# Patient Record
Sex: Female | Born: 1980 | Race: Black or African American | Hispanic: No | Marital: Married | Smoking: Never smoker
Health system: Southern US, Community
[De-identification: ages and names within clinical notes are randomized; demographics above are authoritative.]

## PROBLEM LIST (undated history)

## (undated) ENCOUNTER — Inpatient Hospital Stay (HOSPITAL_COMMUNITY): Payer: Self-pay

## (undated) DIAGNOSIS — Z21 Asymptomatic human immunodeficiency virus [HIV] infection status: Secondary | ICD-10-CM

## (undated) DIAGNOSIS — B20 Human immunodeficiency virus [HIV] disease: Secondary | ICD-10-CM

## (undated) HISTORY — PX: CIRCUMCISION: SUR203

---

## 2016-02-14 ENCOUNTER — Encounter: Payer: Self-pay | Admitting: *Deleted

## 2016-02-21 ENCOUNTER — Encounter: Payer: Self-pay | Admitting: Obstetrics & Gynecology

## 2016-02-21 ENCOUNTER — Other Ambulatory Visit: Payer: Self-pay | Admitting: Obstetrics & Gynecology

## 2016-02-21 ENCOUNTER — Ambulatory Visit (INDEPENDENT_AMBULATORY_CARE_PROVIDER_SITE_OTHER): Payer: Self-pay | Admitting: Obstetrics & Gynecology

## 2016-02-21 VITALS — BP 91/69 | HR 101 | Ht 66.0 in | Wt 196.2 lb

## 2016-02-21 DIAGNOSIS — O09299 Supervision of pregnancy with other poor reproductive or obstetric history, unspecified trimester: Secondary | ICD-10-CM | POA: Insufficient documentation

## 2016-02-21 DIAGNOSIS — Z124 Encounter for screening for malignant neoplasm of cervix: Secondary | ICD-10-CM

## 2016-02-21 DIAGNOSIS — O0992 Supervision of high risk pregnancy, unspecified, second trimester: Secondary | ICD-10-CM

## 2016-02-21 DIAGNOSIS — O34219 Maternal care for unspecified type scar from previous cesarean delivery: Secondary | ICD-10-CM

## 2016-02-21 DIAGNOSIS — O0990 Supervision of high risk pregnancy, unspecified, unspecified trimester: Secondary | ICD-10-CM | POA: Insufficient documentation

## 2016-02-21 DIAGNOSIS — Z1151 Encounter for screening for human papillomavirus (HPV): Secondary | ICD-10-CM

## 2016-02-21 DIAGNOSIS — Z113 Encounter for screening for infections with a predominantly sexual mode of transmission: Secondary | ICD-10-CM

## 2016-02-21 DIAGNOSIS — O09529 Supervision of elderly multigravida, unspecified trimester: Secondary | ICD-10-CM | POA: Insufficient documentation

## 2016-02-21 DIAGNOSIS — Z3492 Encounter for supervision of normal pregnancy, unspecified, second trimester: Secondary | ICD-10-CM

## 2016-02-21 DIAGNOSIS — O09292 Supervision of pregnancy with other poor reproductive or obstetric history, second trimester: Secondary | ICD-10-CM

## 2016-02-21 DIAGNOSIS — O09522 Supervision of elderly multigravida, second trimester: Secondary | ICD-10-CM

## 2016-02-21 DIAGNOSIS — Z3482 Encounter for supervision of other normal pregnancy, second trimester: Secondary | ICD-10-CM

## 2016-02-21 LAB — POCT URINALYSIS DIP (DEVICE)
BILIRUBIN URINE: NEGATIVE
GLUCOSE, UA: NEGATIVE mg/dL
Hgb urine dipstick: NEGATIVE
KETONES UR: NEGATIVE mg/dL
Leukocytes, UA: NEGATIVE
NITRITE: NEGATIVE
Protein, ur: NEGATIVE mg/dL
SPECIFIC GRAVITY, URINE: 1.01 (ref 1.005–1.030)
Urobilinogen, UA: 0.2 mg/dL (ref 0.0–1.0)
pH: 7 (ref 5.0–8.0)

## 2016-02-21 NOTE — Progress Notes (Signed)
  Subjective: 3 previous cesarean sections    Terri Padilla is a G5P3010 5256w5d being seen today for her first obstetrical visit.  Her obstetrical history is significant for advanced maternal age and previous cesarean sections, child with Down syndrome. Patient does intend to breast feed. Pregnancy history fully reviewed.  Patient reports no complaints.  Vitals:   02/21/16 1504 02/21/16 1505  BP: 91/69   Pulse: (!) 101   Weight: 196 lb 3.2 oz (89 kg)   Height:  5\' 6"  (1.676 m)    HISTORY: OB History  Gravida Para Term Preterm AB Living  5 3 3   1     SAB TAB Ectopic Multiple Live Births  1            # Outcome Date GA Lbr Len/2nd Weight Sex Delivery Anes PTL Lv  5 Current           4 Term 04/05/15 8024w2d  6 lb 9.8 oz (3 kg) F CS-Unspec        Birth Comments: had a problem with abdomen during pregnancy, baby born with undiagnosed down's syndrome  3 Term 04/15/13 6750w0d  6 lb 6.3 oz (2.9 kg) F CS-Unspec        Birth Comments: no complications  2 Term 10/21/08 [redacted]w[redacted]d  7 lb 7.9 oz (3.4 kg) M CS-Unspec        Birth Comments: no complications except c/s for ftp, and bleeding  1 SAB 2009             No past medical history on file. Past Surgical History:  Procedure Laterality Date  . CESAREAN SECTION     No family history on file.   Exam    Uterus:     Pelvic Exam:    Perineum: No Hemorrhoids   Vulva: normal   Vagina:  normal mucosa   pH:     Cervix: no lesions   Adnexa: not evaluated   Bony Pelvis: average  System: Breast:  normal appearance, no masses or tenderness   Skin: normal coloration and turgor, no rashes    Neurologic: oriented, normal mood   Extremities: normal strength, tone, and muscle mass   HEENT neck supple with midline trachea   Mouth/Teeth Has dental caries   Neck supple   Cardiovascular: regular rate and rhythm, no murmurs or gallops   Respiratory:  appears well, vitals normal, no respiratory distress, acyanotic, normal RR, neck free of  mass or lymphadenopathy, chest clear, no wheezing, crepitations, rhonchi, normal symmetric air entry   Abdomen: soft, non-tender; bowel sounds normal; no masses,  no organomegaly   Urinary: urethral meatus normal      Assessment:    Pregnancy: [id number] Patient Active Problem List   Diagnosis Date Noted  . Supervision of high risk pregnancy, antepartum 02/21/2016  . Multigravida of advanced maternal age 35/04/2016  . Previous cesarean delivery affecting pregnancy, antepartum 02/21/2016  . Down syndrome in child of prior pregnancy, currently pregnant 02/21/2016        Plan:     Initial labs drawn. Prenatal vitamins. Problem list reviewed and updated. Genetic Screening discussed Quad Screen: needs US first, considering genetic testing.  Ultrasound discussed; fetal survey: ordered.  Follow up in 4 weeks. 50% of 30 min visit spent on counseling and coordination of care.  [place] be leaving for [place] 2 months   Makenzey Nanni 02/21/2016

## 2016-02-21 NOTE — Patient Instructions (Signed)

## 2016-02-21 NOTE — Progress Notes (Signed)
Here for first ob visit. Used pacifica interpreter [phone]#[id number]. Given booklets. C/o cramping sometimes.

## 2016-02-22 ENCOUNTER — Telehealth: Payer: Self-pay | Admitting: Infectious Disease

## 2016-02-22 LAB — AFP, QUAD SCREEN
AFP: 67 ng/mL
Age Alone: 1:137 {titer}
CURR GEST AGE: 21.7 wk
Down Syndrome Scr Risk Est: 1:25800 {titer}
HCG, Total: 3.43 IU/mL
INH: 47.2 pg/mL
INTERPRETATION-AFP: NEGATIVE
MOM FOR HCG: 0.2
MoM for AFP: 1.09
MoM for INH: 0.25
OPEN SPINA BIFIDA: NEGATIVE
TRI 18 SCR RISK EST: NEGATIVE
Trisomy 18 (Edward) Syndrome Interp.: 1:326 {titer}
UE3 VALUE: 2.59 ng/mL
uE3 Mom: 1.13

## 2016-02-22 LAB — PRENATAL PROFILE (SOLSTAS)
ANTIBODY SCREEN: NEGATIVE
BASOS ABS: 0 {cells}/uL (ref 0–200)
BASOS PCT: 0 %
EOS ABS: 87 {cells}/uL (ref 15–500)
Eosinophils Relative: 1 %
HEMATOCRIT: 32.2 % — AB (ref 35.0–45.0)
HIV 1&2 Ab, 4th Generation: REACTIVE — AB
Hemoglobin: 10.8 g/dL — ABNORMAL LOW (ref 11.7–15.5)
Hepatitis B Surface Ag: NEGATIVE
Lymphocytes Relative: 21 %
Lymphs Abs: 1827 cells/uL (ref 850–3900)
MCH: 30.3 pg (ref 27.0–33.0)
MCHC: 33.5 g/dL (ref 32.0–36.0)
MCV: 90.2 fL (ref 80.0–100.0)
MONO ABS: 435 {cells}/uL (ref 200–950)
MPV: 10.4 fL (ref 7.5–12.5)
Monocytes Relative: 5 %
NEUTROS ABS: 6351 {cells}/uL (ref 1500–7800)
Neutrophils Relative %: 73 %
PLATELETS: 270 10*3/uL (ref 140–400)
RBC: 3.57 MIL/uL — ABNORMAL LOW (ref 3.80–5.10)
RDW: 15.1 % — ABNORMAL HIGH (ref 11.0–15.0)
RH TYPE: POSITIVE
Rubella: 2.34 Index — ABNORMAL HIGH (ref <0.90)
WBC: 8.7 10*3/uL (ref 3.8–10.8)

## 2016-02-22 LAB — HIV 1/2 CONFIRMATION
HIV 1 ANTIBODY: POSITIVE — AB
HIV 2 AB: NEGATIVE

## 2016-02-22 LAB — GC/CHLAMYDIA PROBE AMP (~~LOC~~) NOT AT ARMC
Chlamydia: NEGATIVE
Neisseria Gonorrhea: NEGATIVE

## 2016-02-22 LAB — CYTOLOGY - PAP

## 2016-02-22 LAB — GLUCOSE TOLERANCE, 1 HOUR (50G) W/O FASTING: Glucose, 1 Hr, gestational: 145 mg/dL — ABNORMAL HIGH (ref <140)

## 2016-02-22 NOTE — Progress Notes (Signed)
   This patient is positive for HIV and apparently pregnant. She should be plugged into our clinic as soon as possible to be started on antiretroviral medications.

## 2016-02-23 LAB — PAIN MGMT, PROFILE 6 CONF W/O MM, U
6 ACETYLMORPHINE: NEGATIVE ng/mL (ref <10)
AMPHETAMINES: NEGATIVE ng/mL (ref <500)
Alcohol Metabolites: NEGATIVE ng/mL (ref <500)
BARBITURATES: NEGATIVE ng/mL (ref <300)
Benzodiazepines: NEGATIVE ng/mL (ref <100)
Cocaine Metabolite: NEGATIVE ng/mL (ref <150)
Creatinine: 51.4 mg/dL (ref >=20.0)
Marijuana Metabolite: NEGATIVE ng/mL (ref <20)
Methadone Metabolite: NEGATIVE ng/mL (ref <100)
OXIDANT: NEGATIVE ug/mL (ref <200)
Opiates: NEGATIVE ng/mL (ref <100)
Oxycodone: NEGATIVE ng/mL (ref <100)
PH: 7.66 (ref 4.5–9.0)
Phencyclidine: NEGATIVE ng/mL (ref <25)
Please note:: 0

## 2016-02-23 LAB — CULTURE, OB URINE

## 2016-02-25 ENCOUNTER — Encounter (HOSPITAL_COMMUNITY): Payer: Self-pay

## 2016-02-25 ENCOUNTER — Encounter: Payer: Self-pay | Admitting: Advanced Practice Midwife

## 2016-02-25 ENCOUNTER — Inpatient Hospital Stay (HOSPITAL_COMMUNITY)
Admission: AD | Admit: 2016-02-25 | Discharge: 2016-02-25 | Disposition: A | Discharge status: Home / Self Care | Payer: Self-pay | Source: Ambulatory Visit | Attending: Obstetrics and Gynecology | Admitting: Obstetrics and Gynecology

## 2016-02-25 ENCOUNTER — Encounter (HOSPITAL_COMMUNITY): Payer: Self-pay | Admitting: *Deleted

## 2016-02-25 DIAGNOSIS — O09522 Supervision of elderly multigravida, second trimester: Secondary | ICD-10-CM | POA: Insufficient documentation

## 2016-02-25 DIAGNOSIS — Z3A22 22 weeks gestation of pregnancy: Secondary | ICD-10-CM | POA: Insufficient documentation

## 2016-02-25 DIAGNOSIS — O26899 Other specified pregnancy related conditions, unspecified trimester: Secondary | ICD-10-CM

## 2016-02-25 DIAGNOSIS — A084 Viral intestinal infection, unspecified: Secondary | ICD-10-CM | POA: Insufficient documentation

## 2016-02-25 DIAGNOSIS — O99612 Diseases of the digestive system complicating pregnancy, second trimester: Secondary | ICD-10-CM | POA: Insufficient documentation

## 2016-02-25 DIAGNOSIS — O26892 Other specified pregnancy related conditions, second trimester: Secondary | ICD-10-CM | POA: Insufficient documentation

## 2016-02-25 DIAGNOSIS — R109 Unspecified abdominal pain: Secondary | ICD-10-CM

## 2016-02-25 DIAGNOSIS — Z21 Asymptomatic human immunodeficiency virus [HIV] infection status: Secondary | ICD-10-CM | POA: Insufficient documentation

## 2016-02-25 DIAGNOSIS — O9989 Other specified diseases and conditions complicating pregnancy, childbirth and the puerperium: Secondary | ICD-10-CM

## 2016-02-25 HISTORY — DX: Asymptomatic human immunodeficiency virus (hiv) infection status: Z21

## 2016-02-25 HISTORY — DX: Human immunodeficiency virus (HIV) disease: B20

## 2016-02-25 LAB — HEMOGLOBINOPATHY EVALUATION
HCT: 32.2 % — ABNORMAL LOW (ref 35.0–45.0)
HGB A2 QUANT: 4.9 % — AB (ref 1.8–3.5)
HGB A: 54.8 % — AB (ref >96.0)
HGB F QUANT: 7.2 % — AB (ref <2.0)
Hemoglobin: 10.8 g/dL — ABNORMAL LOW (ref 11.7–15.5)
Hgb S Quant: 33.1 % — ABNORMAL HIGH
MCH: 30.3 pg (ref 27.0–33.0)
MCV: 90.2 fL (ref 80.0–100.0)
RBC: 3.57 MIL/uL — ABNORMAL LOW (ref 3.80–5.10)
RDW: 15.1 % — ABNORMAL HIGH (ref 11.0–15.0)

## 2016-02-25 LAB — URINE MICROSCOPIC-ADD ON: RBC / HPF: NONE SEEN RBC/hpf (ref 0–5)

## 2016-02-25 LAB — WET PREP, GENITAL
CLUE CELLS WET PREP: NONE SEEN
TRICH WET PREP: NONE SEEN
YEAST WET PREP: NONE SEEN

## 2016-02-25 LAB — URINALYSIS, ROUTINE W REFLEX MICROSCOPIC
BILIRUBIN URINE: NEGATIVE
Glucose, UA: NEGATIVE mg/dL
Ketones, ur: NEGATIVE mg/dL
Leukocytes, UA: NEGATIVE
Nitrite: NEGATIVE
PH: 6.5 (ref 5.0–8.0)
Protein, ur: NEGATIVE mg/dL

## 2016-02-25 LAB — POCT FERN TEST: POCT Fern Test: NEGATIVE

## 2016-02-25 MED ORDER — ACETAMINOPHEN 500 MG PO TABS
1000.0000 mg | ORAL_TABLET | Freq: Once | ORAL | Status: AC
  Administered 2016-02-25: 1000 mg via ORAL
  Filled 2016-02-25: qty 2

## 2016-02-25 NOTE — Discharge Instructions (Signed)
Douleur abdominale pendant la grossesse La douleur abdominale est courante pendant la grossesse. La [employer], cela ne cause aucun dommage. Il existe de nombreuses causes de douleurs abdominales. Certaines causes sont plus graves que [place]. Certaines des causes de la douleur abdominale pendant la grossesse sont faciles  diagnostiquer. Parfois, le diagnostic prend du temps  comprendre. D'autres fois, la cause n'est pas dtermine. La douleur abdominale peut signifier que quelque chose est trs faux avec la grossesse, ou la douleur n'a peut-tre rien  voir avec la [place]. Pour cette raison, dites toujours  votre fournisseur de soins de sant si vous souffrez d'un malaise abdominal. INSTRUCTIONS DE Howell Pringle Surveillez vos douleurs abdominales pour [employer]. Les actions suivantes peuvent aider  attnuer les inconvnients que vous rencontrez: Ne pas avoir de rapports sexuels ou mettre quelque chose dans votre vagin jusqu' ce que vos symptmes disparaissent compltement. Rtablissez beaucoup de repos jusqu' ce que votre douleur s'amliore. Buvez des liquides clairs si vous ressentez des nauses. vitez les aliments solides tant que vous tes mal  l'aise ou  la nause. Ne prenez que des [employer] Stores ordonnance ou des [employer] ordonnance, comme indiqu par votre fournisseur de soins de sant. Conservez tous les rendez-vous de suivi avec votre fournisseur de soins de sant. RECHERCHE SOINS MDICAUX IMMDIATE SI: Vous Jeanine Luz du liquide ou passez du tissu du vagin. Vous avez de plus en plus de douleurs ou de crampes. Vous avez des vomissements persistants. Vous avez une urination douloureuse ou sanglante. Tu as de Social research officer, government. Vous remarquez une diminution des mouvements de votre bb. Vous avez une faiblesse extrme ou vous sentez faibles. Vous avez un essoufflement, avec ou sans douleur abdominale. Vous dveloppez un grave mal de tte avec des Praxair. Vous avez des troubles vaginaux anormaux avec des Weyerhaeuser Company. Vous avez une diarrhe persistante. Vous avez des BorgWarner se poursuivent mme aprs le repos, ou s'aggrave. ASSUREZ-VOUS: Comprenez ces instructions. Surveilleront votre tat. Obtenir de Celanese Corporation de suite si vous ne faites pas bien ou s'aggrave.  Cette information n'est pas destine  remplacer les conseils qui vous ont t fournis par votre fournisseur de soins de sant. Assurez-vous de Scientific laboratory technician de vos questions avec votre fournisseur de soins de sant.  Abdominal Pain During Pregnancy Abdominal pain is common in pregnancy. Most of the time, it does not cause harm. There are many causes of abdominal pain. Some causes are more serious than others. Some of the causes of abdominal pain in pregnancy are easily diagnosed. Occasionally, the diagnosis takes time to understand. Other times, the cause is not determined. Abdominal pain can be a sign that something is very wrong with the pregnancy, or the pain may have nothing to do with the pregnancy at all. For this reason, always tell your health care provider if you have any abdominal discomfort. HOME CARE INSTRUCTIONS  Monitor your abdominal pain for any changes. The following actions may help to alleviate any discomfort you are experiencing:  Do not have sexual intercourse or put anything in your vagina until your symptoms go away completely.  Get plenty of rest until your pain improves.  Drink clear fluids if you feel nauseous. Avoid solid food as long as you are uncomfortable or nauseous.  Only take over-the-counter or prescription medicine as directed by your health care provider.  Keep all follow-up appointments with your health care provider. SEEK IMMEDIATE MEDICAL CARE IF:  You are bleeding, leaking fluid, or passing tissue from the vagina.  You have increasing pain or cramping.    You have persistent vomiting.  You have  painful or bloody urination.  You have a fever.  You notice a decrease in your baby's movements.  You have extreme weakness or feel faint.  You have shortness of breath, with or without abdominal pain.  You develop a severe headache with abdominal pain.  You have abnormal vaginal discharge with abdominal pain.  You have persistent diarrhea.  You have abdominal pain that continues even after rest, or gets worse. MAKE SURE YOU:   Understand these instructions.  Will watch your condition.  Will get help right away if you are not doing well or get worse.   This information is not intended to replace advice given to you by your health care provider. Make sure you discuss any questions you have with your health care provider.   Document Released: 06/30/2005 Document Revised: 04/20/2013 Document Reviewed: 01/27/2013 Elsevier Interactive Patient Education 2016 [employer].   Gastro-entrite Virale La gastro-entrite virale est galement connue sous le nom de grippe gastrique. Cette condition affecte l'estomac et le tractus intestinal. Il peut provoquer une diarrhe soudaine et des vomissements. La maladie dure gnralement de 3  8 jours. La plupart des gens dveloppent une rponse immunitaire qui finit par se Engineering geologist virus. Bien que cette rponse naturelle se dveloppe, le virus peut vous rendre trs [place]. CAUSES De nombreux virus diffrents peuvent causer une gastro-entrite, comme le rotavirus ou les norovirus. Vous pouvez attraper un de ces virus en consommant des aliments ou de l'eau contamins. Vous pouvez galement attraper un virus en partageant des Consolidated Edison ou d'autres objets personnels avec une personne infecte ou en touchant une surface contamine. SYMPTMES Les symptmes les plus courants sont la diarrhe et les vomissements. Ces problmes peuvent entraner une perte grave de liquides corporels (dshydratation) et un dsquilibre du sel corporel (lectrolyte).  D'autres symptmes peuvent inclure: Fivre. Mal de tte. Fatigue. Douleur abdominale. DIAGNOSTIC Votre soignant peut gnralement diagnostiquer une gastro-entrite virale en fonction de vos symptmes et Omnicare. Un chantillon de selles peut galement tre utilis pour tester la prsence de virus ou d'autres infections. TRAITEMENT Cette maladie disparat gnralement seule. Les traitements visent la rhydratation. Les cas les plus graves de gastro-entrite virale impliquent des vomissements si svres que vous ne pouvez pas Fortune Brands liquides. Dans ces cas, les fluides doivent tre administrs par Humana Inc (IV). INSTRUCTIONS DE SOIN Buvez suffisamment de liquides pour garder votre urine claire ou jaune ple. Buvez de petites quantits de liquides frquemment et State Farm quantits tolres. Demandez  votre fournisseur de Clear Channel Communications de rhydratation spcifiques. viter: Engineer, building services. De l'alcool. Boissons gazeuses. Le tabac. Jus. Boissons  la cafine. Des fluides extrmement chauds ou froids. Aliments graisseux et gras. Trop d'ingestion de tout en Performance Food Group. Produits laitiers jusqu' 24  48 heures aprs la cessation de la diarrhe. Vous pouvez consommer des probiotiques. Les probiotiques NVR Inc cultures actives de bactries bnfiques. Ils peuvent diminuer la quantit et le nombre de selles diarrhiques Pathmark Stores. Les probiotiques peuvent tre trouvs American Electric Power des cultures actives et des supplments. Lavez-vous bien les mains pour viter de Ryder System virus. Prenez uniquement des Path[employer] Stores ordonnance ou de prescription pour Liz Claiborne, l'inconfort ou la fivre selon les directives de votre soignant. Ne pas donner d'aspirine CMS Energy Corporation. Les mdicaments antidiarrhiques ne sont pas recommands. Demandez  votre soignant si vous devriez continuer  prendre vos mdicaments rguliers et en  vente  libre. Conservez tous les rendez-vous de suivi comme indiqu par Genuine Partsvotre soignant. RECHERCHE SOINS MDICAUX IMMDIATE SI: Vous ne parvenez pas  rduire les liquides. Vous ne faites pas uriner au moins une fois toutes les 6  8 heures. Vous dveloppez un essoufflement. Vous remarquez du sang dans vos selles ou vomissez. Cela peut ressembler  du caf. Vous avez une douleur abdominale qui augmente ou est concentre dans une petite zone (localise). Vous avez des vomissements persistants ou de la diarrhe. Tu as de Social research officer, governmentla fivre. Le patient est un enfant de moins de 3 mois et il a une fivre. Le patient est un enfant de plus de 3 mois et il a une fivre et des Halliburton Companysymptmes persistants. Le patient est un enfant de plus de 3 mois, et il a une fivre et les symptmes s'aggravent soudainement. Le patient est un bb, et il n'a pas de larmes en pleurant. ASSUREZ-VOUS: Comprenez ces instructions. Surveilleront votre tat. Obtenir de Celanese Corporationl'aide tout de suite si vous ne faites pas bien ou s'aggrave.   Viral Gastroenteritis Viral gastroenteritis is also known as stomach flu. This condition affects the stomach and intestinal tract. It can cause sudden diarrhea and vomiting. The illness typically lasts 3 to 8 days. Most people develop an immune response that eventually gets rid of the virus. While this natural response develops, the virus can make you quite ill. CAUSES  Many different viruses can cause gastroenteritis, such as rotavirus or noroviruses. You can catch one of these viruses by consuming contaminated food or water. You may also catch a virus by sharing utensils or other personal items with an infected person or by touching a contaminated surface. SYMPTOMS  The most common symptoms are diarrhea and vomiting. These problems can cause a severe loss of body fluids (dehydration) and a body salt (electrolyte) imbalance. Other symptoms may include:  Fever.  Headache.  Fatigue.  Abdominal pain. DIAGNOSIS   Your caregiver can usually diagnose viral gastroenteritis based on your symptoms and a physical exam. A stool sample may also be taken to test for the presence of viruses or other infections. TREATMENT  This illness typically goes away on its own. Treatments are aimed at rehydration. The most serious cases of viral gastroenteritis involve vomiting so severely that you are not able to keep fluids down. In these cases, fluids must be given through an intravenous line (IV). HOME CARE INSTRUCTIONS   Drink enough fluids to keep your urine clear or pale yellow. Drink small amounts of fluids frequently and increase the amounts as tolerated.  Ask your caregiver for specific rehydration instructions.  Avoid:  Foods high in sugar.  Alcohol.  Carbonated drinks.  Tobacco.  Juice.  Caffeine drinks.  Extremely hot or cold fluids.  Fatty, greasy foods.  Too much intake of anything at one time.  Dairy products until 24 to 48 hours after diarrhea stops.  You may consume probiotics. Probiotics are active cultures of beneficial bacteria. They may lessen the amount and number of diarrheal stools in adults. Probiotics can be found in yogurt with active cultures and in supplements.  Wash your hands well to avoid spreading the virus.  Only take over-the-counter or prescription medicines for pain, discomfort, or fever as directed by your caregiver. Do not give aspirin to children. Antidiarrheal medicines are not recommended.  Ask your caregiver if you should continue to take your regular prescribed and over-the-counter medicines.  Keep all follow-up appointments as directed by your caregiver. SEEK IMMEDIATE MEDICAL CARE IF:  You are unable to keep fluids down.  You do not urinate at least once every 6 to 8 hours.  You develop shortness of breath.  You notice blood in your stool or vomit. This may look like coffee grounds.  You have abdominal pain that increases or is concentrated in one  small area (localized).  You have persistent vomiting or diarrhea.  You have a fever.  The patient is a child younger than 3 months, and he or she has a fever.  The patient is a child older than 3 months, and he or she has a fever and persistent symptoms.  The patient is a child older than 3 months, and he or she has a fever and symptoms suddenly get worse.  The patient is a baby, and he or she has no tears when crying. MAKE SURE YOU:   Understand these instructions.  Will watch your condition.  Will get help right away if you are not doing well or get worse.   This information is not intended to replace advice given to you by your health care provider. Make sure you discuss any questions you have with your health care provider.   Document Released: 06/30/2005 Document Revised: 09/22/2011 Document Reviewed: 04/16/2011 Elsevier Interactive Patient Education Yahoo[employer] Inc2016 Elsevier Inc.

## 2016-02-25 NOTE — MAU Note (Signed)
Patient states she was awakened by abdominal pain around 0400 with contractions about every 10 minutes.  Denies LOF or vaginal bleeding.  States she has had 3 prior cesarean sections.  No preterm births, no problems with this pregnancy.

## 2016-02-25 NOTE — MAU Provider Note (Signed)
Chief Complaint: Contractions   None     SUBJECTIVE HPI: Terri Padilla is a 35 y.o. [id number] pt of [facility] at 416w2d by LMP with hx significant for C/S x 3 at term and HIV positive who presents to maternity admissions reporting onset of cramping/contractions at 4 am that were 5 minutes, then 3 minutes, then 1 minute apart and progressively more painful. The contractions were associated with some clear fluid leakage, not requiring a pad.  She reports she also had 3-4 episodes of diarrhea yesterday/last night.  The contractions have improved and are 30 minutes apart now while in MAU. She did not try any treatment.  She denies sick contacts.   She denies vaginal bleeding, vaginal itching/burning, urinary symptoms, h/a, dizziness, n/v, or fever/chills.    Pacifica interpreter [place] language used for all communication.  HPI  Past Medical History:  Diagnosis Date  . HIV (human immunodeficiency virus infection) (HCC)    Past Surgical History:  Procedure Laterality Date  . CESAREAN SECTION     Social History   Social History  . Marital status: Single    Spouse name: N/A  . Number of children: N/A  . Years of education: N/A   Occupational History  . Not on file.   Social History Main Topics  . Smoking status: Never Smoker  . Smokeless tobacco: Never Used  . Alcohol use No  . Drug use: No  . Sexual activity: Yes   Other Topics Concern  . Not on file   Social History Narrative  . No narrative on file   No current facility-administered medications on file prior to encounter.    Current Outpatient Prescriptions on File Prior to Encounter  Medication Sig Dispense Refill  . Multiple Vitamin (MULTIVITAMIN) tablet Take 1 tablet by mouth daily. With iron     No Known Allergies  ROS:  Review of Systems  Constitutional: Negative for chills, fatigue and fever.  Respiratory: Negative for shortness of breath.   Cardiovascular: Negative for chest pain.  Gastrointestinal:  Positive for abdominal pain and diarrhea. Negative for nausea and vomiting.  Genitourinary: Positive for pelvic pain and vaginal discharge. Negative for difficulty urinating, dysuria, flank pain, vaginal bleeding and vaginal pain.  Neurological: Negative for dizziness and headaches.  Psychiatric/Behavioral: Negative.      I have reviewed patient's Past Medical Hx, Surgical Hx, Family Hx, Social Hx, medications and allergies.   Physical Exam   Patient Vitals for the past 24 hrs:  BP Temp Temp src Pulse Resp SpO2  02/25/16 0947 119/76 - - 75 16 -  02/25/16 0741 128/74 99 F (37.2 C) Oral 76 16 99 %   Constitutional: Well-developed, well-nourished female in no acute distress.  Cardiovascular: normal rate Respiratory: normal effort GI: Abd soft, non-tender. Pos BS x 4 MS: Extremities nontender, no edema, normal ROM Neurologic: Alert and oriented x 4.  GU: Neg CVAT.  PELVIC EXAM: Cervix pink, visually closed, without lesion, scant white creamy discharge, vaginal walls and external genitalia normal Cervix 0/long/high, firm, posterior  FHT 146 by doppler  LAB RESULTS Results for orders placed or performed during the hospital encounter of 02/25/16 (from the past 24 hour(s))  Urinalysis, Routine w reflex microscopic ([facility])     Status: Abnormal   Collection Time: 02/25/16  7:45 AM  Result Value Ref Range   Color, Urine YELLOW YELLOW   APPearance CLEAR CLEAR   Specific Gravity, Urine <1.005 (L) 1.005 - 1.030   pH 6.5 5.0 - 8.0  Glucose, UA NEGATIVE NEGATIVE mg/dL   Hgb urine dipstick TRACE (A) NEGATIVE   Bilirubin Urine NEGATIVE NEGATIVE   Ketones, ur NEGATIVE NEGATIVE mg/dL   Protein, ur NEGATIVE NEGATIVE mg/dL   Nitrite NEGATIVE NEGATIVE   Leukocytes, UA NEGATIVE NEGATIVE  Urine microscopic-add on     Status: Abnormal   Collection Time: 02/25/16  7:45 AM  Result Value Ref Range   Squamous Epithelial / LPF 0-5 (A) NONE SEEN   WBC, UA 0-5 0 - 5 WBC/hpf   RBC / HPF NONE  SEEN 0 - 5 RBC/hpf   Bacteria, UA RARE (A) NONE SEEN  Wet prep, genital     Status: Abnormal   Collection Time: 02/25/16  8:45 AM  Result Value Ref Range   Yeast Wet Prep HPF POC NONE SEEN NONE SEEN   Trich, Wet Prep NONE SEEN NONE SEEN   Clue Cells Wet Prep HPF POC NONE SEEN NONE SEEN   WBC, Wet Prep HPF POC FEW (A) NONE SEEN   Sperm PRESENT   Fern Test     Status: None   Collection Time: 02/25/16  8:45 AM  Result Value Ref Range   POCT Fern Test Negative = intact amniotic membranes     O/POS/-- (08/10 1551)  IMAGING No results found.  MAU Management/MDM: Ordered labs and reviewed results.  With no evidence of preterm labor and normal FHT by doppler, will treat conservatively as gastroenteritis.  Pt to increase PO fluids, take Imodium OTC as needed. [facility]  Treatments in MAU included Tylenol 1000 mg PO x 1 dose with some improvement in pain. Pt stable at time of discharge.  ASSESSMENT 1. Abdominal pain complicating pregnancy   2. Viral gastroenteritis     PLAN Discharge home   Medication List    TAKE these medications   multivitamin tablet Take 1 tablet by mouth daily. With iron      Follow-up Information    [facility]   Specialty:  Obstetrics and Gynecology Why:  Comme prvu, retournez  MAU au besoin pour [employer] Technologiesles urgences.  As scheduled, return to MAU as needed for emergencies. Contact information: [address] [place] [id number] [phone]          Sharen CounterLisa Leftwich-Kirby Certified Nurse-Midwife 02/25/2016  3:21 PM

## 2016-02-26 ENCOUNTER — Ambulatory Visit (HOSPITAL_COMMUNITY)
Admission: RE | Admit: 2016-02-26 | Discharge: 2016-02-26 | Disposition: A | Discharge status: Home / Self Care | Payer: Self-pay | Source: Ambulatory Visit | Attending: Obstetrics & Gynecology | Admitting: Obstetrics & Gynecology

## 2016-02-26 ENCOUNTER — Telehealth: Payer: Self-pay | Admitting: *Deleted

## 2016-02-26 ENCOUNTER — Other Ambulatory Visit: Payer: Self-pay | Admitting: Obstetrics & Gynecology

## 2016-02-26 ENCOUNTER — Encounter (HOSPITAL_COMMUNITY): Payer: Self-pay

## 2016-02-26 DIAGNOSIS — O98719 Human immunodeficiency virus [HIV] disease complicating pregnancy, unspecified trimester: Secondary | ICD-10-CM

## 2016-02-26 DIAGNOSIS — O98712 Human immunodeficiency virus [HIV] disease complicating pregnancy, second trimester: Secondary | ICD-10-CM

## 2016-02-26 DIAGNOSIS — Z3492 Encounter for supervision of normal pregnancy, unspecified, second trimester: Secondary | ICD-10-CM

## 2016-02-26 DIAGNOSIS — O09522 Supervision of elderly multigravida, second trimester: Secondary | ICD-10-CM

## 2016-02-26 DIAGNOSIS — O09292 Supervision of pregnancy with other poor reproductive or obstetric history, second trimester: Secondary | ICD-10-CM

## 2016-02-26 DIAGNOSIS — Z3A21 21 weeks gestation of pregnancy: Secondary | ICD-10-CM

## 2016-02-26 DIAGNOSIS — O09529 Supervision of elderly multigravida, unspecified trimester: Secondary | ICD-10-CM

## 2016-02-26 LAB — GC/CHLAMYDIA PROBE AMP (~~LOC~~) NOT AT ARMC
CHLAMYDIA, DNA PROBE: NEGATIVE
NEISSERIA GONORRHEA: NEGATIVE

## 2016-02-26 NOTE — Progress Notes (Signed)
Genetic Counseling  High-Risk Gestation Note  Appointment Date:  02/26/2016 Referred By: Adam PhenixArnold, James G, MD Date of Birth:  09/28/1980   Pregnancy History: [id number] Estimated Date of Delivery: 06/28/16 Estimated Gestational Age: 1865w3d Attending: Particia NearingMartha Decker, MD   Ms. Terri PascalJemillah Padilla and her husband were seen for genetic counseling because of a maternal age of 35 y.o..  Language Resources English/French interpreter, Occupational hygienistCelestine, provided interpretation for today's visit.    In summary:  Discussed AMA and associated risk for fetal aneuploidy  Reviewed family history concerns  Couple's previous daughter with trisomy 6821  Family history sickle cell disease  Discussed options for screening  NIPS-declined  Ultrasound-performed today, within normal limits  Discussed diagnostic testing options  Amniocentesis- declined  They were counseled regarding maternal age and the association with risk for chromosome conditions due to nondisjunction with aging of the ova.   We reviewed chromosomes, nondisjunction, and the associated 1 in 141 risk for fetal aneuploidy related to a maternal age of 35 y.o. at 9565w3d gestation. They were counseled that the risk for aneuploidy decreases as gestational age increases, accounting for those pregnancies which spontaneously abort.  We specifically discussed Down syndrome (trisomy 821), trisomies 6313 and 3918, and sex chromosome aneuploidies (47,XXX and 47,XXY) including the common features and prognoses of each.   Both family histories were reviewed and found to be contributory for Down syndrome in the couple's youngest child. Their daughter is 2111 months old, and the patient reported that she was diagnosed postnatally with Trisomy 2621. [place] was [place], and we do not have confirmation of this report. No additional relatives were reported with Down syndrome. The patient reported a paternal first cousin with trisomy 2613 who died at age 90 days. No relatives  were reported with recurrent miscarriage.  We reviewed that literature suggests that the risk of recurrence for a homo/heterotrisomy depends on the maternal age at the index pregnancy, the maternal age at the time of the subsequent pregnancy, and the specific trisomy [place] 2004).  Considering that Ms. Padilla was >35 years old at the time of delivery with her daughter and that she is currently 35 y.o., her adjusted risk for Down syndrome (homotrisomy) in the current pregnancy is ~1 in 160 (1 in 256 x the standard morbidity ratio of 2.1).  The overall risk for any aneuploidy, considering Ms. Padilla's maternal age of 35 and her history of a previous child with Down syndrome, is estimated to be approximately 1.2%.   The patient previously had Quad screening which was within normal range, reducing the risks for Down syndrome from her age related risk to approximately 1 in 25,800. The screen was also within normal range for trisomy 18 (1 in 326) and open neural tube defects (1 in 9860). Screening adjusts the a priori risk to provide a pregnancy specific estimate. It is not diagnostic.    We reviewed available screening options including noninvasive prenatal screening (NIPS)/cell free DNA (cfDNA) screening, and detailed ultrasound. They were counseled that screening tests are used to modify a patient's a priori risk for aneuploidy, typically based on age. This estimate provides a pregnancy specific risk assessment. We reviewed the benefits and limitations of each option. Specifically, we discussed the conditions for which each test screens, the detection rates, and false positive rates of each. They were also counseled regarding diagnostic testing via amniocentesis. We reviewed the approximate 1 in 300-500 risk for complications from amniocentesis, including spontaneous pregnancy loss. We discussed the possible results that the tests  might provide including: positive, negative, unanticipated,  and no result. Finally, they were counseled regarding the cost of each option and potential out of pocket expenses.   Detailed ultrasound was performed today. Visualized fetal anatomy appeared within normal range. Complete ultrasound results reported separately. After consideration of all the options, they declined NIPS and amniocentesis. They understand that screening tests cannot rule out all birth defects or genetic syndromes and stated that they were comfortable with the risk assessment thus far. The patient was advised of this limitation and states she still does not want additional testing at this time.     Family histories were otherwise contributory for a full sibling to the patient who died at 52 years of age due to sickle cell anemia (Hemoglobin SS disease). She reported that her parents are each hemoglobin AS and that she personally is hemoglobin AA. We reviewed the autosomal recessive inheritance of hemoglobinopathies. We discussed that a pregnancy is at risk to inherit a hemoglobinopathy when both parents are carriers of sickle cell trait. When both parents carry Hb AS or other beta globin chain variant, each pregnancy has a 1 in 4 (25)% chance to inherit the hemoglobinopathy. If one parent is a carrier but the other has typical hemoglobin, then the pregnancy would have a 1 in 2 (50%) chance to be a carrier but not be at increased risk for the condition. The father of the pregnancy does not have a family history of sickle cell disease or sickle cell trait, and consanguinity was denied for the couple.   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 We attempted to contact the patient to further discuss this information but have not been successful thus far. It is not known if the father of the pregnancy has had carrier screening. This is available to him via  hemoglobin electrophoresis, if desired. If both parents are identified to be carriers, prenatally diagnosis would be available via amniocentesis. The couple previously expressed that they would not be interested in a prenatal diagnostic procedure for chromosome or genetic conditions given the associated risk for complications. Prior to carrier screening for the father of the pregnancy, his risk to have sickle cell trait would be up to approximately 1 in 6. Thus, the risk for the pregnancy together, prior to screening for him, would be approximately 1 in 24. The couple's three previous reportedly unaffected children together would theoretically reduce this risk. We discussed during today's visit that sickle cell and other hemoglobin variants are included on the newborn screening panel for babies born in the hospital in the [place].    The patient also reported a paternal half-nephew to the father of the pregnancy with intellectual disability. He is in his 3540's and is married with children. He was described to be slower mentally compared to other relatives. No additional relatives were reported with similar features. She was counseled that there are many different causes of intellectual disabilities including environmental, multifactorial, and genetic etiologies.  We discussed that a specific diagnosis for intellectual disability can be determined in approximately 50% of these individuals.  In the remaining 50% of individuals, a diagnosis may never be determined.  Regarding genetic causes, we discussed that chromosome aberrations (aneuploidy, deletions, duplications, insertions, and translocations) are responsible for a small percentage of individuals with intellectual disability.  Many individuals with chromosome aberrations have additional differences, including congenital anomalies or minor dysmorphisms.  Likewise, single gene conditions are the underlying cause of intellectual delay in some families.  We  discussed that many gene conditions have intellectual disability as a feature, but also often include other physical or medical differences.  Given the reported family history, recurrence risk for the current pregnancy is likely low.  We discussed that without more specific information, it is difficult to provide an accurate risk assessment.  Further genetic counseling is warranted if more information is obtained.  Ms. Marijo Quizon denied exposure to environmental toxins or chemical agents. She denied the use of alcohol, tobacco or street drugs. She denied significant viral illnesses during the course of her pregnancy.   I counseled this couple regarding the above risks and available options.  The approximate face-to-face time with the genetic counselor was 45 minutes.  Quinn Plowman, MS,  Certified Genetic Counselor 02/26/2016

## 2016-02-26 NOTE — Telephone Encounter (Addendum)
Per message from Dr. Daiva EvesVan Dam in Infectious Diseases patient new ob labs HIV test came back positive. Needs referral to [facility] D asap to get started on antiretrovirals.  Have asked registars to get patient into clinic asap so one of our providers can discuss these results to her. Have also put in referral to ID , I called to make appointment - but office closed for lunch, will need to call back later today.  I have already sent the referral to ID.

## 2016-02-28 ENCOUNTER — Encounter: Payer: Self-pay | Admitting: Obstetrics & Gynecology

## 2016-02-28 ENCOUNTER — Telehealth (HOSPITAL_COMMUNITY): Payer: Self-pay | Admitting: MS"

## 2016-02-28 DIAGNOSIS — Z3A22 22 weeks gestation of pregnancy: Secondary | ICD-10-CM | POA: Insufficient documentation

## 2016-02-28 NOTE — Telephone Encounter (Signed)
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 Left message through French/English interpreter [phone] via [facility] [employer] for patient to return call.   Terri BraunKaren Daniele Yankowski 02/28/2016 9:57 AM

## 2016-02-29 NOTE — Telephone Encounter (Addendum)
I called Billie RuddyJamilah with [employer] [phone] and notified her of appointment Monday at 1:20. She voices understanding.   I also called [facility] at [phone] and left a message I am calling with a referral - we are closed until Monday - please call us Monday and ask for Bonita QuinLinda or Tresa EndoKelly re: referral.

## 2016-03-03 ENCOUNTER — Encounter: Payer: Self-pay | Admitting: Obstetrics and Gynecology

## 2016-03-04 ENCOUNTER — Ambulatory Visit: Payer: Self-pay | Admitting: Pharmacist

## 2016-03-04 ENCOUNTER — Ambulatory Visit: Payer: Self-pay | Admitting: *Deleted

## 2016-03-04 ENCOUNTER — Ambulatory Visit (INDEPENDENT_AMBULATORY_CARE_PROVIDER_SITE_OTHER): Payer: Self-pay | Admitting: Advanced Practice Midwife

## 2016-03-04 ENCOUNTER — Telehealth: Payer: Self-pay

## 2016-03-04 ENCOUNTER — Other Ambulatory Visit (INDEPENDENT_AMBULATORY_CARE_PROVIDER_SITE_OTHER): Payer: Self-pay

## 2016-03-04 ENCOUNTER — Telehealth (HOSPITAL_COMMUNITY): Payer: Self-pay

## 2016-03-04 VITALS — BP 107/53 | HR 86 | Wt 194.3 lb

## 2016-03-04 DIAGNOSIS — Z113 Encounter for screening for infections with a predominantly sexual mode of transmission: Secondary | ICD-10-CM

## 2016-03-04 DIAGNOSIS — B20 Human immunodeficiency virus [HIV] disease: Secondary | ICD-10-CM

## 2016-03-04 DIAGNOSIS — O09522 Supervision of elderly multigravida, second trimester: Secondary | ICD-10-CM

## 2016-03-04 DIAGNOSIS — Z21 Asymptomatic human immunodeficiency virus [HIV] infection status: Secondary | ICD-10-CM

## 2016-03-04 DIAGNOSIS — Z3490 Encounter for supervision of normal pregnancy, unspecified, unspecified trimester: Secondary | ICD-10-CM

## 2016-03-04 DIAGNOSIS — O98712 Human immunodeficiency virus [HIV] disease complicating pregnancy, second trimester: Secondary | ICD-10-CM

## 2016-03-04 DIAGNOSIS — IMO0002 Reserved for concepts with insufficient information to code with codable children: Secondary | ICD-10-CM

## 2016-03-04 DIAGNOSIS — O98719 Human immunodeficiency virus [HIV] disease complicating pregnancy, unspecified trimester: Secondary | ICD-10-CM

## 2016-03-04 DIAGNOSIS — O34219 Maternal care for unspecified type scar from previous cesarean delivery: Secondary | ICD-10-CM

## 2016-03-04 DIAGNOSIS — R896 Abnormal cytological findings in specimens from other organs, systems and tissues: Secondary | ICD-10-CM

## 2016-03-04 DIAGNOSIS — O0992 Supervision of high risk pregnancy, unspecified, second trimester: Secondary | ICD-10-CM

## 2016-03-04 DIAGNOSIS — Z79899 Other long term (current) drug therapy: Secondary | ICD-10-CM

## 2016-03-04 LAB — CBC WITH DIFFERENTIAL/PLATELET
BASOS PCT: 0 %
Basophils Absolute: 0 cells/uL (ref 0–200)
Eosinophils Absolute: 78 cells/uL (ref 15–500)
Eosinophils Relative: 1 %
HCT: 32.6 % — ABNORMAL LOW (ref 35.0–45.0)
Hemoglobin: 10.8 g/dL — ABNORMAL LOW (ref 11.7–15.5)
LYMPHS PCT: 34 %
Lymphs Abs: 2652 cells/uL (ref 850–3900)
MCH: 30.3 pg (ref 27.0–33.0)
MCHC: 33.1 g/dL (ref 32.0–36.0)
MCV: 91.6 fL (ref 80.0–100.0)
MONOS PCT: 7 %
MPV: 10.1 fL (ref 7.5–12.5)
Monocytes Absolute: 546 cells/uL (ref 200–950)
NEUTROS PCT: 58 %
Neutro Abs: 4524 cells/uL (ref 1500–7800)
PLATELETS: 271 10*3/uL (ref 140–400)
RBC: 3.56 MIL/uL — AB (ref 3.80–5.10)
RDW: 15.1 % — AB (ref 11.0–15.0)
WBC: 7.8 10*3/uL (ref 3.8–10.8)

## 2016-03-04 MED ORDER — ATAZANAVIR SULFATE 200 MG PO CAPS: 400.0000 mg | ORAL_CAPSULE | Freq: Every day | ORAL | 8 refills | Status: DC

## 2016-03-04 MED ORDER — RITONAVIR 100 MG PO TABS: 100.0000 mg | ORAL_TABLET | Freq: Every day | ORAL | 11 refills | Status: DC

## 2016-03-04 MED ORDER — EMTRICITABINE-TENOFOVIR AF 200-25 MG PO TABS: 1.0000 | ORAL_TABLET | Freq: Every day | ORAL | 8 refills | Status: DC

## 2016-03-04 NOTE — Telephone Encounter (Signed)
Husband has expressed concern regarding their three children and himself for HIV testing.  I advised patient we should be able to arrange testing for the family through the [facility].    Laurell Josephsammy K Eulla Kochanowski, RN

## 2016-03-04 NOTE — Telephone Encounter (Signed)
[facility] referral int he work que since 02-26-16.  Patient is pregnant and recently diagnosed with HIV. She needs office visit.  I spoke with patient's husband and she can come today for labs an on 03-10-16 for office visit with Dr Daiva EvesVan Dam.   Advised him to call is she is not able to make appointments and expressed the importance of keeping the appointment.   Laurell Josephsammy K Eulalah Rupert, RN

## 2016-03-04 NOTE — Progress Notes (Signed)
Pt's husband requests that he interprets for the pt.  The husband is not present in office but is on pt's speaker phone.  Pt and husband are not wanting to use medical provided interpretation due to the their information wanting to be just between husband and wife.  I advised pt that he would need to come in to sign the waiver of free interpretation form.  Pt's husband stated that he would come in some time this week to sign the form.  Both are aware that an interpreter will have to be present in order to sign the form.

## 2016-03-04 NOTE — Progress Notes (Signed)
  Subjective:  Terri Padilla is a 35 y.o. [id number] at 6361w3d being seen today for ongoing prenatal care.  She is currently monitored for the following issues for this high-risk pregnancy and has Supervision of high risk pregnancy, antepartum; Advanced maternal age in multigravida; Previous cesarean delivery affecting pregnancy, antepartum; Down syndrome in child of prior pregnancy, currently pregnant; and [redacted] weeks gestation of pregnancy on her problem list.  Patient reports no complaints.  Contractions: Not present.  .  Movement: Present. Denies leaking of fluid.   The following portions of the patient's history were reviewed and updated as appropriate: allergies, current medications, past family history, past medical history, past social history, past surgical history and problem list. Problem list updated.  Objective:   Vitals:   03/04/16 0923  BP: (!) 107/53  Pulse: 86  Weight: 194 lb 4.8 oz (88.1 kg)    Fetal Status:     Movement: Present     General:  Alert, oriented and cooperative. Patient is in no acute distress.  Skin: Skin is warm and dry. No rash noted.   Cardiovascular: Normal heart rate noted  Respiratory: Normal respiratory effort, no problems with respiration noted  Abdomen: Soft, gravid, appropriate for gestational age. Pain/Pressure: Present     Pelvic:  Cervical exam deferred        Extremities: Normal range of motion.  Edema: None  Mental Status: Normal mood and affect. Normal behavior. Normal judgment and thought content.   Urinalysis:      Assessment and Plan:  Pregnancy: [id number] at 5961w3d  1. HIV disease affecting pregnancy -ID unable to reach pt/language barrier so appointment made today in office.  Presented results using husband as interpreter on the phone per pt preference.  She declined use of video or language line.  Pt and husband appeared aware of diagnosis but wanted more information.  Appt made with ID next week to discuss treatment.  Discussed  options for husband to be tested including [facility] and resources given to find primary care provider.  2. Supervision of high risk pregnancy, antepartum, second trimester --1 hour glucose result elevated, appt made for 3 hour glucose test  3. Previous cesarean delivery affecting pregnancy, antepartum   4. Abnormal Pap smear of vagina and vaginal HPV --ASCUS with positive HR HPV.  Appt made for colposcopy.  Preterm labor symptoms and general obstetric precautions including but not limited to vaginal bleeding, contractions, leaking of fluid and fetal movement were reviewed in detail with the patient. Please refer to After Visit Summary for other counseling recommendations.  Return for colpo/3 hour GTT.   Hurshel PartyLisa A Leftwich-Kirby, CNM

## 2016-03-04 NOTE — Telephone Encounter (Signed)
I have called ID again to follow up on referral for this patient.. I had to leave a message with Selena BattenKim who handled all referrals. I will follow up later on today to make sure patient has been schedule for an appointment.

## 2016-03-04 NOTE — Patient Instructions (Signed)
[place] Resource Guide °(Revised August 2014) ° ° °Chronic Pain Problems:  °• [facility]:  [phone]  °         Patients need to be referred by their primary care doctor/specialist ° °Insufficient Money for Medicine:  °·         United Way: call "211"   °• MAP Program at [facility] [place] [phone] or [place] [phone] °          ° °No Primary Care Doctor:  °To locate a primary care doctor that accepts your insurance or provides certain services:  °·         [facility]: 832-8000  °·         Physician Referral Service: 1-800-533-3463 ask for “My [place]” °• If no insurance, you need to see if you qualify for GCCN “orange card”, call to set      up appointment for eligibility/enrollment at [phone] or [phone] or visit [place] [facility] ([address], [place] and [address] -[place]) to meet with a GCCN enrollment specialist. ° °Agencies that provide inexpensive (sliding fee scale) medical care:  ° °•    [facility] [place] - [phone] °•    [facility]  -  [facility] [place] [facility] - [phone] °•    [facility] - [phone] °•    [facility] - [phone] °•    [facility] - [phone] °•    [facility] - [phone] °• [facility] - [facility] [place] - [phone]     [phone] °• [facility] [place] - [phone] °• [facility]: [phone]  °• [facility]: [phone]  °• [facility]: [phone]  °• [facility] - 336- [phone] ° ° ° °Medicaid-accepting [place] Providers:  °·         [facility] - [phone] (No Family Planning accepted) °·         [address], 641-2100, Mon-Fri 9am-5pm °·          [facility] - [phone] °• [address], [address], Mon-Thursday 8am-5pm, Fri 8am-noon °• [facility] - [phone] °·         [address], [address], Mon-Fri 7:30am-4:30pm °·         [facility] - [phone] °·         [address] [place] [address], Mon-Fri 8am-5pm  °·        [facility] - [phone] °·         [address] °·         Only accepts [place] Access Medicaid patients after they have their name applied to their card ° °Self Pay (no insurance) in [place]:  °         Sickle Cell Patients:  °• [address], [phone] °[facility]: °• [address], [place] [phone] ° °     [facility] °• [address], [place] [phone] ° ° Family Practice: °• [address], [phone] °           [facility]  °·         [address], [phone] °[facility] °• [address], [phone]) [phone] °          [facility]  °·         [address],  [phone] °       [facility] - [address]  °·         641-2100, Mon-Fri 9am-7pm, Sat 9am-1pm °         [facility] [place] °·         [address] [place] [address], [phone] °         [facility] [place]  °·         [address], [phone] °[facility] [place] °• [address], [place] [phone] °         [facility]  °·         [address] [place] [address], [phone] ° [facility]  °• [address], [phone] °[facility] °• [address], [phone]) [phone] ° Dr. Osei-Bonsu  °·         [address], [place],  [phone] °         [facility]  °·         [address], [phone] °         [facility]  °·           [address], [phone] °         [facility]  °·         [address], [phone], 1st & 3rd Saturday every month, 10am-1pm ° °OTHERS:  [facility]  ([facility] Only)  [phone] (Thursday only) ° °Strategies for finding a Primary Care Provider:  °1) Find a Doctor and Pay Out of Pocket  °Although you won't have to find out who is covered by your insurance plan, it is a good idea to ask around and get recommendations. You will then need to call the office and see if the doctor you have chosen will accept you as a new patient and what types of options they offer for patients who are self-pay. Some doctors offer discounts or will set up payment plans for their patients who do not have insurance, but you will need to ask so you aren't surprised when you get to your appointment.  °2) Contact [facility] - To see if you qualify for “orange card” access to healthcare safety net providers.  Call for appointment for eligibility/enrollment at [phone] or [phone]. (Uninsured, 0-200% FPL, qualifying info)  °Applicants for GCCN are first required to see if they are eligible to enroll in the ACA Marketplace before enrolling in GCCN (and get an exemption if they are not).  °GCCN Criteria for acceptance is:  °? Proof of ACA Marketing exemption - form or documentation  °? Valid photo ID (driver's license, state identification card, passport, home country ID)  °? Proof of [place] residency (e.g. driver’s license, lease/landlord information, pay stubs with address, utility   bill, bank statement, etc.)  ? Proof of income (1040, last year's tax return, W2, 4 current pay stubs, other income proof)  ? Proof of assets (current bank statement + 3 most recent, disability paperwork, life insurance info, tax value on autos, etc.)  3)  Contact Your Local Health Department  Not all health departments have doctors that can see patients for sick visits, but many do, so it is worth a call to see if yours does. If you don't know where your local health department is, you can check in your phone book. The CDC also has a tool to help you locate your state's health department, and many state websites also have listings of all of their local health departments.  4) Find a Walk-in Clinic  If your illness is not likely to be very severe or complicated, you may want to try a walk in clinic. These are popping up all over the country in pharmacies, drugstores, and shopping centers. They're usually staffed by nurse practitioners or physician assistants that have been trained to treat common illnesses and complaints. They're usually fairly quick and inexpensive. However, if you have serious medical issues or chronic medical problems, these are probably not your best option   STD Testing:           [facility] [place]           [address], [place], phone [phone] or [phone]           Monday - Friday, call for an appointment          [facility] [place] [place] [facility]           [address], [place], phone [phone] or [phone]           Monday - Friday, call for an appointment

## 2016-03-04 NOTE — Telephone Encounter (Signed)
Patient has been scheduled for appointment with ID on 03/10/2016 at 9:45. Patient has been given information and directions to her appointment.

## 2016-03-04 NOTE — Progress Notes (Signed)
HPI: Terri Padilla is a 35 y.o. female who presents to the [facility] today as a [facility].  She is pregnant and a new diagnosis of HIV.  Allergies: No Known Allergies  Past Medical History: Past Medical History:  Diagnosis Date  . HIV (human immunodeficiency virus infection) (HCC)     Social History: Social History   Social History  . Marital status: Single    Spouse name: N/A  . Number of children: N/A  . Years of education: N/A   Social History Main Topics  . Smoking status: Never Smoker  . Smokeless tobacco: Never Used  . Alcohol use No  . Drug use: No  . Sexual activity: Yes   Other Topics Concern  . Not on file   Social History Narrative  . No narrative on file    Current Regimen: None  Labs: Hepatitis B Surface Ag (no units)  Date Value  02/21/2016 NEGATIVE    CrCl: CrCl cannot be calculated (No order found.).  Lipids: No results found for: CHOL, TRIG, HDL, CHOLHDL, VLDL, LDLCALC  Assessment: Terri Padilla is here today for labs, [employer] [employer], and medications.  I spoke with Dr. Drue Padilla, and we will start the patient on boosted atazanavir and descovy.  I explained to her how to take this medication with the help of Dr. Drue Padilla (who spoke [place]) and her husband translating.  I instructed her to take all of the medications at dinner -  1 tablet of the descovy, 2 capsules of the atazanavir and 1 tablet of the ritonavir. Her husband expressed concerns for getting himself and their three children tested for HIV. Terri Padilla spoke with the husband, and we will arrange testing through the health department.   Plans: - Descovy 200-25 mg PO once daily - Atazanavir 200 mg capsules - take 2 capsules once daily with food - Ritonavir 100 mg PO once daily with food  Terri Padilla, BS, PharmD Infectious Diseases Clinical Pharmacist [facility] 03/04/2016, 1:23 PM

## 2016-03-04 NOTE — Telephone Encounter (Signed)
[facility] asking for demographic information. Information provided

## 2016-03-05 LAB — URINALYSIS
Bilirubin Urine: NEGATIVE
GLUCOSE, UA: NEGATIVE
HGB URINE DIPSTICK: NEGATIVE
Ketones, ur: NEGATIVE
NITRITE: NEGATIVE
PH: 6.5 (ref 5.0–8.0)
Protein, ur: NEGATIVE
Specific Gravity, Urine: 1.024 (ref 1.001–1.035)

## 2016-03-05 LAB — T-HELPER CELL (CD4) - (RCID CLINIC ONLY)
CD4 T CELL ABS: 910 /uL (ref 400–2700)
CD4 T CELL HELPER: 34 % (ref 33–55)

## 2016-03-05 LAB — COMPLETE METABOLIC PANEL WITH GFR
ALBUMIN: 3.1 g/dL — AB (ref 3.6–5.1)
ALK PHOS: 35 U/L (ref 33–115)
ALT: 10 U/L (ref 6–29)
AST: 11 U/L (ref 10–30)
BILIRUBIN TOTAL: 0.3 mg/dL (ref 0.2–1.2)
BUN: 5 mg/dL — ABNORMAL LOW (ref 7–25)
CO2: 21 mmol/L (ref 20–31)
Calcium: 7.9 mg/dL — ABNORMAL LOW (ref 8.6–10.2)
Chloride: 106 mmol/L (ref 98–110)
Creat: 0.46 mg/dL — ABNORMAL LOW (ref 0.50–1.10)
GFR, Est Non African American: >89 mL/min (ref >=60)
Glucose, Bld: 78 mg/dL (ref 65–99)
POTASSIUM: 4.1 mmol/L (ref 3.5–5.3)
SODIUM: 134 mmol/L — AB (ref 135–146)
TOTAL PROTEIN: 7.4 g/dL (ref 6.1–8.1)

## 2016-03-05 LAB — URINE CYTOLOGY ANCILLARY ONLY
Chlamydia: NEGATIVE
NEISSERIA GONORRHEA: NEGATIVE

## 2016-03-05 LAB — RPR

## 2016-03-05 LAB — HEPATITIS B SURFACE ANTIGEN: HEP B S AG: NEGATIVE

## 2016-03-05 LAB — LIPID PANEL
CHOL/HDL RATIO: 2.1 ratio (ref <=5.0)
Cholesterol: 120 mg/dL — ABNORMAL LOW (ref 125–200)
HDL: 57 mg/dL (ref >=46)
LDL CALC: 24 mg/dL (ref <130)
Triglycerides: 197 mg/dL — ABNORMAL HIGH (ref <150)
VLDL: 39 mg/dL — AB (ref <30)

## 2016-03-05 LAB — HEPATITIS A ANTIBODY, TOTAL: Hep A Total Ab: REACTIVE — AB

## 2016-03-05 LAB — HEPATITIS B SURFACE ANTIBODY,QUALITATIVE: HEP B S AB: NEGATIVE

## 2016-03-05 LAB — HEPATITIS B CORE ANTIBODY, TOTAL: HEP B C TOTAL AB: NONREACTIVE

## 2016-03-05 LAB — HEPATITIS C ANTIBODY: HCV Ab: NEGATIVE

## 2016-03-06 ENCOUNTER — Telehealth: Payer: Self-pay | Admitting: General Practice

## 2016-03-06 ENCOUNTER — Telehealth: Payer: Self-pay

## 2016-03-06 DIAGNOSIS — O2342 Unspecified infection of urinary tract in pregnancy, second trimester: Principal | ICD-10-CM

## 2016-03-06 DIAGNOSIS — B951 Streptococcus, group B, as the cause of diseases classified elsewhere: Secondary | ICD-10-CM

## 2016-03-06 LAB — QUANTIFERON TB GOLD ASSAY (BLOOD)
Interferon Gamma Release Assay: NEGATIVE
MITOGEN-NIL SO: 5.69 [IU]/mL
Quantiferon Nil Value: 0.05 IU/mL
Quantiferon Tb Ag Minus Nil Value: 0 IU/mL

## 2016-03-06 LAB — HIV-1 RNA ULTRAQUANT REFLEX TO GENTYP+
HIV 1 RNA QUANT: 1388 {copies}/mL — AB (ref <20)
HIV-1 RNA QUANT, LOG: 3.14 {Log_copies}/mL — AB (ref <1.30)

## 2016-03-06 MED ORDER — AMPICILLIN 500 MG PO CAPS: 500.0000 mg | ORAL_CAPSULE | Freq: Three times a day (TID) | ORAL | 0 refills | Status: AC

## 2016-03-06 NOTE — Telephone Encounter (Signed)
I contacted Terri Padilla , Social Worker with the [facility] to inform her of a recent referral from [facility].  She is from [place] and is [redacted] weeks pregnant and recently diagnosed with HIV.    She has three children ages, 147, 325 and 6911 months old. (ages may not be exact ) The medical history of the children is not clear when explained by the patient and her husband. They assume the children have been tested but they are not sure.  The two oldest children were born in [place] and the 1611 month old was born in [place].  The 5511 month female was born with Downs syndrome and the Mother states she had lots of testing.  The Mother does not have any medical records of previous OB care and gave no history of the children receiving care while in the [place].    Currently her pregnancy medicaid is pending so we have requested [name] for assistance with HIV medications.  The family will be returning to [place] on March 21, [place].   I spoke with patients husband and he has not been specific with information related to [facility] or the children's medical history.  As for HIV status it is unclear if they were aware prior [facility] or not.  The story seems to change base on information from husband.  The Health Department will follow up with the family and decide if testing the children is necessary.   Laurell Josephsammy K King, RN

## 2016-03-06 NOTE — Telephone Encounter (Signed)
Per Dr Debroah LoopArnold, patient has GBS UTI & needs ampicillin antibiotic sent to pharmacy. Patient also needs 3 hr gtt. Called patient with pacific interpreter [phone], no answer- left message to have patient call us back for results. Patient needs ampicillin 500mg  TID x 7. Attempted to order med but there is no pharmacy in the system

## 2016-03-10 ENCOUNTER — Encounter: Payer: Self-pay | Admitting: Infectious Disease

## 2016-03-10 ENCOUNTER — Ambulatory Visit: Payer: Self-pay | Admitting: Infectious Disease

## 2016-03-11 ENCOUNTER — Other Ambulatory Visit: Payer: Self-pay | Admitting: *Deleted

## 2016-03-11 ENCOUNTER — Telehealth: Payer: Self-pay

## 2016-03-11 DIAGNOSIS — B20 Human immunodeficiency virus [HIV] disease: Secondary | ICD-10-CM

## 2016-03-11 MED ORDER — RITONAVIR 100 MG PO TABS: 100.0000 mg | ORAL_TABLET | Freq: Every day | ORAL | 8 refills | Status: DC

## 2016-03-11 MED ORDER — EMTRICITABINE-TENOFOVIR AF 200-25 MG PO TABS: 1.0000 | ORAL_TABLET | Freq: Every day | ORAL | 8 refills | Status: DC

## 2016-03-11 MED ORDER — ATAZANAVIR SULFATE 200 MG PO CAPS: 400.0000 mg | ORAL_CAPSULE | Freq: Every day | ORAL | 8 refills | Status: DC

## 2016-03-11 MED ORDER — RITONAVIR 100 MG PO TABS: 100.0000 mg | ORAL_TABLET | Freq: Every day | ORAL | 11 refills | Status: DC

## 2016-03-11 NOTE — Addendum Note (Signed)
Addended by: Jennet MaduroESTRIDGE, DENISE D on: 03/11/2016 04:57 PM   Modules accepted: Orders

## 2016-03-11 NOTE — Telephone Encounter (Signed)
Emergency ADAP approved : Case # [id number] I will I inform patient to pick up medications.  Phone call via [employer] Interpreter for french .  Message stated : medications sent to pharmacy . Please pick up today.   Laurell Josephsammy K Tarri Guilfoil, RN

## 2016-03-12 ENCOUNTER — Ambulatory Visit (INDEPENDENT_AMBULATORY_CARE_PROVIDER_SITE_OTHER): Payer: Self-pay | Admitting: Internal Medicine

## 2016-03-12 ENCOUNTER — Ambulatory Visit: Payer: Self-pay | Admitting: *Deleted

## 2016-03-12 ENCOUNTER — Encounter: Payer: Self-pay | Admitting: Internal Medicine

## 2016-03-12 VITALS — BP 112/63 | HR 88 | Temp 98.1°F | Wt 196.0 lb

## 2016-03-12 DIAGNOSIS — R896 Abnormal cytological findings in specimens from other organs, systems and tissues: Secondary | ICD-10-CM

## 2016-03-12 DIAGNOSIS — B20 Human immunodeficiency virus [HIV] disease: Secondary | ICD-10-CM

## 2016-03-12 DIAGNOSIS — K429 Umbilical hernia without obstruction or gangrene: Secondary | ICD-10-CM | POA: Insufficient documentation

## 2016-03-12 DIAGNOSIS — Z3490 Encounter for supervision of normal pregnancy, unspecified, unspecified trimester: Secondary | ICD-10-CM

## 2016-03-12 DIAGNOSIS — Z331 Pregnant state, incidental: Secondary | ICD-10-CM

## 2016-03-12 DIAGNOSIS — N879 Dysplasia of cervix uteri, unspecified: Secondary | ICD-10-CM | POA: Insufficient documentation

## 2016-03-12 DIAGNOSIS — K439 Ventral hernia without obstruction or gangrene: Secondary | ICD-10-CM

## 2016-03-12 DIAGNOSIS — IMO0002 Reserved for concepts with insufficient information to code with codable children: Secondary | ICD-10-CM

## 2016-03-12 DIAGNOSIS — O98719 Human immunodeficiency virus [HIV] disease complicating pregnancy, unspecified trimester: Secondary | ICD-10-CM | POA: Insufficient documentation

## 2016-03-12 LAB — HLA B*5701: HLA-B 5701 W/RFLX HLA-B HIGH: NEGATIVE

## 2016-03-12 MED ORDER — ONDANSETRON 8 MG PO TBDP: 8.0000 mg | ORAL_TABLET | Freq: Three times a day (TID) | ORAL | 0 refills | Status: DC | PRN

## 2016-03-12 NOTE — Progress Notes (Signed)
RFV: hiv + diagnosis during pregnancy  Patient ID: Terri Padilla, female   DOB: 07/10/1981, 35 y.o.   MRN: [id number]  HPI Terri Padilla is a 35yo [place] speaking female originally from [place]. She has been in [place] for the past 5 months. She is [id number] @ 24 wk4d gestation. She is s/p c-section x 3. She has a 507 yo son born (birthed in [place]), 464 yo daughter( birthed in [place]), 6811 month daughter ( trisomy 7521)- (birthed in [place]). She has been in [place] for the past 5 months. She was diagnosed with HIV disease at her prenatal visit this month. CD 4 count of 910/VL 1, 388. ART naive. She started into care on August 1st. She states that she had been taking vitamins ( though not prenatal) thus far. She denies any other sexual contact other than her husband, who has tested negative by antibody, and viral RNA testing. She last tested negative 7 years ago during pregnancy with her son in [place] but is unsure if she had been tested during her daughters' birth. She reports that she had received a blood transfusion during the birth of her 35 year old daughter after her section for anemia in [place]. No illicit drug use or tattoos. She denies sexual violence.  At this visit, I spoke [place] to the patient, while her husband was on the phone- which she and he agreed to disclosing information about testing. I explained to her that her husband's blood test were negative for HIV. We are unable to tell her how long she may have been infected and by what means (? Transfusion). She understood how hiv transmission occurs, how we plan to keep her on HIV treatment from now on especially important during her pregnancy. Also discussed that she will not be able to breastfeed her newborn. Newborn to be on medication for the first month of life.  Her 6611 month old female infant has down's syndrome, with developmental delay, did not require any heart surgery. She had breastfed her daughter until she became pregnant  again.  Other past med hx includes: abd pap screen with ASCUS + HPV. 3 c-section. Left foot surgery for removal of cyst. Right sided abdominal wall hernia since her 3rd c-section.  She has just been approved for [facility] subsidization of HIV medication, plan to start today  Her husband has tested negative, recommended repeat testing in 6 wk. He is interested in tr[place]ada PrEP Her [place] as a Naval architecttruck driver, provides for his family.   Children will be tested by the health department on 8/31  She will return back to [place] on Monday Sep 4 th for up to 2 months, due to visitor visa regulations, with plan to return to follow up with her ob care  No Known Allergies   Outpatient Encounter Prescriptions as of 03/12/2016  Medication Sig  . ampicillin (PRINCIPEN) 500 MG capsule Take 1 capsule (500 mg total) by mouth 3 (three) times daily.  Marland Kitchen. atazanavir (REYATAZ) 200 MG capsule Take 2 capsules (400 mg total) by mouth daily with breakfast.  . emtricitabine-tenofovir AF (DESCOVY) 200-25 MG tablet Take 1 tablet by mouth daily.  . Multiple Vitamin (MULTIVITAMIN) tablet Take 1 tablet by mouth daily. With iron  . ritonavir (NORVIR) 100 MG TABS tablet Take 1 tablet (100 mg total) by mouth daily.  . ondansetron (ZOFRAN ODT) 8 MG disintegrating tablet Take 1 tablet (8 mg total) by mouth every 8 (eight) hours as needed for nausea or vomiting.  No facility-administered encounter medications on file as of 03/12/2016.      Patient Active Problem List   Diagnosis Date Noted  . [redacted] weeks gestation of pregnancy   . Supervision of high risk pregnancy, antepartum 02/21/2016  . Advanced maternal age in multigravida 02/21/2016  . Previous cesarean delivery affecting pregnancy, antepartum 02/21/2016  . Down syndrome in child of prior pregnancy, currently pregnant 02/21/2016     Health Maintenance Due  Topic Date Due  . TETANUS/TDAP  08/15/1999  . INFLUENZA VACCINE  02/12/2016    Social  History  Substance Use Topics  . Smoking status: Never Smoker  . Smokeless tobacco: Never Used  . Alcohol use No   No family history of hiv disease  Review of Systems   Constitutional: Negative for fever, chills, diaphoresis, activity change, appetite change, fatigue and unexpected weight change.  HENT: Negative for congestion, sore throat, rhinorrhea, sneezing, trouble swallowing and sinus pressure.  Eyes: Negative for photophobia and visual disturbance.  Respiratory: Negative for cough, chest tightness, shortness of breath, wheezing and stridor.  Cardiovascular: Negative for chest pain, palpitations and leg swelling.  Gastrointestinal: Negative for nausea, vomiting, abdominal pain, diarrhea, constipation, blood in stool, abdominal distention and anal bleeding.  Genitourinary: Negative for dysuria, hematuria, flank pain and difficulty urinating.  Musculoskeletal: Negative for myalgias, back pain, joint swelling, arthralgias and gait problem.  Skin: Negative for color change, pallor, rash and wound.  Neurological: Negative for dizziness, tremors, weakness and light-headedness.  Hematological: Negative for adenopathy. Does not bruise/bleed easily.  Psychiatric/Behavioral: Negative for behavioral problems, confusion, sleep disturbance, dysphoric mood, decreased concentration and agitation.    Physical Exam   BP 112/63   Pulse 88   Temp 98.1 F (36.7 C) (Oral)   Wt 196 lb (88.9 kg)   LMP 09/22/2015   BMI 31.64 kg/m  Physical Exam  Constitutional:  oriented to person, place, and time. appears well-developed and well-nourished. No distress.  HENT: [place]/AT, PERRLA, no scleral icterus Mouth/Throat: Oropharynx is clear and moist. No oropharyngeal exudate.  Cardiovascular: Normal rate, regular rhythm and normal heart sounds. Exam reveals no gallop and no friction rub.  No murmur heard.  Pulmonary/Chest: Effort normal and breath sounds normal. No respiratory distress.  has no wheezes.   Neck = supple, no nuchal rigidity Abdominal: Soft. Bowel sounds are normal.  exhibits no distension. There is no tenderness. Gravid abdomen. Right sided distension c/w lower abdominal wall hernia Lymphadenopathy: no cervical adenopathy. No axillary adenopathy Neurological: alert and oriented to person, place, and time.  Skin: Skin is warm and dry. No rash noted. No erythema.  Psychiatric: a normal mood and affect.  behavior is normal.   Lab Results  Component Value Date   CD4TCELL 34 03/04/2016   Lab Results  Component Value Date   CD4TABS 910 03/04/2016   Lab Results  Component Value Date   HIV1RNAQUANT 1,388 (H) 03/04/2016   Lab Results  Component Value Date   HEPBSAB NEG 03/04/2016   No results found for: RPR  CBC Lab Results  Component Value Date   WBC 7.8 03/04/2016   RBC 3.56 (L) 03/04/2016   HGB 10.8 (L) 03/04/2016   HCT 32.6 (L) 03/04/2016   PLT 271 03/04/2016   MCV 91.6 03/04/2016   MCH 30.3 03/04/2016   MCHC 33.1 03/04/2016   RDW 15.1 (H) 03/04/2016   LYMPHSABS 2,652 03/04/2016   MONOABS 546 03/04/2016   EOSABS 78 03/04/2016   BASOSABS 0 03/04/2016   BMET Lab Results  Component  Value Date   NA 134 (L) 03/04/2016   K 4.1 03/04/2016   CL 106 03/04/2016   CO2 21 03/04/2016   GLUCOSE 78 03/04/2016   BUN 5 (L) 03/04/2016   CREATININE 0.46 (L) 03/04/2016   CALCIUM 7.9 (L) 03/04/2016   GFRNONAA >89 03/04/2016   GFRAA >89 03/04/2016     Assessment and Plan  HIV disease = plan on starting descovy-atazanavir-norvir. We discussed how to take medication. Taking it daily. Not to miss a day or space out medications for any reason. Plan is for her husband to send medicaitons to [place] as soon as possible near refill time. Genotype is pending. We will change regimen based on genotype results. She is planning to return to [place] in roughly 6-8 wk which would place her at 30-[redacted] wks gestation  Her viral load is not excessively high so that I anticipate her to become  undetectable within the next 1-2 months.  Reinforced that she will not be able to breastfeed her infant and that infant will need to be on medications for 1 month+ after birth to ensure no transmission onto newborn  Gave expectations of side effects, gave rx for zofran to use if needed  Health maintenance = will need to give influenza vaccination and tdap at her next appointment  Abdominal hernia = does not appear to be causing any difficulty  Ascus with HPV = to follow up with women's clinic for colposcopy once she returns  Access to care = still awaiting emergency medicaid  Prenatal care = continue with "prenatal" vitamins for which we have discussed to change from her current vitamin to ones with folic acid  rtc as soon as she gets back from [place] in 6-8wk

## 2016-03-13 ENCOUNTER — Ambulatory Visit: Payer: Self-pay | Admitting: Internal Medicine

## 2016-03-18 ENCOUNTER — Encounter: Payer: Self-pay | Admitting: Licensed Clinical Social Worker

## 2016-03-20 ENCOUNTER — Encounter: Payer: Self-pay | Admitting: Obstetrics and Gynecology

## 2016-03-21 ENCOUNTER — Encounter: Payer: Self-pay | Admitting: Obstetrics and Gynecology

## 2016-03-21 NOTE — Progress Notes (Signed)
Patient did not keep 03/20/2016 colposcopy visit.   Terri Padilla, Jr MD Attending [facility] for [facility] Midwife(Faculty Practice)

## 2016-03-25 ENCOUNTER — Encounter: Payer: Self-pay | Admitting: Obstetrics and Gynecology

## 2016-03-25 NOTE — Progress Notes (Signed)
Patient did not keep 03/25/2016 OBGYN visit  Terri Padilla, Jr MD Attending [facility] Mid[facility](Faculty Practice)

## 2016-03-27 LAB — HIV-1 GENOTYPR PLUS

## 2016-03-30 LAB — HIV-1 GENOTYPE: HIV-1 Genotype: DETECTED — AB

## 2016-04-01 NOTE — Telephone Encounter (Signed)
No call or voicemail to leave a message

## 2016-04-03 NOTE — Progress Notes (Signed)
Meet [name] at the clinic during her visit with Dr Drue SecondSnider. Patient's husband intepreted over the phone and I was happy to offer services at this time. RN and the patient planned a visit to meet in her home. The plan is to meet the patient in her home to ensure she has a clear understanding of her medications prior to her leaving country on 09/05

## 2016-04-03 NOTE — Patient Instructions (Signed)
Please refer to progress note for details of today's visit 

## 2016-04-03 NOTE — Progress Notes (Signed)
Rn attempted to meet the patient at her home. Rn was running late and [name] was unable to reschedule the visit time. Visit was rescheduled for another day and time

## 2016-04-11 NOTE — Telephone Encounter (Signed)
A letter has been sent to patient.

## 2016-05-06 ENCOUNTER — Ambulatory Visit (HOSPITAL_COMMUNITY)
Admission: RE | Admit: 2016-05-06 | Discharge: 2016-05-06 | Disposition: A | Discharge status: Home / Self Care | Payer: Self-pay | Source: Ambulatory Visit | Attending: Obstetrics & Gynecology | Admitting: Obstetrics & Gynecology

## 2016-05-06 ENCOUNTER — Encounter (HOSPITAL_COMMUNITY): Payer: Self-pay

## 2016-05-22 ENCOUNTER — Ambulatory Visit (INDEPENDENT_AMBULATORY_CARE_PROVIDER_SITE_OTHER): Payer: Self-pay | Admitting: Pharmacist

## 2016-05-22 ENCOUNTER — Encounter: Payer: Self-pay | Admitting: Pharmacist

## 2016-05-22 DIAGNOSIS — B20 Human immunodeficiency virus [HIV] disease: Secondary | ICD-10-CM

## 2016-05-22 MED ORDER — ONDANSETRON 8 MG PO TBDP: 8.0000 mg | ORAL_TABLET | Freq: Three times a day (TID) | ORAL | 0 refills | Status: DC | PRN

## 2016-05-22 NOTE — Progress Notes (Signed)
HPI: Terri Padilla is a 35 y.o. female who presents to the [facility] today to pick up her medications.   She is accompanied by her husband and two children. She does not speak [place], but her husband does.  She has been out of the country for two months and has been out of HIV medications for ~ one month.   Allergies: No Known Allergies  Past Medical History: Past Medical History:  Diagnosis Date  . HIV (human immunodeficiency virus infection) (HCC)     Social History: Social History   Social History  . Marital status: Single    Spouse name: N/A  . Number of children: N/A  . Years of education: N/A   Social History Main Topics  . Smoking status: Never Smoker  . Smokeless tobacco: Never Used  . Alcohol use No  . Drug use: No  . Sexual activity: Yes   Other Topics Concern  . Not on file   Social History Narrative  . No narrative on file    Current Regimen: Atazanavir + ritonavir + descovy  Labs: HIV 1 RNA Quant (copies/mL)  Date Value  03/04/2016 1,388 (H)   CD4 T Cell Abs (/uL)  Date Value  03/04/2016 910   Hep B S Ab (no units)  Date Value  03/04/2016 NEG   Hepatitis B Surface Ag (no units)  Date Value  03/04/2016 NEGATIVE   HCV Ab (no units)  Date Value  03/04/2016 NEGATIVE    CrCl: CrCl cannot be calculated (Patient's most recent lab result is older than the maximum 21 days allowed.).  Lipids:    Component Value Date/Time   CHOL 120 (L) 03/04/2016 1450   TRIG 197 (H) 03/04/2016 1450   HDL 57 03/04/2016 1450   CHOLHDL 2.1 03/04/2016 1450   VLDL 39 (H) 03/04/2016 1450   LDLCALC 24 03/04/2016 1450    Assessment: Terri Padilla is here today with her husband and 2 children.  She recently came back from [place] yesterday.  She tells me she has been out of HIV medications for ~1 month. She also tells me that when she was taking them, she had a hard time tolerating them as she was vomiting all of the time.  She said she did have a pill that  made it better and helped her tolerate them.  [facility]. She now has a f/u appointment with Dr. Drue SecondSnider on Monday. I will get a HIV reflex to geno and CD4 count on her today.  I expect that her viral load is still high.  I explained the BIG importance of taking her medications from now on with no missed doses.  She is due ~December 15th. She expressed understanding and so did her husband.   Her husband tells me that the children were all tested and are negative.  He is also negative and had a repeat test today.   Plans: - Continue atazanavir, ritonavir, and descovy - HIV VL with reflex to geno and CD4 count today - F/u with Dr. Drue SecondSnider 11/13 at 4:15pm  [name], BS, PharmD Infectious Diseases Clinical Pharmacist [facility] 05/22/2016, 3:52 PM

## 2016-05-23 LAB — T-HELPER CELL (CD4) - (RCID CLINIC ONLY)
CD4 T CELL ABS: 620 /uL (ref 400–2700)
CD4 T CELL HELPER: 26 % — AB (ref 33–55)

## 2016-05-26 ENCOUNTER — Ambulatory Visit: Payer: Self-pay | Admitting: Internal Medicine

## 2016-05-26 LAB — HIV-1 RNA ULTRAQUANT REFLEX TO GENTYP+
HIV 1 RNA Quant: 74756 copies/mL — ABNORMAL HIGH (ref <20)
HIV-1 RNA Quant, Log: 4.87 Log copies/mL — ABNORMAL HIGH (ref <1.30)

## 2016-05-27 ENCOUNTER — Telehealth: Payer: Self-pay

## 2016-05-27 NOTE — Telephone Encounter (Signed)
Patients husband is calling with concern regarding the interpreter scheduled for his wife today.  She is a relative and they are trying to keep Jemilla's diagnosis a secret.  The relative called their home and said she would be interpreting for her today.   Wife walked into the office today very upset and stated she is afraid of coming this evening with fear the interpreter will show up.  I convinced patient the interpreter would be cancelled and she still requested to be rescheduled.  Patient rescheduled. She is due for C-Section end of December.    Laurell Josephsammy K King, RN

## 2016-05-28 ENCOUNTER — Ambulatory Visit (INDEPENDENT_AMBULATORY_CARE_PROVIDER_SITE_OTHER): Payer: Self-pay | Admitting: Advanced Practice Midwife

## 2016-05-28 VITALS — BP 104/72 | HR 87 | Wt 195.9 lb

## 2016-05-28 DIAGNOSIS — Z113 Encounter for screening for infections with a predominantly sexual mode of transmission: Secondary | ICD-10-CM

## 2016-05-28 DIAGNOSIS — O34219 Maternal care for unspecified type scar from previous cesarean delivery: Secondary | ICD-10-CM

## 2016-05-28 DIAGNOSIS — J00 Acute nasopharyngitis [common cold]: Secondary | ICD-10-CM

## 2016-05-28 DIAGNOSIS — O0990 Supervision of high risk pregnancy, unspecified, unspecified trimester: Secondary | ICD-10-CM

## 2016-05-28 DIAGNOSIS — Z23 Encounter for immunization: Secondary | ICD-10-CM

## 2016-05-28 DIAGNOSIS — O09522 Supervision of elderly multigravida, second trimester: Secondary | ICD-10-CM

## 2016-05-28 DIAGNOSIS — K0889 Other specified disorders of teeth and supporting structures: Secondary | ICD-10-CM

## 2016-05-28 LAB — CBC
HEMATOCRIT: 34.4 % — AB (ref 35.0–45.0)
Hemoglobin: 11.6 g/dL — ABNORMAL LOW (ref 11.7–15.5)
MCH: 28.9 pg (ref 27.0–33.0)
MCHC: 33.7 g/dL (ref 32.0–36.0)
MCV: 85.6 fL (ref 80.0–100.0)
MPV: 11.8 fL (ref 7.5–12.5)
PLATELETS: 299 10*3/uL (ref 140–400)
RBC: 4.02 MIL/uL (ref 3.80–5.10)
RDW: 12.9 % (ref 11.0–15.0)
WBC: 9.4 10*3/uL (ref 3.8–10.8)

## 2016-05-28 LAB — GLUCOSE, CAPILLARY: GLUCOSE-CAPILLARY: 75 mg/dL (ref 65–99)

## 2016-05-28 LAB — OB RESULTS CONSOLE GBS: GBS: POSITIVE

## 2016-05-28 NOTE — Progress Notes (Signed)
Wants to use husband as interpreter- permit signed. Just traveled from [place].  C/o cold like symptoms- sore throat. Never got 3 h r gtt. Did cbg today +75, will do repeat 1 hr gtt due to late gestation. Would like flu shot today, tdap next visit.

## 2016-05-28 NOTE — Progress Notes (Signed)
   PRENATAL VISIT NOTE  Subjective:  Terri Padilla is a 35 y.o. [id number] at 7322w4d being seen today for ongoing prenatal care.  She is currently monitored for the following issues for this high-risk pregnancy and has Supervision of high risk pregnancy, antepartum; Advanced maternal age in multigravida; Previous cesarean delivery affecting pregnancy, antepartum; Down syndrome in child of prior pregnancy, currently pregnant; [redacted] weeks gestation of pregnancy; ASCUS with positive high risk HPV; Abdominal wall hernia; and HIV disease (HCC) on her problem list.  Patient reports occasional contractions and tooth pain since before her pregnancy.  Contractions: Irregular. Vag. Bleeding: None.  Movement: Present. Denies leaking of fluid.   The following portions of the patient's history were reviewed and updated as appropriate: allergies, current medications, past family history, past medical history, past social history, past surgical history and problem list. Problem list updated.  Objective:   Vitals:   05/28/16 1115  BP: 104/72  Pulse: 87  Weight: 195 lb 14.4 oz (88.9 kg)    Fetal Status: Fetal Heart Rate (bpm): 137 Fundal Height: 35 cm Movement: Present     General:  Alert, oriented and cooperative. Patient is in no acute distress.  Skin: Skin is warm and dry. No rash noted.   Cardiovascular: Normal heart rate noted  Respiratory: Normal respiratory effort, no problems with respiration noted  Abdomen: Soft, gravid, appropriate for gestational age. Pain/Pressure: Present     Pelvic:  Cervical exam deferred        Extremities: Normal range of motion.  Edema: None  Mental Status: Normal mood and affect. Normal behavior. Normal judgment and thought content.   Assessment and Plan:  Pregnancy: [id number] at 9122w4d  1. Supervision of high risk pregnancy, antepartum  - GC/Chlamydia probe amp ([place])not at [facility] - Culture, beta strep (group b only) - Glucose Tolerance, 1 HR (50g) -  CBC - RPR - Flu Vaccine QUAD 36+ mos IM  2. Elderly multigravida in second trimester  - GC/Chlamydia probe amp ([place]not at [facility] - Culture, beta strep (group b only) - Glucose Tolerance, 1 HR (50g) - CBC - RPR - Flu Vaccine QUAD 36+ mos IM  3. Previous cesarean delivery affecting pregnancy, antepartum  - GC/Chlamydia probe amp ([place]not at [facility] - Culture, beta strep (group b only) - Glucose Tolerance, 1 HR (50g) - CBC - RPR - Flu Vaccine QUAD 36+ mos IM  4. Tooth pain --Dental letter provided, [facility]  5. Acute nasopharyngitis --No fever or chills. Likely viral. Discussed safe OTC medications in pregnancy.     Preterm labor symptoms and general obstetric precautions including but not limited to vaginal bleeding, contractions, leaking of fluid and fetal movement were reviewed in detail with the patient. Please refer to After Visit Summary for other counseling recommendations.  Return in about 2 weeks (around 06/11/2016).   Hurshel PartyLisa A Leftwich-Kirby, CNM

## 2016-05-28 NOTE — Patient Instructions (Addendum)
Third Trimester of Pregnancy The third trimester is from week 29 through week 40 (months 7 through 9). The third trimester is a time when the unborn baby (fetus) is growing rapidly. At the end of the ninth month, the fetus is about 20 inches in length and weighs 6-10 pounds. Body changes during your third trimester Your body goes through many changes during pregnancy. The changes vary from woman to woman. During the third trimester:  Your weight will continue to increase. You can expect to gain 25-35 pounds (11-16 kg) by the end of the pregnancy.  You may begin to get stretch marks on your hips, abdomen, and breasts.  You may urinate more often because the fetus is moving lower into your pelvis and pressing on your bladder.  You may develop or continue to have heartburn. This is caused by increased hormones that slow down muscles in the digestive tract.  You may develop or continue to have constipation because increased hormones slow digestion and cause the muscles that push waste through your intestines to relax.  You may develop hemorrhoids. These are swollen veins (varicose veins) in the rectum that can itch or be painful.  You may develop swollen, bulging veins (varicose veins) in your legs.  You may have increased body aches in the pelvis, back, or thighs. This is due to weight gain and increased hormones that are relaxing your joints.  You may have changes in your hair. These can include thickening of your hair, rapid growth, and changes in texture. Some women also have hair loss during or after pregnancy, or hair that feels dry or thin. Your hair will most likely return to normal after your baby is born.  Your breasts will continue to grow and they will continue to become tender. A yellow fluid (colostrum) may leak from your breasts. This is the first milk you are producing for your baby.  Your belly button may stick out.  You may notice more swelling in your hands, face, or  ankles.  You may have increased tingling or numbness in your hands, arms, and legs. The skin on your belly may also feel numb.  You may feel short of breath because of your expanding uterus.  You may have more problems sleeping. This can be caused by the size of your belly, increased need to urinate, and an increase in your body's metabolism.  You may notice the fetus "dropping," or moving lower in your abdomen.  You may have increased vaginal discharge.  Your cervix becomes thin and soft (effaced) near your due date. What to expect at prenatal visits You will have prenatal exams every 2 weeks until week 36. Then you will have weekly prenatal exams. During a routine prenatal visit:  You will be weighed to make sure you and the fetus are growing normally.  Your blood pressure will be taken.  Your abdomen will be measured to track your baby's growth.  The fetal heartbeat will be listened to.  Any test results from the previous visit will be discussed.  You may have a cervical check near your due date to see if you have effaced. At around 36 weeks, your health care provider will check your cervix. At the same time, your health care provider will also perform a test on the secretions of the vaginal tissue. This test is to determine if a type of bacteria, Group B streptococcus, is present. Your health care provider will explain this further. Your health care provider may ask you:    What your birth plan is.  How you are feeling.  If you are feeling the baby move.  If you have had any abnormal symptoms, such as leaking fluid, bleeding, severe headaches, or abdominal cramping.  If you are using any tobacco products, including cigarettes, chewing tobacco, and electronic cigarettes.  If you have any questions. Other tests or screenings that may be performed during your third trimester include:  Blood tests that check for low iron levels (anemia).  Fetal testing to check the health,  activity level, and growth of the fetus. Testing is done if you have certain medical conditions or if there are problems during the pregnancy.  Nonstress test (NST). This test checks the health of your baby to make sure there are no signs of problems, such as the baby not getting enough oxygen. During this test, a belt is placed around your belly. The baby is made to move, and its heart rate is monitored during movement. What is false labor? False labor is a condition in which you feel small, irregular tightenings of the muscles in the womb (contractions) that eventually go away. These are called Braxton Hicks contractions. Contractions may last for hours, days, or even weeks before true labor sets in. If contractions come at regular intervals, become more frequent, increase in intensity, or become painful, you should see your health care provider. What are the signs of labor?  Abdominal cramps.  Regular contractions that start at 10 minutes apart and become stronger and more frequent with time.  Contractions that start on the top of the uterus and spread down to the lower abdomen and back.  Increased pelvic pressure and dull back pain.  A watery or bloody mucus discharge that comes from the vagina.  Leaking of amniotic fluid. This is also known as your "water breaking." It could be a slow trickle or a gush. Let your doctor know if it has a color or strange odor. If you have any of these signs, call your health care provider right away, even if it is before your due date. Follow these instructions at home: Eating and drinking  Continue to eat regular, healthy meals.  Do not eat:  Raw meat or meat spreads.  Unpasteurized milk or cheese.  Unpasteurized juice.  Store-made salad.  Refrigerated smoked seafood.  Hot dogs or deli meat, unless they are piping hot.  More than 6 ounces of albacore tuna a week.  Shark, swordfish, king mackerel, or tile fish.  Store-made salads.  Raw  sprouts, such as mung bean or alfalfa sprouts.  Take prenatal vitamins as told by your health care provider.  Take 1000 mg of calcium daily as told by your health care provider.  If you develop constipation:  Take over-the-counter or prescription medicines.  Drink enough fluid to keep your urine clear or pale yellow.  Eat foods that are high in fiber, such as fresh fruits and vegetables, whole grains, and beans.  Limit foods that are high in fat and processed sugars, such as fried and sweet foods. Activity  Exercise only as directed by your health care provider. Healthy pregnant women should aim for 2 hours and 30 minutes of moderate exercise per week. If you experience any pain or discomfort while exercising, stop.  Avoid heavy lifting.  Do not exercise in extreme heat or humidity, or at high altitudes.  Wear low-heel, comfortable shoes.  Practice good posture.  Do not travel far distances unless it is absolutely necessary and only with the approval   of your health care provider.  Wear your seat belt at all times while in a car, on a bus, or on a plane.  Take frequent breaks and rest with your legs elevated if you have leg cramps or low back pain.  Do not use hot tubs, steam rooms, or saunas.  You may continue to have sex unless your health care provider tells you otherwise. Lifestyle  Do not use any products that contain nicotine or tobacco, such as cigarettes and e-cigarettes. If you need help quitting, ask your health care provider.  Do not drink alcohol.  Do not use any medicinal herbs or unprescribed drugs. These chemicals affect the formation and growth of the baby.  If you develop varicose veins:  Wear support pantyhose or compression stockings as told by your healthcare provider.  Elevate your feet for 15 minutes, 3-4 times a day.  Wear a supportive maternity bra to help with breast tenderness. General instructions  Take over-the-counter and prescription  medicines only as told by your health care provider. There are medicines that are either safe or unsafe to take during pregnancy.  Take warm sitz baths to soothe any pain or discomfort caused by hemorrhoids. Use hemorrhoid cream or witch hazel if your health care provider approves.  Avoid cat litter boxes and soil used by cats. These carry germs that can cause birth defects in the baby. If you have a cat, ask someone to clean the litter box for you.  To prepare for the arrival of your baby:  Take prenatal classes to understand, practice, and ask questions about the labor and delivery.  Make a trial run to the hospital.  Visit the hospital and tour the maternity area.  Arrange for maternity or paternity leave through employers.  Arrange for family and friends to take care of pets while you are in the hospital.  Purchase a rear-facing car seat and make sure you know how to install it in your car.  Pack your hospital bag.  Prepare the baby's nursery. Make sure to remove all pillows and stuffed animals from the baby's crib to prevent suffocation.  Visit your dentist if you have not gone during your pregnancy. Use a soft toothbrush to brush your teeth and be gentle when you floss.  Keep all prenatal follow-up visits as told by your health care provider. This is important. Contact a health care provider if:  You are unsure if you are in labor or if your water has broken.  You become dizzy.  You have mild pelvic cramps, pelvic pressure, or nagging pain in your abdominal area.  You have lower back pain.  You have persistent nausea, vomiting, or diarrhea.  You have an unusual or bad smelling vaginal discharge.  You have pain when you urinate. Get help right away if:  You have a fever.  You are leaking fluid from your vagina.  You have spotting or bleeding from your vagina.  You have severe abdominal pain or cramping.  You have rapid weight loss or weight gain.  You have  shortness of breath with chest pain.  You notice sudden or extreme swelling of your face, hands, ankles, feet, or legs.  Your baby makes fewer than 10 movements in 2 hours.  You have severe headaches that do not go away with medicine.  You have vision changes. Summary  The third trimester is from week 29 through week 40, months 7 through 9. The third trimester is a time when the unborn baby (fetus)   is growing rapidly.  During the third trimester, your discomfort may increase as you and your baby continue to gain weight. You may have abdominal, leg, and back pain, sleeping problems, and an increased need to urinate.  During the third trimester your breasts will keep growing and they will continue to become tender. A yellow fluid (colostrum) may leak from your breasts. This is the first milk you are producing for your baby.  False labor is a condition in which you feel small, irregular tightenings of the muscles in the womb (contractions) that eventually go away. These are called Braxton Hicks contractions. Contractions may last for hours, days, or even weeks before true labor sets in.  Signs of labor can include: abdominal cramps; regular contractions that start at 10 minutes apart and become stronger and more frequent with time; watery or bloody mucus discharge that comes from the vagina; increased pelvic pressure and dull back pain; and leaking of amniotic fluid. This information is not intended to replace advice given to you by your health care provider. Make sure you discuss any questions you have with your health care provider. Document Released: 06/24/2001 Document Revised: 12/06/2015 Document Reviewed: 08/31/2012 Elsevier Interactive Patient Education  2017 [employer]  

## 2016-05-29 LAB — RPR

## 2016-05-29 LAB — GC/CHLAMYDIA PROBE AMP (~~LOC~~) NOT AT ARMC
Chlamydia: NEGATIVE
NEISSERIA GONORRHEA: NEGATIVE

## 2016-05-29 LAB — GLUCOSE TOLERANCE, 1 HOUR (50G) W/O FASTING: Glucose, 1 Hr, gestational: 123 mg/dL (ref <140)

## 2016-05-29 LAB — CULTURE, BETA STREP (GROUP B ONLY)

## 2016-06-09 ENCOUNTER — Telehealth: Payer: Self-pay | Admitting: *Deleted

## 2016-06-09 LAB — HIV-1 GENOTYPR PLUS

## 2016-06-09 NOTE — Telephone Encounter (Signed)
RN contacted the husband after noting that his wife has returned from [place]. Viral load is elevated at 74,700 and she has plans on delivering her baby soon. RN informed patient's husband that my call is just to f/u to ensure that the patient has her medications since she has a elevated viral load.  Mr. Gaylyn CheersDjibo-Adoubacar stated his wife did not have her medications but has been taking the medication each day. Mr. Gaylyn CheersDjibo-Adoubacar questioned how high his wife's viral load was. RN informed Mr. Gaylyn CheersDjibo-Adoubacar that 2 weeks ago that her viral load was over 74,700 and we need for it to be less than 20. Mr. Gaylyn CheersDjibo-Adoubacar sighed and stated that the results were really high and he will help his wife with remembering to take her medication each day. Mr. Gaylyn CheersDjibo-Adoubacar also stated he did not have our office number to get in contact with us. After ending the call RN text messaged our number/[facility] office to him for future contact.

## 2016-06-12 ENCOUNTER — Ambulatory Visit: Payer: Self-pay | Admitting: Internal Medicine

## 2016-06-17 ENCOUNTER — Ambulatory Visit (INDEPENDENT_AMBULATORY_CARE_PROVIDER_SITE_OTHER): Payer: Self-pay | Admitting: Obstetrics and Gynecology

## 2016-06-17 VITALS — BP 108/75 | HR 86 | Wt 198.4 lb

## 2016-06-17 DIAGNOSIS — O98713 Human immunodeficiency virus [HIV] disease complicating pregnancy, third trimester: Secondary | ICD-10-CM

## 2016-06-17 DIAGNOSIS — B20 Human immunodeficiency virus [HIV] disease: Secondary | ICD-10-CM

## 2016-06-17 DIAGNOSIS — O34219 Maternal care for unspecified type scar from previous cesarean delivery: Secondary | ICD-10-CM

## 2016-06-17 DIAGNOSIS — Z21 Asymptomatic human immunodeficiency virus [HIV] infection status: Secondary | ICD-10-CM

## 2016-06-17 DIAGNOSIS — O09299 Supervision of pregnancy with other poor reproductive or obstetric history, unspecified trimester: Secondary | ICD-10-CM

## 2016-06-17 DIAGNOSIS — O0990 Supervision of high risk pregnancy, unspecified, unspecified trimester: Secondary | ICD-10-CM

## 2016-06-17 DIAGNOSIS — O09293 Supervision of pregnancy with other poor reproductive or obstetric history, third trimester: Secondary | ICD-10-CM

## 2016-06-17 LAB — HEMOGLOBIN A1C
HEMOGLOBIN A1C: 5.3 % (ref <5.7)
Mean Plasma Glucose: 105 mg/dL

## 2016-06-17 MED ORDER — ZIDOVUDINE 10 MG/ML IV SOLN: 1.0000 mg/kg | INTRAVENOUS | Status: DC

## 2016-06-17 MED ORDER — ZIDOVUDINE 10 MG/ML IV SOLN: 2.0000 mg/kg | Freq: Once | INTRAVENOUS | Status: DC

## 2016-06-17 NOTE — Progress Notes (Signed)
   PRENATAL VISIT NOTE  Subjective:  Terri Padilla is a 35 y.o. [id number] at 7658w3d being seen today for ongoing prenatal care.  She is currently monitored for the following issues for this high-risk pregnancy and has Supervision of high risk pregnancy, antepartum; Advanced maternal age in multigravida; Previous cesarean delivery affecting pregnancy, antepartum; Down syndrome in child of prior pregnancy, currently pregnant; ASCUS with positive high risk HPV; Abdominal wall hernia; and HIV disease (HCC) on her problem list.  Patient reports no complaints.  Contractions: Not present. Vag. Bleeding: None.  Movement: Present. Denies leaking of fluid.   The following portions of the patient's history were reviewed and updated as appropriate: allergies, current medications, past family history, past medical history, past social history, past surgical history and problem list. Problem list updated.  Objective:   Vitals:   06/17/16 1422  BP: 108/75  Pulse: 86  Weight: 90 kg (198 lb 6.6 oz)    Fetal Status: Fetal Heart Rate (bpm): 140 Fundal Height: 38 cm Movement: Present  Presentation: Vertex  General:  Alert, oriented and cooperative. Patient is in no acute distress.  Skin: Skin is warm and dry. No rash noted.   Cardiovascular: Normal heart rate noted  Respiratory: Normal respiratory effort, no problems with respiration noted  Abdomen: Soft, gravid, appropriate for gestational age. Pain/Pressure: Present     Pelvic:  Cervical exam performed Dilation: Closed Effacement (%): Thick Station: Ballotable  Extremities: Normal range of motion.  Edema: None  Mental Status: Normal mood and affect. Normal behavior. Normal judgment and thought content.   Assessment and Plan:  Pregnancy: [id number] at 4758w3d  1. Supervision of high risk pregnancy, antepartum Patient is doing well without complaints She has [place] several [place] Patient with 3 previous  cesarean section. Will be scheduled for repeat at 39 weeks. She will be given AZT 3 hours prior to delivery Patient never returned for 3 hour glucola. HgA1c done today  2. HIV disease (HCC) Last RNA viral load on 11/9 74 000  Term labor symptoms and general obstetric precautions including but not limited to vaginal bleeding, contractions, leaking of fluid and fetal movement were reviewed in detail with the patient. Please refer to After Visit Summary for other counseling recommendations.  No Follow-up on file.   Catalina AntiguaPeggy Emrys Mckamie, MD

## 2016-06-18 NOTE — Pre-Procedure Instructions (Signed)
Interpreter number [phone]

## 2016-06-19 ENCOUNTER — Encounter (HOSPITAL_COMMUNITY): Payer: Self-pay

## 2016-06-19 NOTE — Pre-Procedure Instructions (Signed)
Interpreter number [phone] Pt indicated she had scheduling conflicts and would not be able to come by on Friday for preop.  Discussed via interpreter to arrive at 0500 for CS.  NPO after midnight. Verified with pharmacy that pt was to take all  HIV meds prior to arrival and this information was relayed to the patient via the interpreter with verbalized understanding.    Encouraged pt to come for preop if at all possible.

## 2016-06-20 ENCOUNTER — Encounter (HOSPITAL_COMMUNITY)
Admission: RE | Admit: 2016-06-20 | Discharge: 2016-06-20 | Disposition: A | Discharge status: Home / Self Care | Payer: Self-pay | Source: Ambulatory Visit

## 2016-06-21 NOTE — Anesthesia Preprocedure Evaluation (Addendum)
Anesthesia Evaluation    Reviewed: Allergy & Precautions, Patient's Chart, lab work & pertinent test results  Airway Mallampati: III  TM Distance: >3 FB Neck ROM: Full    Dental  (+) Teeth Intact, Dental Advisory Given, Missing,    Pulmonary neg pulmonary ROS,    Pulmonary exam normal breath sounds clear to auscultation       Cardiovascular Exercise Tolerance: Good negative cardio ROS Normal cardiovascular exam Rhythm:Regular Rate:Normal     Neuro/Psych negative neurological ROS     GI/Hepatic negative GI ROS, Neg liver ROS,   Endo/Other  Obesity   Renal/GU negative Renal ROS     Musculoskeletal negative musculoskeletal ROS (+)   Abdominal   Peds  Hematology  (+) Blood dyscrasia, anemia , HIV, Plt 258k   Anesthesia Other Findings Day of surgery medications reviewed with the patient.  Reproductive/Obstetrics (+) Pregnancy                            Anesthesia Physical Anesthesia Plan  ASA: III  Anesthesia Plan: Spinal   Post-op Pain Management:    Induction:   Airway Management Planned:   Additional Equipment:   Intra-op Plan:   Post-operative Plan:   Informed Consent: I have reviewed the patients History and Physical, chart, labs and discussed the procedure including the risks, benefits and alternatives for the proposed anesthesia with the patient or authorized representative who has indicated his/her understanding and acceptance.   Dental advisory given  Plan Discussed with: CRNA, Anesthesiologist and Surgeon  Anesthesia Plan Comments: (Discussed risks and benefits of and differences between spinal and general. Discussed risks of spinal including headache, backache, failure, bleeding, infection, and nerve damage. Patient consents to spinal. Questions answered. Coagulation studies and platelet count acceptable.  Patient's husband utilized as interpreter during patient  encounter.)       Anesthesia Quick Evaluation

## 2016-06-22 ENCOUNTER — Inpatient Hospital Stay (HOSPITAL_COMMUNITY): Payer: Medicaid Other | Admitting: Anesthesiology

## 2016-06-22 ENCOUNTER — Inpatient Hospital Stay (HOSPITAL_COMMUNITY): Admission: AD | Admit: 2016-06-22 | Payer: Self-pay | Source: Ambulatory Visit | Admitting: Obstetrics & Gynecology

## 2016-06-22 ENCOUNTER — Inpatient Hospital Stay (HOSPITAL_COMMUNITY)
Admission: AD | Admit: 2016-06-22 | Discharge: 2016-06-25 | DRG: 765 | Disposition: A | Discharge status: Home / Self Care | Payer: Medicaid Other | Source: Ambulatory Visit | Attending: Obstetrics & Gynecology | Admitting: Obstetrics & Gynecology

## 2016-06-22 ENCOUNTER — Encounter (HOSPITAL_COMMUNITY): Payer: Self-pay | Admitting: *Deleted

## 2016-06-22 ENCOUNTER — Encounter (HOSPITAL_COMMUNITY)
Admission: AD | Disposition: A | Discharge status: Home / Self Care | Payer: Self-pay | Source: Ambulatory Visit | Attending: Obstetrics & Gynecology

## 2016-06-22 DIAGNOSIS — E669 Obesity, unspecified: Secondary | ICD-10-CM | POA: Diagnosis present

## 2016-06-22 DIAGNOSIS — Z6832 Body mass index (BMI) 32.0-32.9, adult: Secondary | ICD-10-CM

## 2016-06-22 DIAGNOSIS — O34211 Maternal care for low transverse scar from previous cesarean delivery: Principal | ICD-10-CM | POA: Diagnosis present

## 2016-06-22 DIAGNOSIS — Z21 Asymptomatic human immunodeficiency virus [HIV] infection status: Secondary | ICD-10-CM | POA: Diagnosis present

## 2016-06-22 DIAGNOSIS — O34219 Maternal care for unspecified type scar from previous cesarean delivery: Secondary | ICD-10-CM

## 2016-06-22 DIAGNOSIS — O9872 Human immunodeficiency virus [HIV] disease complicating childbirth: Secondary | ICD-10-CM | POA: Diagnosis not present

## 2016-06-22 DIAGNOSIS — O09523 Supervision of elderly multigravida, third trimester: Secondary | ICD-10-CM

## 2016-06-22 DIAGNOSIS — O99214 Obesity complicating childbirth: Secondary | ICD-10-CM | POA: Diagnosis present

## 2016-06-22 DIAGNOSIS — Z98891 History of uterine scar from previous surgery: Secondary | ICD-10-CM

## 2016-06-22 DIAGNOSIS — Z3A39 39 weeks gestation of pregnancy: Secondary | ICD-10-CM

## 2016-06-22 LAB — CBC
HEMATOCRIT: 31.8 % — AB (ref 36.0–46.0)
HEMOGLOBIN: 11 g/dL — AB (ref 12.0–15.0)
MCH: 28.3 pg (ref 26.0–34.0)
MCHC: 34.6 g/dL (ref 30.0–36.0)
MCV: 81.7 fL (ref 78.0–100.0)
Platelets: 258 10*3/uL (ref 150–400)
RBC: 3.89 MIL/uL (ref 3.87–5.11)
RDW: 13.6 % (ref 11.5–15.5)
WBC: 8.5 10*3/uL (ref 4.0–10.5)

## 2016-06-22 LAB — RPR: RPR Ser Ql: NONREACTIVE

## 2016-06-22 LAB — TYPE AND SCREEN
ABO/RH(D): O POS
Antibody Screen: NEGATIVE

## 2016-06-22 LAB — ABO/RH: ABO/RH(D): O POS

## 2016-06-22 SURGERY — Surgical Case
Anesthesia: Spinal | Site: Abdomen | Wound class: Clean Contaminated

## 2016-06-22 MED ORDER — LACTATED RINGERS IV SOLN
INTRAVENOUS | Status: DC | PRN
  Administered 2016-06-22: 12:00:00 via INTRAVENOUS

## 2016-06-22 MED ORDER — DEXAMETHASONE SODIUM PHOSPHATE 10 MG/ML IJ SOLN
INTRAMUSCULAR | Status: DC | PRN
  Administered 2016-06-22: 10 mg via INTRAVENOUS

## 2016-06-22 MED ORDER — MENTHOL 3 MG MT LOZG
1.0000 | LOZENGE | OROMUCOSAL | Status: DC | PRN
  Filled 2016-06-22: qty 9

## 2016-06-22 MED ORDER — SIMETHICONE 80 MG PO CHEW
80.0000 mg | CHEWABLE_TABLET | ORAL | Status: DC
  Administered 2016-06-22 – 2016-06-24 (×3): 80 mg via ORAL
  Filled 2016-06-22 (×3): qty 1

## 2016-06-22 MED ORDER — OXYTOCIN 40 UNITS IN LACTATED RINGERS INFUSION - SIMPLE MED: 2.5000 [IU]/h | INTRAVENOUS | Status: AC

## 2016-06-22 MED ORDER — LACTATED RINGERS IV SOLN: INTRAVENOUS | Status: DC

## 2016-06-22 MED ORDER — DIPHENHYDRAMINE HCL 50 MG/ML IJ SOLN: 12.5000 mg | INTRAMUSCULAR | Status: DC | PRN

## 2016-06-22 MED ORDER — EPHEDRINE 5 MG/ML INJ
INTRAVENOUS | Status: AC
  Filled 2016-06-22: qty 10

## 2016-06-22 MED ORDER — BUPIVACAINE IN DEXTROSE 0.75-8.25 % IT SOLN
INTRATHECAL | Status: DC | PRN
Start: 2016-06-22 — End: 2016-06-22
  Administered 2016-06-22: 1.6 mL via INTRATHECAL

## 2016-06-22 MED ORDER — KETOROLAC TROMETHAMINE 30 MG/ML IJ SOLN: 30.0000 mg | Freq: Four times a day (QID) | INTRAMUSCULAR | Status: AC | PRN

## 2016-06-22 MED ORDER — METHYLERGONOVINE MALEATE 0.2 MG PO TABS: 0.2000 mg | ORAL_TABLET | ORAL | Status: DC | PRN

## 2016-06-22 MED ORDER — CEFAZOLIN SODIUM-DEXTROSE 2-4 GM/100ML-% IV SOLN
2.0000 g | INTRAVENOUS | Status: AC
  Administered 2016-06-22: 2 g via INTRAVENOUS
  Filled 2016-06-22: qty 100

## 2016-06-22 MED ORDER — FENTANYL CITRATE (PF) 100 MCG/2ML IJ SOLN: 25.0000 ug | INTRAMUSCULAR | Status: DC | PRN

## 2016-06-22 MED ORDER — SODIUM CHLORIDE 0.9% FLUSH: 3.0000 mL | INTRAVENOUS | Status: DC | PRN

## 2016-06-22 MED ORDER — DIPHENHYDRAMINE HCL 25 MG PO CAPS: 25.0000 mg | ORAL_CAPSULE | Freq: Four times a day (QID) | ORAL | Status: DC | PRN

## 2016-06-22 MED ORDER — ZIDOVUDINE 10 MG/ML IV SOLN
1.0000 mg/kg/h | INTRAVENOUS | Status: DC
  Administered 2016-06-22: 1 mg/kg/h via INTRAVENOUS
  Filled 2016-06-22: qty 40

## 2016-06-22 MED ORDER — TETANUS-DIPHTH-ACELL PERTUSSIS 5-2.5-18.5 LF-MCG/0.5 IM SUSP: 0.5000 mL | Freq: Once | INTRAMUSCULAR | Status: DC

## 2016-06-22 MED ORDER — SOD CITRATE-CITRIC ACID 500-334 MG/5ML PO SOLN
ORAL | Status: AC
  Administered 2016-06-22: 30 mL
  Filled 2016-06-22: qty 15

## 2016-06-22 MED ORDER — ZIDOVUDINE 10 MG/ML IV SOLN
2.0000 mg/kg | Freq: Once | INTRAVENOUS | Status: AC
  Administered 2016-06-22: 181 mg via INTRAVENOUS
  Filled 2016-06-22: qty 18.1

## 2016-06-22 MED ORDER — MORPHINE SULFATE (PF) 0.5 MG/ML IJ SOLN
INTRAMUSCULAR | Status: DC | PRN
  Administered 2016-06-22: .2 mg via INTRATHECAL

## 2016-06-22 MED ORDER — NALBUPHINE HCL 10 MG/ML IJ SOLN: 5.0000 mg | Freq: Once | INTRAMUSCULAR | Status: DC | PRN

## 2016-06-22 MED ORDER — PRENATAL MULTIVITAMIN CH
1.0000 | ORAL_TABLET | Freq: Every day | ORAL | Status: DC
  Administered 2016-06-23 – 2016-06-25 (×3): 1 via ORAL
  Filled 2016-06-22 (×3): qty 1

## 2016-06-22 MED ORDER — ONDANSETRON HCL 4 MG/2ML IJ SOLN: 4.0000 mg | Freq: Three times a day (TID) | INTRAMUSCULAR | Status: DC | PRN

## 2016-06-22 MED ORDER — NALOXONE HCL 0.4 MG/ML IJ SOLN: 0.4000 mg | INTRAMUSCULAR | Status: DC | PRN

## 2016-06-22 MED ORDER — OXYTOCIN 10 UNIT/ML IJ SOLN
INTRAVENOUS | Status: DC | PRN
  Administered 2016-06-22: 40 [IU] via INTRAVENOUS

## 2016-06-22 MED ORDER — FENTANYL CITRATE (PF) 100 MCG/2ML IJ SOLN
INTRAMUSCULAR | Status: AC
  Filled 2016-06-22: qty 2

## 2016-06-22 MED ORDER — DEXAMETHASONE SODIUM PHOSPHATE 10 MG/ML IJ SOLN
INTRAMUSCULAR | Status: AC
  Filled 2016-06-22: qty 1

## 2016-06-22 MED ORDER — ERYTHROMYCIN 5 MG/GM OP OINT
TOPICAL_OINTMENT | OPHTHALMIC | Status: AC
  Filled 2016-06-22: qty 1

## 2016-06-22 MED ORDER — MORPHINE SULFATE-NACL 0.5-0.9 MG/ML-% IV SOSY
PREFILLED_SYRINGE | INTRAVENOUS | Status: AC
  Filled 2016-06-22: qty 1

## 2016-06-22 MED ORDER — LACTATED RINGERS IV SOLN
INTRAVENOUS | Status: DC
  Administered 2016-06-22 (×2): via INTRAVENOUS

## 2016-06-22 MED ORDER — EPHEDRINE SULFATE 50 MG/ML IJ SOLN
INTRAMUSCULAR | Status: DC | PRN
  Administered 2016-06-22: 10 mg via INTRAVENOUS

## 2016-06-22 MED ORDER — SCOPOLAMINE 1 MG/3DAYS TD PT72
MEDICATED_PATCH | TRANSDERMAL | Status: AC
  Filled 2016-06-22: qty 1

## 2016-06-22 MED ORDER — OXYTOCIN 10 UNIT/ML IJ SOLN
INTRAMUSCULAR | Status: AC
  Filled 2016-06-22: qty 4

## 2016-06-22 MED ORDER — SIMETHICONE 80 MG PO CHEW
80.0000 mg | CHEWABLE_TABLET | Freq: Three times a day (TID) | ORAL | Status: DC
  Administered 2016-06-22 – 2016-06-25 (×9): 80 mg via ORAL
  Filled 2016-06-22 (×8): qty 1

## 2016-06-22 MED ORDER — ACETAMINOPHEN 325 MG PO TABS
650.0000 mg | ORAL_TABLET | ORAL | Status: DC | PRN
  Administered 2016-06-24 – 2016-06-25 (×2): 650 mg via ORAL
  Filled 2016-06-22 (×2): qty 2

## 2016-06-22 MED ORDER — ONDANSETRON HCL 4 MG/2ML IJ SOLN
INTRAMUSCULAR | Status: DC | PRN
  Administered 2016-06-22: 4 mg via INTRAVENOUS

## 2016-06-22 MED ORDER — ATAZANAVIR SULFATE 200 MG PO CAPS
400.0000 mg | ORAL_CAPSULE | Freq: Every day | ORAL | Status: DC
  Administered 2016-06-22 – 2016-06-25 (×4): 400 mg via ORAL
  Filled 2016-06-22 (×5): qty 2

## 2016-06-22 MED ORDER — ONDANSETRON HCL 4 MG/2ML IJ SOLN
INTRAMUSCULAR | Status: AC
  Filled 2016-06-22: qty 2

## 2016-06-22 MED ORDER — PRENATAL 27-0.8 MG PO TABS: 1.0000 | ORAL_TABLET | Freq: Every day | ORAL | Status: DC

## 2016-06-22 MED ORDER — KETOROLAC TROMETHAMINE 30 MG/ML IJ SOLN
30.0000 mg | Freq: Four times a day (QID) | INTRAMUSCULAR | Status: AC | PRN
  Administered 2016-06-22: 30 mg via INTRAMUSCULAR

## 2016-06-22 MED ORDER — SCOPOLAMINE 1 MG/3DAYS TD PT72
MEDICATED_PATCH | TRANSDERMAL | Status: DC | PRN
  Administered 2016-06-22: 1 via TRANSDERMAL

## 2016-06-22 MED ORDER — NALBUPHINE HCL 10 MG/ML IJ SOLN
5.0000 mg | INTRAMUSCULAR | Status: DC | PRN
  Administered 2016-06-23: 5 mg via INTRAVENOUS

## 2016-06-22 MED ORDER — RITONAVIR 100 MG PO TABS
100.0000 mg | ORAL_TABLET | Freq: Every day | ORAL | Status: DC
  Administered 2016-06-22 – 2016-06-25 (×4): 100 mg via ORAL
  Filled 2016-06-22 (×6): qty 1

## 2016-06-22 MED ORDER — EMTRICITABINE-TENOFOVIR AF 200-25 MG PO TABS
1.0000 | ORAL_TABLET | Freq: Every day | ORAL | Status: DC
  Administered 2016-06-22 – 2016-06-25 (×4): 1 via ORAL
  Filled 2016-06-22 (×5): qty 1

## 2016-06-22 MED ORDER — COCONUT OIL OIL: 1.0000 "application " | TOPICAL_OIL | Status: DC | PRN

## 2016-06-22 MED ORDER — SENNOSIDES-DOCUSATE SODIUM 8.6-50 MG PO TABS
2.0000 | ORAL_TABLET | ORAL | Status: DC
  Administered 2016-06-22 – 2016-06-24 (×3): 2 via ORAL
  Filled 2016-06-22 (×3): qty 2

## 2016-06-22 MED ORDER — SODIUM CHLORIDE 0.9 % IR SOLN
Status: DC | PRN
  Administered 2016-06-22: 1000 mL

## 2016-06-22 MED ORDER — WITCH HAZEL-GLYCERIN EX PADS: 1.0000 "application " | MEDICATED_PAD | CUTANEOUS | Status: DC | PRN

## 2016-06-22 MED ORDER — DIPHENHYDRAMINE HCL 25 MG PO CAPS: 25.0000 mg | ORAL_CAPSULE | ORAL | Status: DC | PRN

## 2016-06-22 MED ORDER — FENTANYL CITRATE (PF) 100 MCG/2ML IJ SOLN
INTRAMUSCULAR | Status: DC | PRN
  Administered 2016-06-22: 10 ug via INTRATHECAL

## 2016-06-22 MED ORDER — NALOXONE HCL 2 MG/2ML IJ SOSY
1.0000 ug/kg/h | PREFILLED_SYRINGE | INTRAVENOUS | Status: DC | PRN
  Filled 2016-06-22: qty 2

## 2016-06-22 MED ORDER — METHYLERGONOVINE MALEATE 0.2 MG/ML IJ SOLN: 0.2000 mg | INTRAMUSCULAR | Status: DC | PRN

## 2016-06-22 MED ORDER — IBUPROFEN 600 MG PO TABS
600.0000 mg | ORAL_TABLET | Freq: Four times a day (QID) | ORAL | Status: DC
  Administered 2016-06-22 – 2016-06-25 (×11): 600 mg via ORAL
  Filled 2016-06-22 (×11): qty 1

## 2016-06-22 MED ORDER — MEPERIDINE HCL 25 MG/ML IJ SOLN: 6.2500 mg | INTRAMUSCULAR | Status: DC | PRN

## 2016-06-22 MED ORDER — ACETAMINOPHEN 500 MG PO TABS
1000.0000 mg | ORAL_TABLET | Freq: Four times a day (QID) | ORAL | Status: AC
  Administered 2016-06-22 – 2016-06-23 (×3): 1000 mg via ORAL
  Filled 2016-06-22 (×4): qty 2

## 2016-06-22 MED ORDER — NALBUPHINE HCL 10 MG/ML IJ SOLN
5.0000 mg | INTRAMUSCULAR | Status: DC | PRN
  Administered 2016-06-22: 5 mg via SUBCUTANEOUS
  Filled 2016-06-22 (×2): qty 1

## 2016-06-22 MED ORDER — PROMETHAZINE HCL 25 MG/ML IJ SOLN: 6.2500 mg | INTRAMUSCULAR | Status: DC | PRN

## 2016-06-22 MED ORDER — DIBUCAINE 1 % RE OINT: 1.0000 "application " | TOPICAL_OINTMENT | RECTAL | Status: DC | PRN

## 2016-06-22 MED ORDER — PHENYLEPHRINE 8 MG IN D5W 100 ML (0.08MG/ML) PREMIX OPTIME
INJECTION | INTRAVENOUS | Status: DC | PRN
  Administered 2016-06-22: 60 ug/min via INTRAVENOUS

## 2016-06-22 MED ORDER — ZOLPIDEM TARTRATE 5 MG PO TABS: 5.0000 mg | ORAL_TABLET | Freq: Every evening | ORAL | Status: DC | PRN

## 2016-06-22 MED ORDER — SIMETHICONE 80 MG PO CHEW: 80.0000 mg | CHEWABLE_TABLET | ORAL | Status: DC | PRN

## 2016-06-22 SURGICAL SUPPLY — 40 items
CHLORAPREP W/TINT 26ML (MISCELLANEOUS) ×6 IMPLANT
CLAMP CORD UMBIL (MISCELLANEOUS) IMPLANT
CLOTH BEACON ORANGE TIMEOUT ST (SAFETY) ×3 IMPLANT
DERMABOND ADHESIVE PROPEN (GAUZE/BANDAGES/DRESSINGS) ×4
DERMABOND ADVANCED (GAUZE/BANDAGES/DRESSINGS) ×4
DERMABOND ADVANCED .7 DNX12 (GAUZE/BANDAGES/DRESSINGS) ×2 IMPLANT
DERMABOND ADVANCED .7 DNX6 (GAUZE/BANDAGES/DRESSINGS) ×2 IMPLANT
DRSG OPSITE POSTOP 4X10 (GAUZE/BANDAGES/DRESSINGS) ×3 IMPLANT
ELECT REM PT RETURN 9FT ADLT (ELECTROSURGICAL) ×3
ELECTRODE REM PT RTRN 9FT ADLT (ELECTROSURGICAL) ×1 IMPLANT
EXTRACTOR VACUUM BELL STYLE (SUCTIONS) IMPLANT
GLOVE BIOGEL PI IND STRL 7.0 (GLOVE) ×1 IMPLANT
GLOVE BIOGEL PI IND STRL 8 (GLOVE) ×1 IMPLANT
GLOVE BIOGEL PI INDICATOR 7.0 (GLOVE) ×2
GLOVE BIOGEL PI INDICATOR 8 (GLOVE) ×2
GLOVE ECLIPSE 8.0 STRL XLNG CF (GLOVE) ×3 IMPLANT
GOWN STRL REUS W/TWL LRG LVL3 (GOWN DISPOSABLE) ×6 IMPLANT
KIT ABG SYR 3ML LUER SLIP (SYRINGE) ×3 IMPLANT
NEEDLE HYPO 18GX1.5 BLUNT FILL (NEEDLE) ×3 IMPLANT
NEEDLE HYPO 22GX1.5 SAFETY (NEEDLE) ×3 IMPLANT
NEEDLE HYPO 25X5/8 SAFETYGLIDE (NEEDLE) ×3 IMPLANT
NS IRRIG 1000ML POUR BTL (IV SOLUTION) ×3 IMPLANT
PACK C SECTION WH (CUSTOM PROCEDURE TRAY) ×3 IMPLANT
PAD OB MATERNITY 4.3X12.25 (PERSONAL CARE ITEMS) ×3 IMPLANT
PENCIL SMOKE EVAC W/HOLSTER (ELECTROSURGICAL) ×3 IMPLANT
RETRACTOR WND ALEXIS 25 LRG (MISCELLANEOUS) ×1 IMPLANT
RTRCTR C-SECT PINK 25CM LRG (MISCELLANEOUS) IMPLANT
RTRCTR WOUND ALEXIS 25CM LRG (MISCELLANEOUS) ×3
SUT CHROMIC 0 CT 1 (SUTURE) ×3 IMPLANT
SUT MNCRL 0 VIOLET CTX 36 (SUTURE) ×4 IMPLANT
SUT MONOCRYL 0 CTX 36 (SUTURE) ×8
SUT PLAIN 2 0 (SUTURE)
SUT PLAIN 2 0 XLH (SUTURE) IMPLANT
SUT PLAIN ABS 2-0 CT1 27XMFL (SUTURE) IMPLANT
SUT VIC AB 0 CTX 36 (SUTURE) ×2
SUT VIC AB 0 CTX36XBRD ANBCTRL (SUTURE) ×1 IMPLANT
SUT VIC AB 4-0 KS 27 (SUTURE) ×6 IMPLANT
SYR 20CC LL (SYRINGE) ×6 IMPLANT
TOWEL OR 17X24 6PK STRL BLUE (TOWEL DISPOSABLE) ×3 IMPLANT
TRAY FOLEY CATH SILVER 14FR (SET/KITS/TRAYS/PACK) IMPLANT

## 2016-06-22 NOTE — Progress Notes (Signed)
Dr Erin Fullingharraway smith notified 1st AZT infusion complete, 3 hour mark will be 1015 and that's when case was plan to be started per H&P note. Plan to continue as previously directed. Or and Anesthesia will be updated as well as family.

## 2016-06-22 NOTE — Anesthesia Procedure Notes (Signed)
Spinal  Patient location during procedure: OR Start time: 06/22/2016 11:20 AM End time: 06/22/2016 11:22 AM Staffing Anesthesiologist: Cecile HearingURK, Keneth Borg EDWARD Performed: anesthesiologist  Preanesthetic Checklist Completed: patient identified, surgical consent, pre-op evaluation, timeout performed, IV checked, risks and benefits discussed and monitors and equipment checked Spinal Block Patient position: sitting Prep: site prepped and draped and DuraPrep Patient monitoring: continuous pulse ox and blood pressure Approach: midline Location: L3-4 Needle Needle type: Pencan  Needle gauge: 25 G Needle length: 9 cm Assessment Sensory level: T4 Additional Notes Functioning IV was confirmed and monitors were applied. Sterile prep and drape, including hand hygiene, mask and sterile gloves were used. The patient was positioned and the spine was prepped. The skin was anesthetized with lidocaine.  Free flow of clear CSF was obtained prior to injecting local anesthetic into the CSF.  The spinal needle aspirated freely following injection.  The needle was carefully withdrawn.  The patient tolerated the procedure well. Consent was obtained prior to procedure with all questions answered and concerns addressed. Risks including but not limited to bleeding, infection, nerve damage, paralysis, failed block, inadequate analgesia, allergic reaction, high spinal, itching and headache were discussed and the patient wished to proceed.   Arrie AranStephen Carnisha Feltz, MD

## 2016-06-22 NOTE — Op Note (Signed)
Preoperative diagnosis:  1.  Intrauterine pregnancy at [redacted]w[redacted]d  weeks gestation                                         2.  Previous Caesarean section x 3                                         3.  +HIV with significant viral load   Postoperative diagnosis:  Same as above plus declines tubal ligation  Procedure:  Repeat cesarean section  Surgeon:  Lazaro ArmsLuther H Eure MD  Assistant:    Anesthesia: Spinal  Findings:  .    Over a low transverse incision was delivered a viable female with Apgars of 9 and 9 weighing 6 lbs. 5 oz. Uterus, tubes and ovaries were all normal.  There were no other significant findings  Description of operation:  Patient was taken to the operating room and placed in the sitting position where she underwent a spinal anesthetic. She was then placed in the supine position with tilt to the left side. When adequate anesthetic level was obtained she was prepped and draped in usual sterile fashion and a Foley catheter was placed. A Pfannenstiel skin incision was made and carried down sharply to the rectus fascia which was scored in the midline extended laterally. The fascia was taken off the muscles both superiorly and without difficulty. The muscles were divided.  The peritoneal cavity was entered.  Bladder blade was placed, no bladder flap was created.  The bladder is adherent high on the lower uterine segment.  A low transverse hysterotomy incision was made and delivered a viable female  infant at [id number] with Apgars of 9 and 9 weighing6 lbs 5 oz.  The uterus was exteriorized. It was closed in 2 layers, the first being a running interlocking layer and the second being an imbricating layer using 0 monocryl on a CTX needle. There was good resulting hemostasis. The uterus tubes and ovaries were all normal. Peritoneal cavity was irrigated vigorously. The muscles and peritoneum were reapproximated loosely. The fascia was closed using 0 Vicryl in running fashion. Subcutaneous tissue was made  hemostatic and irrigated. The skin was closed using 4-0 Vicryl on a Keith needle in a subcuticular fashion.  Dermabond was placed for additional wound integrity and to serve as a barrier. Blood loss for the procedure was 500 cc. The patient received a gram of Ancef prophylactically. The patient was taken to the recovery room in good stable condition with all counts being correct x3.  EBL 500 cc  EURE,LUTHER H 06/22/2016 1:24 PM

## 2016-06-22 NOTE — H&P (Signed)
Preoperative History and Physical  Terri Padilla is a 35 y.o. [id number] with Patient's last menstrual period was 09/22/2015. admitted for a repeat Caesarean section, patient vehemently declines tubal ligation. Previous Caesarean section x 3  Terri Padilla is a 35 y.o. [id number] at 8664w3d being seen today for ongoing prenatal care.  She is currently monitored for the following issues for this high-risk pregnancy and has Supervision of high risk pregnancy, antepartum; Advanced maternal age in multigravida; Previous cesarean delivery affecting pregnancy, antepartum; Down syndrome in child of prior pregnancy, currently pregnant; ASCUS with positive high risk HPV; Abdominal wall hernia; and HIV disease (HCC) on her problem list.   PMH:    Past Medical History:  Diagnosis Date  . HIV (human immunodeficiency virus infection) (HCC)     PSH:     Past Surgical History:  Procedure Laterality Date  . CESAREAN SECTION      POb/GynH:      OB History    Gravida Para Term Preterm AB Living   5 3 3  0 1 3   SAB TAB Ectopic Multiple Live Births   1 0 0 0 3      SH:   Social History  Substance Use Topics  . Smoking status: Never Smoker  . Smokeless tobacco: Never Used  . Alcohol use No   Current Facility-Administered Medications for the 06/22/16 encounter [facility](Hospital Encounter)  Medication  . zidovudine (RETROVIR) 180 mg in dextrose 5 % 25 mL IVPB  . zidovudine (RETROVIR) 90 mg in dextrose 5 % 25 mL IVPB   Current Meds  Medication Sig  . acetaminophen (TYLENOL) 500 MG tablet Take 500 mg by mouth every 6 (six) hours as needed.  Marland Kitchen. atazanavir (REYATAZ) 200 MG capsule Take 2 capsules (400 mg total) by mouth daily with breakfast.  . emtricitabine-tenofovir AF (DESCOVY) 200-25 MG tablet Take 1 tablet by mouth daily.  . fexofenadine (ALLEGRA) 180 MG tablet Take 180 mg by mouth daily.  Marland Kitchen. ibuprofen (ADVIL,MOTRIN) 200 MG tablet Take 200 mg by mouth every 6 (six) hours as needed for  mild pain.  . Prenatal Vit-Fe Fumarate-FA (MULTIVITAMIN-PRENATAL) 27-0.8 MG TABS tablet Take 1 tablet by mouth daily at 12 noon.  . ritonavir (NORVIR) 100 MG TABS tablet Take 1 tablet (100 mg total) by mouth daily.    FH:    Family History  Problem Relation Age of Onset  . Arthritis Mother   . Down syndrome Daughter   . Asthma Neg Hx   . Alcohol abuse Neg Hx   . Birth defects Neg Hx   . Cancer Neg Hx   . COPD Neg Hx   . Depression Neg Hx   . Diabetes Neg Hx   . Drug abuse Neg Hx   . Early death Neg Hx   . Hearing loss Neg Hx   . Heart disease Neg Hx   . Hyperlipidemia Neg Hx   . Hypertension Neg Hx   . Kidney disease Neg Hx   . Learning disabilities Neg Hx   . Mental illness Neg Hx   . Mental retardation Neg Hx   . Miscarriages / Stillbirths Neg Hx   . Stroke Neg Hx   . Vision loss Neg Hx   . Varicose Veins Neg Hx      Allergies:  Allergies  Allergen Reactions  . Chloroquine Itching    Per Interpreter: Nivaquine (Chloroquine)    Medications:       Current Facility-Administered Medications:  .  ceFAZolin (ANCEF) IVPB 2g/100  mL premix, 2 g, Intravenous, On Call to OR, Peggy Constant, MD .  erythromycin 5 MG/GM ophthalmic ointment, , , ,  .  lactated ringers infusion, , Intravenous, Continuous, Terri ArmsLuther H Mileah Hemmer, MD, Last Rate: 125 mL/hr at 06/22/16 0630 .  sodium citrate-citric acid (ORACIT) 500-334 MG/5ML solution, , , ,  .  [COMPLETED] zidovudine (RETROVIR) 181 mg in dextrose 5 % 100 mL IVPB, 2 mg/kg, Intravenous, Once, 181 mg at 06/22/16 0716 **FOLLOWED BY** zidovudine (RETROVIR) 400 mg in dextrose 5 % 100 mL (4 mg/mL) infusion, 1 mg/kg/hr, Intravenous, Continuous, Willodean Rosenthalarolyn Harraway-Smith, MD, Last Rate: 22.6 mL/hr at 06/22/16 0823, 1 mg/kg/hr at 06/22/16 [id number]  Review of Systems:   Review of Systems  Constitutional: Negative for fever, chills, weight loss, malaise/fatigue and diaphoresis.  HENT: Negative for hearing loss, ear pain, nosebleeds, congestion, sore throat,  neck pain, tinnitus and ear discharge.   Eyes: Negative for blurred vision, double vision, photophobia, pain, discharge and redness.  Respiratory: Negative for cough, hemoptysis, sputum production, shortness of breath, wheezing and stridor.   Cardiovascular: Negative for chest pain, palpitations, orthopnea, claudication, leg swelling and PND.  Gastrointestinal: Positive for abdominal pain. Negative for heartburn, nausea, vomiting, diarrhea, constipation, blood in stool and melena.  Genitourinary: Negative for dysuria, urgency, frequency, hematuria and flank pain.  Musculoskeletal: Negative for myalgias, back pain, joint pain and falls.  Skin: Negative for itching and rash.  Neurological: Negative for dizziness, tingling, tremors, sensory change, speech change, focal weakness, seizures, loss of consciousness, weakness and headaches.  Endo/Heme/Allergies: Negative for environmental allergies and polydipsia. Does not bruise/bleed easily.  Psychiatric/Behavioral: Negative for depression, suicidal ideas, hallucinations, memory loss and substance abuse. The patient is not nervous/anxious and does not have insomnia.      PHYSICAL EXAM:  Blood pressure 121/74, pulse 75, temperature 98.3 F (36.8 C), temperature source Oral, resp. rate 18, height 5\' 6"  (1.676 m), weight 199 lb (90.3 kg), last menstrual period 09/22/2015.    Vitals reviewed. Constitutional: She is oriented to person, place, and time. She appears well-developed and well-nourished.  HENT:  Head: Normocephalic and atraumatic.  Right Ear: External ear normal.  Left Ear: External ear normal.  Nose: Nose normal.  Mouth/Throat: Oropharynx is clear and moist.  Eyes: Conjunctivae and EOM are normal. Pupils are equal, round, and reactive to light. Right eye exhibits no discharge. Left eye exhibits no discharge. No scleral icterus.  Neck: Normal range of motion. Neck supple. No tracheal deviation present. No thyromegaly present.   Cardiovascular: Normal rate, regular rhythm, normal heart sounds and intact distal pulses.  Exam reveals no gallop and no friction rub.   No murmur heard. Respiratory: Effort normal and breath sounds normal. No respiratory distress. She has no wheezes. She has no rales. She exhibits no tenderness.  GI: Soft. Bowel sounds are normal. She exhibits no distension and no mass. There is tenderness. There is no rebound and no guarding.  Genitourinary:       Vulva is normal without lesions Vagina is pink moist without discharge Cervix normal in appearance and pap is normal Uterus is gravid Adnexa is negative with normal sized ovaries by sonogram  Musculoskeletal: Normal range of motion. She exhibits no edema and no tenderness.  Neurological: She is alert and oriented to person, place, and time. She has normal reflexes. She displays normal reflexes. No cranial nerve deficit. She exhibits normal muscle tone. Coordination normal.  Skin: Skin is warm and dry. No rash noted. No erythema. No pallor.  Psychiatric: She has  a normal mood and affect. Her behavior is normal. Judgment and thought content normal.    Labs: Results for orders placed or performed during the hospital encounter of 06/22/16 (from the past 336 hour(s))  CBC   Collection Time: 06/22/16  6:30 AM  Result Value Ref Range   WBC 8.5 4.0 - 10.5 K/uL   RBC 3.89 3.87 - 5.11 MIL/uL   Hemoglobin 11.0 (L) 12.0 - 15.0 g/dL   HCT 16.1 (L) 09.6 - 04.5 %   MCV 81.7 78.0 - 100.0 fL   MCH 28.3 26.0 - 34.0 pg   MCHC 34.6 30.0 - 36.0 g/dL   RDW 40.9 81.1 - 91.4 %   Platelets 258 150 - 400 K/uL  Type and screen   Collection Time: 06/22/16  6:30 AM  Result Value Ref Range   ABO/RH(D) O POS    Antibody Screen NEG    Sample Expiration 06/25/2016   Results for orders placed or performed in visit on 06/17/16 (from the past 336 hour(s))  Hemoglobin A1c   Collection Time: 06/17/16  3:06 PM  Result Value Ref Range   Hgb A1c MFr Bld 5.3 <5.7 %    Mean Plasma Glucose 105 mg/dL    EKG: No orders found for this or any previous visit.  Imaging Studies: No results found.    Assessment: [redacted]w[redacted]d Estimated Date of Delivery: 06/28/16  Previous Casesarean section x 3 +HIV on meds with significant viral load  Patient Active Problem List   Diagnosis Date Noted  . ASCUS with positive high risk HPV 03/12/2016  . Abdominal wall hernia 03/12/2016  . HIV disease (HCC) 03/12/2016  . Supervision of high risk pregnancy, antepartum 02/21/2016  . Advanced maternal age in multigravida 02/21/2016  . Previous cesarean delivery affecting pregnancy, antepartum 02/21/2016  . Down syndrome in child of prior pregnancy, currently pregnant 02/21/2016    Plan: Repeat Caesarean section Pt has received her preload dose of AZT and is now on infuion Dr Eric Form, is aware of the patient's status  Michal Strzelecki H 06/22/2016 10:54 AM

## 2016-06-22 NOTE — Progress Notes (Addendum)
[facility], Laurelvillenka, [phone]. In [place]. Pt agrees for FOB to asisst during stay with interpreting.

## 2016-06-22 NOTE — Progress Notes (Signed)
0600 pt arrived to room 172, pt given gown to change. Pt stopped to pray before changing. Also, speaks [place] only, FOB has 3 children to watch. Going to another unit to find working language IPAD,.

## 2016-06-22 NOTE — Anesthesia Postprocedure Evaluation (Signed)
Anesthesia Post Note  Patient: Tarina Djibo-Aboubacar  Procedure(s) Performed: Procedure(s) (LRB): CESAREAN SECTION (N/A)  Patient location during evaluation: PACU Anesthesia Type: Spinal Level of consciousness: oriented and awake and alert Pain management: pain level controlled Vital Signs Assessment: post-procedure vital signs reviewed and stable Respiratory status: spontaneous breathing, respiratory function stable and patient connected to nasal cannula oxygen Cardiovascular status: blood pressure returned to baseline and stable Postop Assessment: no headache, no backache, spinal receding, patient able to bend at knees and no signs of nausea or vomiting Anesthetic complications: no     Last Vitals:  Vitals:   06/22/16 1524 06/22/16 1643  BP: 120/64 124/79  Pulse: 64 81  Resp: 20   Temp: 36.5 C 36.7 C    Last Pain:  Vitals:   06/22/16 1643  TempSrc: Oral  PainSc: 0-No pain   Pain Goal:                 Cecile HearingStephen Edward Shakesha Soltau

## 2016-06-22 NOTE — Transfer of Care (Signed)
Immediate Anesthesia Transfer of Care Note  Patient: Elfrida Djibo-Aboubacar  Procedure(s) Performed: Procedure(s): CESAREAN SECTION (N/A)  Patient Location: PACU  Anesthesia Type:Spinal  Level of Consciousness: awake, alert  and oriented  Airway & Oxygen Therapy: Patient Spontanous Breathing  Post-op Assessment: Report given to RN and Post -op Vital signs reviewed and stable  Post vital signs: Reviewed and stable  Last Vitals:  Vitals:   06/22/16 0757 06/22/16 1053  BP: 121/74 (!) 105/56  Pulse: 75 79  Resp: 18 18  Temp:  36.8 C    Last Pain:  Vitals:   06/22/16 1053  TempSrc: Oral  PainSc:          Complications: No apparent anesthesia complications

## 2016-06-23 DIAGNOSIS — Z3A39 39 weeks gestation of pregnancy: Secondary | ICD-10-CM

## 2016-06-23 DIAGNOSIS — O9872 Human immunodeficiency virus [HIV] disease complicating childbirth: Secondary | ICD-10-CM

## 2016-06-23 DIAGNOSIS — O34219 Maternal care for unspecified type scar from previous cesarean delivery: Secondary | ICD-10-CM

## 2016-06-23 LAB — CBC
HEMATOCRIT: 30.8 % — AB (ref 36.0–46.0)
HEMOGLOBIN: 10.7 g/dL — AB (ref 12.0–15.0)
MCH: 28.2 pg (ref 26.0–34.0)
MCHC: 34.7 g/dL (ref 30.0–36.0)
MCV: 81.3 fL (ref 78.0–100.0)
Platelets: 269 10*3/uL (ref 150–400)
RBC: 3.79 MIL/uL — ABNORMAL LOW (ref 3.87–5.11)
RDW: 13.6 % (ref 11.5–15.5)
WBC: 16.4 10*3/uL — ABNORMAL HIGH (ref 4.0–10.5)

## 2016-06-23 NOTE — Addendum Note (Signed)
Addendum  created 06/23/16 0751 by Junious SilkMelinda Emelia Sandoval, CRNA   Sign clinical note

## 2016-06-23 NOTE — Progress Notes (Signed)
UR chart review completed.  

## 2016-06-23 NOTE — Progress Notes (Signed)
CSW met with MOB and FOB in MOB's first floor room to discuss Pediatric Infectious Disease follow up.  CSW notes that FOB has signed waiver to decline interpreting services and interpreted for CSW.   Parents were pleasant and welcoming of CSW's visit.  CSW provided choice in where baby's ID Clinic follow up appointment is scheduled.  Although FOB states that "[place]" they understand that pediatric follow up is not available with [facility] and agree to take baby to [place] provided them with information about [facility] and informed them that their first appointment will be scheduled and documented on their discharge paperwork.   CSW provided education regarding perinatal mood disorders and asked that MOB report any mental health concerns to her doctor.  MOB denies any hx of PMADs after births of other children.   CSW will complete referral and submit to I. Norrell/ID Clinic Education officer, museum.  CSW spoke with Ms. Norrell who made first appointment for 07/02/16 at 11:30am.  CSW notified CN staff and asked that this appointment be documented on discharge summary.

## 2016-06-23 NOTE — Progress Notes (Signed)
CSW has attempted to meet with MOB twice to assist with arranging baby's Pediatric Infectious Disease follow up appointment, but husband was not available either time.  CSW reads in MOB's chart that she prefers for her husband to interpret for her.  CSW will follow up later today.

## 2016-06-23 NOTE — Progress Notes (Signed)
Subjective: Postpartum Day 1: Cesarean Delivery Patient reports incisional pain and tolerating PO.    Objective: Vital signs in last 24 hours: Temp:  [97 F (36.1 C)-98.8 F (37.1 C)] 98.8 F (37.1 C) (12/11 0300) Pulse Rate:  [59-87] 70 (12/11 0300) Resp:  [12-40] 16 (12/11 0300) BP: (89-126)/(55-88) 111/66 (12/11 0300) SpO2:  [93 %-100 %] 97 % (12/11 0300) Weight:  [199 lb (90.3 kg)] 199 lb (90.3 kg) (12/10 0645)  Physical Exam:  General: alert, cooperative, appears stated age and no distress Lochia: appropriate Uterine Fundus: firm Incision: healing well, no significant drainage, no dehiscence, no significant erythema DVT Evaluation: No evidence of DVT seen on physical exam.   Recent Labs  06/22/16 0630 06/23/16 0509  HGB 11.0* 10.7*  HCT 31.8* 30.8*    Assessment/Plan: Status post Cesarean section. Doing well postoperatively.  Continue current care.  Terri Padilla 06/23/2016, 5:54 AM

## 2016-06-23 NOTE — Anesthesia Postprocedure Evaluation (Signed)
Anesthesia Post Note  Patient: Terri Padilla  Procedure(s) Performed: Procedure(s) (LRB): CESAREAN SECTION (N/A)  Patient location during evaluation: Mother Baby Anesthesia Type: Spinal Level of consciousness: oriented and awake and alert Pain management: pain level controlled Vital Signs Assessment: post-procedure vital signs reviewed and stable Respiratory status: spontaneous breathing and respiratory function stable Cardiovascular status: blood pressure returned to baseline and stable Postop Assessment: no headache and no backache Anesthetic complications: no     Last Vitals:  Vitals:   06/22/16 2300 06/23/16 0300  BP: 111/62 111/66  Pulse: 81 70  Resp: 16 16  Temp: 37.1 C 37.1 C    Last Pain:  Vitals:   06/23/16 0639  TempSrc:   PainSc: 0-No pain   Pain Goal:                 Junious SilkGILBERT,Wes Lezotte

## 2016-06-24 NOTE — Progress Notes (Signed)
POSTPARTUM PROGRESS NOTE  Post Partum Day 1:Cesearean Delivery Subjective:  Terri Padilla is a 35 y.o. [id number] 4281w1d s/p RLTCS.  No acute events overnight.  Pt denies problems with ambulating, voiding or po intake.  She denies nausea or vomiting.  Pain is well controlled.  She has not had flatus. She has not had bowel movement.  Lochia Minimal.   Objective: Blood pressure 109/66, pulse 83, temperature 98.1 F (36.7 C), temperature source Oral, resp. rate 20, height 5\' 6"  (1.676 m), weight 199 lb (90.3 kg), last menstrual period 09/22/2015, SpO2 97 %, unknown if currently breastfeeding.  Physical Exam:  General: alert, cooperative and no distress Lochia:normal flow Chest: CTAB Heart: RRR no m/r/g Abdomen: +BS, soft, nontender,  Uterine Fundus: firm, intact DVT Evaluation: No calf swelling or tenderness Extremities: no edema Incision: healing well, no dehiscence, no significant erythema, no significant drainage   Recent Labs  06/22/16 0630 06/23/16 0509  HGB 11.0* 10.7*  HCT 31.8* 30.8*    Assessment/Plan:  ASSESSMENT: Terri Padilla is a 35 y.o. [id number] 8381w1d s/p RLCTS  Plan for discharge tomorrow and Contraception abstinence   LOS: 2 days   Berniece SalinesWALLACE, NOAH I, DO PGY-3 [facility]  06/24/2016, 7:31 AM   CNM attestation Post Partum Day #1 I have seen and examined this patient and agree with above documentation in the resident's note.   Terri Padilla is a 35 y.o. [id number] s/p rLTCS.  Pt denies problems with ambulating, voiding or po intake. Pain is well controlled.  Plan for birth control is abstinence.  Method of Feeding: bottle  PE:  BP 109/66 (BP Location: Right Arm)   Pulse 83   Temp 98.1 F (36.7 C) (Oral)   Resp 20   Ht 5\' 6"  (1.676 m)   Wt 90.3 kg (199 lb)   LMP 09/22/2015   SpO2 97%   Breastfeeding? Unknown   BMI 32.12 kg/m  Fundus firm  Plan for discharge: 06/25/16  Cam HaiSHAW,  Mehul Rudin, CNM 9:03 AM  06/24/2016

## 2016-06-25 MED ORDER — OXYCODONE-ACETAMINOPHEN 5-325 MG PO TABS: 1.0000 | ORAL_TABLET | Freq: Four times a day (QID) | ORAL | 0 refills | Status: DC | PRN

## 2016-06-25 MED ORDER — ACETAMINOPHEN 325 MG PO TABS: 650.0000 mg | ORAL_TABLET | ORAL | 0 refills | Status: DC | PRN

## 2016-06-25 NOTE — Progress Notes (Signed)
CSW made referral for CC4C.

## 2016-06-25 NOTE — Discharge Summary (Signed)
OB Discharge Summary     Patient Name: Terri Padilla DOB: 08/19/1980 MRN: [id number]  Date of admission: 06/22/2016 Delivering MD: Duane LopeEURE, LUTHER H   Date of discharge: 06/25/2016  Admitting diagnosis: 39 weeks c-section REPEAT c-section - HIV positive Intrauterine pregnancy: 5140w1d     Secondary diagnosis:  Active Problems:   S/P cesarean section  Additional problems: none     Discharge diagnosis: Term Pregnancy Delivered                                                                                                Post partum procedures:none  Augmentation: none  Complications: None  Hospital course:  Sceduled C/S   35 y.o. yo [id number] at 7440w1d was admitted to the hospital 06/22/2016 for scheduled cesarean section with the following indication:Elective Repeat.  Membrane Rupture Time/Date: 11:47 AM ,06/22/2016   Patient delivered a Viable infant.06/22/2016  Details of operation can be found in separate operative note.  Pateint had an uncomplicated postpartum course.  She is ambulating, tolerating a regular diet, passing flatus, and urinating well. Patient is discharged home in stable condition on  06/25/16          Physical exam  Vitals:   06/23/16 0300 06/23/16 1908 06/24/16 1901 06/25/16 0622  BP: 111/66 109/66 123/67 134/81  Pulse: 70 83 66 61  Resp: 16 20 19 18   Temp: 98.8 F (37.1 C) 98.1 F (36.7 C) 98.5 F (36.9 C) 97.7 F (36.5 C)  TempSrc: Oral Oral Oral Oral  SpO2: 97%     Weight:      Height:       General: alert Lochia: appropriate Uterine Fundus: firm Incision: Healing well with no significant drainage, No significant erythema, Dressing is clean, dry, and intact DVT Evaluation: No evidence of DVT seen on physical exam. Negative Homan's sign. No cords or calf tenderness. No significant calf/ankle edema. Labs: Lab Results  Component Value Date   WBC 16.4 (H) 06/23/2016   HGB 10.7 (L) 06/23/2016   HCT 30.8 (L) 06/23/2016   MCV 81.3  06/23/2016   PLT 269 06/23/2016   CMP Latest Ref Rng & Units 03/04/2016  Glucose 65 - 99 mg/dL 78  BUN 7 - 25 mg/dL 5(L)  Creatinine 1.300.50 - 1.10 mg/dL 8.65(H0.46(L)  Sodium 846135 - 962146 mmol/L 134(L)  Potassium 3.5 - 5.3 mmol/L 4.1  Chloride 98 - 110 mmol/L 106  CO2 20 - 31 mmol/L 21  Calcium 8.6 - 10.2 mg/dL 7.9(L)  Total Protein 6.1 - 8.1 g/dL 7.4  Total Bilirubin 0.2 - 1.2 mg/dL 0.3  Alkaline Phos 33 - 115 U/L 35  AST 10 - 30 U/L 11  ALT 6 - 29 U/L 10    Discharge instruction: per After Visit Summary and "Baby and Me Booklet".  After visit meds:    Medication List    STOP taking these medications   multivitamin-prenatal 27-0.8 MG Tabs tablet   ondansetron 8 MG disintegrating tablet Commonly known as:  ZOFRAN ODT     TAKE these medications   acetaminophen 325 MG tablet Commonly known as:  TYLENOL Take  2 tablets (650 mg total) by mouth every 4 (four) hours as needed (for pain scale < 4). What changed:  medication strength  how much to take  when to take this  reasons to take this   atazanavir 200 MG capsule Commonly known as:  REYATAZ Take 2 capsules (400 mg total) by mouth daily with breakfast.   emtricitabine-tenofovir AF 200-25 MG tablet Commonly known as:  DESCOVY Take 1 tablet by mouth daily.   fexofenadine 180 MG tablet Commonly known as:  ALLEGRA Take 180 mg by mouth daily.   ibuprofen 200 MG tablet Commonly known as:  ADVIL,MOTRIN Take 200 mg by mouth every 6 (six) hours as needed for mild pain.   ritonavir 100 MG Tabs tablet Commonly known as:  NORVIR Take 1 tablet (100 mg total) by mouth daily.       Diet: routine diet  Activity: Advance as tolerated. Pelvic rest for 6 weeks.   Outpatient follow up:6 weeks Follow up Appt:No future appointments. Follow up Visit:No Follow-up on file.  Postpartum contraception: Abstinence  Newborn Data: Live born female  Birth Weight: 6 lb 4.5 oz (2850 g) APGAR: 8, 9  Baby Feeding:  Bottle Disposition:home with mother   06/25/2016 Deforest HoylesWALLACE, NOAH I, DO PGY-3  Patient was seen and examined by me also Agree with note Vitals stable Labs stable Fundus firm, lochia within normal limits Incision with dressing intact Ext WNL  Ready for discharge Continue HIV meds  Aviva SignsMarie L Keyon Liller, CNM

## 2016-06-25 NOTE — Discharge Instructions (Signed)
Pregnancy and HIV HIV (human immunodeficiency virus) is a permanent (chronic) viral infection. HIV kills white blood cells that are called CD4 cells. These cells help to control your body's defense system (immune system) and fight off infection. If you do not have enough CD4 cells, you can develop infections, cancers, and other health problems. With treatment, HIV may not progress as quickly. If it is not treated, HIV progresses from early infection to a life-threatening condition that is called AIDS (acquired immunodeficiency syndrome). It is possible for people with HIV to live for many years in good health and to have families. Is it safe for me to have HIV and become pregnant? Now it is safer than ever for women with HIV to have children. Make sure to involve your HIV providers, case managers, social workers, and other HIV support systems when you are planning to get pregnant. Talk with your health care provider before you get pregnant. If you are already pregnant, talk with your health care provider as soon as possible. Discuss ways to make pregnancy safer for you and your baby. Your health care provider will recommend that you:  Have a complete review of your physical health and medical history, including labs, antiretroviral therapy (ART), and other medicines that you are taking.  Decrease the amount of HIV in your body (your viral load) to be as low as possible (undetectable). You can do this by taking your ART and visiting your health care provider regularly.  Use birth control (contraception) until you are ready to get pregnant.  Stop using alcohol, tobacco, and recreational drugs before you get pregnant, if you are currently taking any of those. You will also need to:  Find a pregnancy provider, such as a nurse-midwife or obstetrician. You need to:  Tell your pregnancy provider that you have HIV.  Take prenatal vitamins.  Go to regularly scheduled prenatal appointments.  Be sure  that your HIV providers and pregnancy providers work together to manage your care. What do I need to consider when trying to get pregnant? Talk to your health care providers to learn about your options and to get the help that you need. If you and your partner both have HIV:  Both of you should be taking ART and should have undetectable viral loads before you try to get pregnant.  Both of you should be tested and treated for any infections of the sex organs before you try to get pregnant.  Only have unprotected sex when you know that you can get pregnant (when you are fertile). Otherwise, use condoms. Ask your health care providers how to figure out when you are fertile during the month. If you have HIV but your female partner does not:  Protect your partner from getting HIV when you are trying to get pregnant.  Use artificial insemination with your partners semen or a donors semen. This is the safest way to get pregnant. Self-insemination is possible. Your health care provider can tell you how to do that.  Talk with your health care provider about getting HIV medicines for your partner to decrease his risk of getting HIV. These medicines are called pre-exposure prophylaxis (PrEP). Your partner should begin PrEP a month before you start trying to get pregnant, and he should continue it for a month after you become pregnant.  Only have unprotected sex when you are fertile. Otherwise, use condoms.  Your partner should get tested for HIV on a regular basis.  You should be taking ART and have  an undetectable viral load before you try to get pregnant.  Both of you should be tested and treated for any infections of the sex organs before you try to get pregnant. How will my baby be affected by HIV? There is no way to guarantee that your baby will be born without HIV. However, women who take ART and have an undetectable viral load before and during pregnancy rarely have babies with HIV. Some babies  have side effects from ART after they are born. These problems are minor, and they get better after birth. Talk with your health care provider about your concerns. Do I need to take ART medicine during pregnancy? Yes. The most important thing for you and your baby is for you to take ART and to keep an undetectable viral load. If you are not already on ART, your health care provider will recommend that you start ART as soon as possible. You need to reach an undetectable viral load for your own health and to prevent HIV in your baby. If you are already on ART, you will probably be able to stay on your current ART. However, your health care provider may make some changes to be sure you are getting the best possible treatment. Will HIV affect my labor and delivery? If you have a low viral load:  You will have the same type of labor and delivery as an uninfected woman. This means that you will have a vaginal delivery, or you may have a cesarean delivery if needed. Continue taking your ART on schedule as much as possible. If you have a higher viral load:  You will receive extra care during labor and delivery. Your health care provider will recommend that you have a cesarean delivery at 38 weeks of pregnancy. Starting a few hours before the cesarean delivery, you will receive an HIV medicine called ZDV through an IV tube. Continue taking your ART on schedule as much as possible. What do I need to do after my baby is born?  Keep taking your ART.  See your HIV providers regularly.  Get routine follow-up care from your pregnancy providers. Talk with them about your birth control options. Can I breastfeed my baby? Breastfeeding is not recommended. What do I need to do for my baby?  Start medical care for your baby. Your babys health care provider (pediatrician) should see your baby shortly after birth and regularly during the first year.  Give your baby all of his or her medicines as directed by the  health care provider. Your baby will be prescribed the medicine ZDV for 4-6 weeks after birth to decrease the risk of HIV. Other drugs may also be prescribed. It is important to start these medicines for your baby as soon after birth as possible. You need to give your baby these medicines every day as directed by the health care provider.  Bottle-feed your baby. Do not give your baby any food that you have chewed, because that could put your baby at increased risk for HIV.  Make sure that your baby gets tested for HIV at all of these ages:  10-7 weeks old.  1-2 months old.  4-6 months old. This information is not intended to replace advice given to you by your health care provider. Make sure you discuss any questions you have with your health care provider. Document Released: 10/06/2000 Document Revised: 12/03/2015 Document Reviewed: 03/14/2014 Elsevier Interactive Patient Education  2017 Elsevier Inc[employer] Cesarean Delivery, Care After Refer to this sheet  in the next few weeks. These instructions provide you with information about caring for yourself after your procedure. Your health care provider may also give you more specific instructions. Your treatment has been planned according to current medical practices, but problems sometimes occur. Call your health care provider if you have any problems or questions after your procedure. What can I expect after the procedure? After the procedure, it is common to have:  A small amount of blood or clear fluid coming from the incision.  Some redness, swelling, and pain in your incision area.  Some abdominal pain and soreness.  Vaginal bleeding (lochia).  Pelvic cramps.  Fatigue. Follow these instructions at home: Incision care  Follow instructions from your health care provider about how to take care of your incision. Make sure you:  Wash your hands with soap and water before you change your bandage (dressing). If soap and water are not  available, use hand sanitizer.  Change your dressing as told by your health care provider.  Leave stitches (sutures), skin staples, skin glue, or adhesive strips in place. These skin closures may need to stay in place for 2 weeks or longer. If adhesive strip edges start to loosen and curl up, you may trim the loose edges. Do not remove adhesive strips completely unless your health care provider tells you to do that.  Check your incision area every day for signs of infection. Check for:  More redness, swelling, or pain.  More fluid or blood.  Warmth.  Pus or a bad smell.  When you cough or sneeze, hug a pillow. This helps with pain and decreases the chance of your incision opening up (dehiscing). Do this until your incision heals. Medicines  Take over-the-counter and prescription medicines only as told by your health care provider.  If you were prescribed an antibiotic medicine, take it as told by your health care provider. Do not stop taking the antibiotic until it is finished. Driving  Do not drive or operate heavy machinery while taking prescription pain medicine.  Do not drive for 24 hours if you received a sedative. Lifestyle  Do not drink alcohol. This is especially important if you are breastfeeding or taking pain medicine.  Do not use tobacco products, including cigarettes, chewing tobacco, or e-cigarettes. If you need help quitting, ask your health care provider. Tobacco can delay wound healing. Eating and drinking  Drink at least 8 eight-ounce glasses of water every day unless told not to by your health care provider. If you breastfeed, you may need to drink more water than this.  Eat high-fiber foods every day. These foods may help prevent or relieve constipation. High-fiber foods include:  Whole grain cereals and breads.  Brown rice.  Beans.  Fresh fruits and vegetables. Activity  Return to your normal activities as told by your health care provider. Ask your  health care provider what activities are safe for you.  Rest as much as possible. Try to rest or take a nap while your baby is sleeping.  Do not lift anything that is heavier than your baby or 10 lb (4.5 kg) as told by your health care provider.  Ask your health care provider when you can engage in sexual activity. This may depend on your:  Risk of infection.  Healing rate.  Comfort and desire to engage in sexual activity. Bathing  Do not take baths, swim, or use a hot tub until your health care provider approves. Ask your health care provider if you  can take showers. You may only be allowed to take sponge baths until your incision heals.  Keep your dressing dry as told by your health care provider. General instructions  Do not use tampons or douches until your health care provider approves.  Wear:  Loose, comfortable clothing.  A supportive and well-fitting bra.  Watch for any blood clots that may pass from your vagina. These may look like clumps of dark red, brown, or black discharge.  Keep your perineum clean and dry as told by your health care provider.  Wipe from front to back when you use the toilet.  If possible, have someone help you care for your baby and help with household activities for a few days after you leave the hospital.  Keep all follow-up visits for you and your baby as told by your health care provider. This is important. Contact a health care provider if:  You have:  Bad-smelling vaginal discharge.  Difficulty urinating.  Pain when urinating.  A sudden increase or decrease in the frequency of your bowel movements.  More redness, swelling, or pain around your incision.  More fluid or blood coming from your incision.  Pus or a bad smell coming from your incision.  A fever.  A rash.  Little or no interest in activities you used to enjoy.  Questions about caring for yourself or your baby.  Nausea.  Your incision feels warm to the  touch.  Your breasts turn red or become painful or hard.  You feel unusually sad or worried.  You vomit.  You pass large blood clots from your vagina. If you pass a blood clot, save it to show to your health care provider. Do not flush blood clots down the toilet without showing your health care provider.  You urinate more than usual.  You are dizzy or light-headed.  You have not breastfed and have not had a menstrual period for 12 weeks after delivery.  You stopped breastfeeding and have not had a menstrual period for 12 weeks after stopping breastfeeding. Get help right away if:  You have:  Pain that does not go away or get better with medicine.  Chest pain.  Difficulty breathing.  Blurred vision or spots in your vision.  Thoughts about hurting yourself or your baby.  New pain in your abdomen or in one of your legs.  A severe headache.  You faint.  You bleed from your vagina so much that you fill two sanitary pads in one hour. This information is not intended to replace advice given to you by your health care provider. Make sure you discuss any questions you have with your health care provider. Document Released: 03/22/2002 Document Revised: 11/08/2015 Document Reviewed: 06/04/2015 Elsevier Interactive Patient Education  2017 [employer]

## 2016-07-09 ENCOUNTER — Encounter: Payer: Self-pay | Admitting: Advanced Practice Midwife

## 2016-07-10 ENCOUNTER — Other Ambulatory Visit: Payer: Self-pay | Admitting: Pharmacist Clinician (PhC)/ Clinical Pharmacy Specialist

## 2016-07-10 ENCOUNTER — Ambulatory Visit (INDEPENDENT_AMBULATORY_CARE_PROVIDER_SITE_OTHER): Payer: Self-pay

## 2016-07-10 DIAGNOSIS — B20 Human immunodeficiency virus [HIV] disease: Secondary | ICD-10-CM

## 2016-07-10 MED ORDER — DARUNAVIR-COBICISTAT 800-150 MG PO TABS: 1.0000 | ORAL_TABLET | Freq: Every day | ORAL | 6 refills | Status: DC

## 2016-07-10 NOTE — Progress Notes (Signed)
HPI: Trinda PascalJemillah Djibo-Aboubacar is a 35 y.o. female who is s/p c-section of her baby is here to re-establish care.   Allergies: Allergies  Allergen Reactions  . Chloroquine Itching    Per Interpreter: Nivaquine (Chloroquine)    Vitals:    Past Medical History: Past Medical History:  Diagnosis Date  . HIV (human immunodeficiency virus infection) (HCC)     Social History: Social History   Social History  . Marital status: Married    Spouse name: N/A  . Number of children: N/A  . Years of education: N/A   Social History Main Topics  . Smoking status: Never Smoker  . Smokeless tobacco: Never Used  . Alcohol use No  . Drug use: No  . Sexual activity: Yes   Other Topics Concern  . Not on file   Social History Narrative  . No narrative on file    Previous Regimen:   Current Regimen: ATV/r + Descovy  Labs: HIV 1 RNA Quant (copies/mL)  Date Value  05/22/2016 74,756 (H)  03/04/2016 1,388 (H)   CD4 T Cell Abs (/uL)  Date Value  05/22/2016 620  03/04/2016 910   Hep B S Ab (no units)  Date Value  03/04/2016 NEG   Hepatitis B Surface Ag (no units)  Date Value  03/04/2016 NEGATIVE   HCV Ab (no units)  Date Value  03/04/2016 NEGATIVE    CrCl: CrCl cannot be calculated (Patient's most recent lab result is older than the maximum 21 days allowed.).  Lipids:    Component Value Date/Time   CHOL 120 (L) 03/04/2016 1450   TRIG 197 (H) 03/04/2016 1450   HDL 57 03/04/2016 1450   CHOLHDL 2.1 03/04/2016 1450   VLDL 39 (H) 03/04/2016 1450   LDLCALC 24 03/04/2016 1450    Assessment: Jemilla had a C-section on 12/10 for her 4th child and she walked in today. Her regimen was previous changed to ATV/r/Descovy due to pregnancy. She missed some days while in [place] but stated through her husband that she has been taking consistently since then. She is over a month out from that visit now, I'll get an HIV VL on her today. Her husband said that the nausea issue is gone  now. I'm going simplify her regimen to Prezcobix and Descovy today. Made a calendar for her and spoke to her husband at the same time. After labs, she is going to [facility] to pick up the prezcobix. Stress to her again about now breast feeding the baby and she is not currently doing it.   Recommendations:  Stop ATV/r Start Prezcobix 1 PO qday with food Cont Descovy 1 PO qday HIV VL today  Ulyses SouthwardMinh Pham, PharmD, BCPS, AAHIVP, CPP Clinical Infectious Disease Pharmacist [facility] 07/10/2016, 4:22 PM

## 2016-07-14 LAB — HIV-1 RNA ULTRAQUANT REFLEX TO GENTYP+
HIV 1 RNA Quant: 104 copies/mL — ABNORMAL HIGH (ref <20)
HIV-1 RNA Quant, Log: 2.02 Log copies/mL — ABNORMAL HIGH (ref <1.30)

## 2016-07-29 ENCOUNTER — Ambulatory Visit (INDEPENDENT_AMBULATORY_CARE_PROVIDER_SITE_OTHER): Payer: Self-pay | Admitting: Internal Medicine

## 2016-07-29 ENCOUNTER — Encounter: Payer: Self-pay | Admitting: Internal Medicine

## 2016-07-29 DIAGNOSIS — B20 Human immunodeficiency virus [HIV] disease: Secondary | ICD-10-CM

## 2016-07-29 NOTE — Progress Notes (Signed)
`     RFV: hiv disease  Patient ID: Terri Padilla, female   DOB: Aug 11, 1980, 36 y.o.   MRN: [id number]  HPI 35yo F, french speaking [place] female with HIV disease, CD 4 count of 620/VL 102. [id number]. At [redacted]w[redacted]d, admitted for c-section on 06/22/16, declined having tubal ligation, but only had recently been restarted on descovy-ritonavir-atazanavir in the prior month after returning from [place]. She didn't have viral load taken during delivery but suspected to be elevated since in early November she had 75,000. Her son still is on ART, thus far tested negative. She is not having any fevers, no difficulty with healing of her c-section incision  She is still on descovy-atazanavir-ritonavir. Latest viral load is 104 on 07/10/16.  Her husband in HIV negative, but they have had unprotected sex/condom breaking.  adap is being processed  Outpatient Encounter Prescriptions as of 07/29/2016  Medication Sig  . acetaminophen (TYLENOL) 325 MG tablet Take 2 tablets (650 mg total) by mouth every 4 (four) hours as needed (for pain scale < 4).  . darunavir-cobicistat (PREZCOBIX) 800-150 MG tablet Take 1 tablet by mouth daily with breakfast. Swallow whole. Do NOT crush, break or chew tablets. Take with food.  Marland Kitchen emtricitabine-tenofovir AF (DESCOVY) 200-25 MG tablet Take 1 tablet by mouth daily.  . fexofenadine (ALLEGRA) 180 MG tablet Take 180 mg by mouth daily.  Marland Kitchen ibuprofen (ADVIL,MOTRIN) 200 MG tablet Take 200 mg by mouth every 6 (six) hours as needed for mild pain.  Marland Kitchen oxyCODONE-acetaminophen (PERCOCET/ROXICET) 5-325 MG tablet Take 1-2 tablets by mouth every 6 (six) hours as needed for severe pain. [place] instructions   No facility-administered encounter medications on file as of 07/29/2016.      Patient Active Problem List   Diagnosis Date Noted  . S/P cesarean section 06/22/2016  . ASCUS with positive high risk HPV 03/12/2016  . Abdominal wall hernia 03/12/2016  . HIV disease (HCC)  03/12/2016  . Supervision of high risk pregnancy, antepartum 02/21/2016  . Advanced maternal age in multigravida 02/21/2016  . Previous cesarean delivery affecting pregnancy, antepartum 02/21/2016  . Down syndrome in child of prior pregnancy, currently pregnant 02/21/2016     Health Maintenance Due  Topic Date Due  . TETANUS/TDAP  08/15/1999     Review of Systemsfatigue, insomnia, otherwise 10 point ros is negative Physical Exam  BP 110/74   Pulse 68   Temp 98.8 F (37.1 C) (Oral)   Ht 5\' 9"  (1.753 m)   Wt 193 lb 12.6 oz (87.9 kg)   Breastfeeding? No   BMI 28.62 kg/m  Physical Exam  Constitutional:  oriented to person, place, and time. appears well-developed and well-nourished. No distress.  HENT: [place]/AT, PERRLA, no scleral icterus Mouth/Throat: Oropharynx is clear and moist. No oropharyngeal exudate.  Cardiovascular: Normal rate, regular rhythm and normal heart sounds. Exam reveals no gallop and no friction rub.  No murmur heard.  Pulmonary/Chest: Effort normal and breath sounds normal. No respiratory distress.  has no wheezes.  Skin: Skin is warm and dry. No rash noted. No erythema.  Psychiatric: a normal mood and affect.  behavior is normal.    Lab Results  Component Value Date   CD4TCELL 26 (L) 05/22/2016   Lab Results  Component Value Date   CD4TABS 620 05/22/2016   CD4TABS 910 03/04/2016   Lab Results  Component Value Date   HIV1RNAQUANT 104 (H) 07/10/2016   Lab Results  Component Value Date   HEPBSAB NEG 03/04/2016   Lab  Results  Component Value Date   LABRPR Non Reactive 06/22/2016    CBC Lab Results  Component Value Date   WBC 16.4 (H) 06/23/2016   RBC 3.79 (L) 06/23/2016   HGB 10.7 (L) 06/23/2016   HCT 30.8 (L) 06/23/2016   PLT 269 06/23/2016   MCV 81.3 06/23/2016   MCH 28.2 06/23/2016   MCHC 34.7 06/23/2016   RDW 13.6 06/23/2016   LYMPHSABS 2,652 03/04/2016   MONOABS 546 03/04/2016   EOSABS 78 03/04/2016    BMET Lab Results    Component Value Date   NA 134 (L) 03/04/2016   K 4.1 03/04/2016   CL 106 03/04/2016   CO2 21 03/04/2016   GLUCOSE 78 03/04/2016   BUN 5 (L) 03/04/2016   CREATININE 0.46 (L) 03/04/2016   CALCIUM 7.9 (L) 03/04/2016   GFRNONAA >89 03/04/2016   GFRAA >89 03/04/2016      Assessment and Plan  hiv disease  = will change her to truvada-ritonavir-darunavir. Return in 3 wks.  Keep giving medication to baby. Followed at [facility]  Need to get husband tested since they have had unprotected sex, condom broke, while she was unprotected.  Will discuss birth control at next visit

## 2016-08-05 ENCOUNTER — Encounter: Payer: Self-pay | Admitting: Advanced Practice Midwife

## 2016-08-05 ENCOUNTER — Ambulatory Visit: Payer: Self-pay | Admitting: Advanced Practice Midwife

## 2016-08-05 ENCOUNTER — Ambulatory Visit (INDEPENDENT_AMBULATORY_CARE_PROVIDER_SITE_OTHER): Payer: Self-pay | Admitting: Advanced Practice Midwife

## 2016-08-05 DIAGNOSIS — B20 Human immunodeficiency virus [HIV] disease: Secondary | ICD-10-CM

## 2016-08-05 DIAGNOSIS — K0889 Other specified disorders of teeth and supporting structures: Secondary | ICD-10-CM

## 2016-08-05 DIAGNOSIS — M6281 Muscle weakness (generalized): Secondary | ICD-10-CM

## 2016-08-05 DIAGNOSIS — Z3009 Encounter for other general counseling and advice on contraception: Secondary | ICD-10-CM

## 2016-08-05 MED ORDER — EMTRICITABINE-TENOFOVIR AF 200-25 MG PO TABS: 1.0000 | ORAL_TABLET | Freq: Every day | ORAL | 8 refills | Status: DC

## 2016-08-05 MED ORDER — NORGESTIMATE-ETH ESTRADIOL 0.25-35 MG-MCG PO TABS: 1.0000 | ORAL_TABLET | Freq: Every day | ORAL | 11 refills | Status: DC

## 2016-08-05 MED ORDER — RITONAVIR 100 MG PO TABS: 100.0000 mg | ORAL_TABLET | Freq: Every day | ORAL | 11 refills | Status: DC

## 2016-08-05 MED ORDER — DARUNAVIR ETHANOLATE 800 MG PO TABS: 800.0000 mg | ORAL_TABLET | Freq: Every day | ORAL | 11 refills | Status: DC

## 2016-08-05 NOTE — Progress Notes (Signed)
Patient ID: Terri Padilla, female   DOB: 09/29/1980, 36 y.o.   MRN: [id number]  Subjective:    Terri Padilla is a 36 y.o. female who presents for a postpartum visit. She is six weeks postpartum.  The delivery was at 39 gestational weeks. Outcome: C-SECTION. Anesthesia: Epidual Postpartum course has been . Baby's course has been uncomplicated. Baby is feeding by bottle. Bleeding on cycle now. Bowel function is normal. Bladder function is normal. Patient is sexually active. Contraception method is none at this time. Postpartum depression screening: negative.   The following portions of the patient's history were reviewed and updated as appropriate: allergies, current medications, past family history, past medical history, past social history, past surgical history and problem list.  Review of Systems Pertinent items noted in HPI and remainder of comprehensive ROS otherwise negative.   Objective:    BP 108/82   Pulse 78   Wt 188 lb 12.8 oz (85.6 kg)   BMI 27.88 kg/m   VS reviewed, nursing note reviewed,  Constitutional: well developed, well nourished, no distress HEENT: normocephalic CV: normal rate Pulm/chest wall: normal effort Abdomen: soft Neuro: alert and oriented x 3 Skin: warm, dry Psych: affect normal  Assessment:      1. Postpartum examination following cesarean delivery --Pt doing well overall. Reports she is back to her normal activities.  2. General counseling and advice for contraceptive management --Pt and husband are using condoms r/t HIV diagnosis but are interested in another form of contraception. Discussed LARCs as most effective forms of contraception.  Pt does not desire IUD b/c she has relative who had complication.  Is interested in OCPs only.  No personal or family hx of DVT/PE.  Nonsmoker.  Age 36 is only risk factor.  Possibly less effective related to Norvir, HIV medication.  Rx sent to pharmacy.  Per Dr Erin FullingHarraway-Smith, pt to return to  office in one month for liver enzymes.  - norgestimate-ethinyl estradiol (ORTHO-CYCLEN,SPRINTEC,PREVIFEM) 0.25-35 MG-MCG tablet; Take 1 tablet by mouth daily.  Dispense: 1 Package; Refill: 11  3. Muscle weakness of extremity --Pt reports weakness of both legs since surgery. She has equal sensation and does not feel like legs are unstable or that she might fall. She feel weaker all over but more in her legs since the baby was born.   --Discussed how healing after 4th C/S might be slower and this may be normal. --Reviewed pt labs and hgb stable following C/S so no evidence of anemia. --Discussed physical therapy as an option but family wants to save costs so pt to start walking with her husband and will try some stretching exercises.   --Follow up with her primary care provider if symptom persists.  4.  HIV disease --Pt doing well.  She has f/u with ID and is on antiviral medication regimen.  5.  Tooth pain --Pt given contact information for dentists.     Plan:    1. Contraception: OCP (estrogen/progesterone) 2. Non Gyn concerns: Continue care with ID and f/u with primary care as needed.  Pt to contact dentist for follow up. 3. Follow up in our office in: 1 month for liver enzymes and then annually.

## 2016-08-05 NOTE — Progress Notes (Signed)
Subjective:     Duplicate note see previous note

## 2016-08-12 ENCOUNTER — Ambulatory Visit: Payer: Self-pay | Admitting: Certified Nurse Midwife

## 2016-08-14 ENCOUNTER — Ambulatory Visit: Payer: Self-pay | Admitting: Internal Medicine

## 2016-08-19 ENCOUNTER — Other Ambulatory Visit: Payer: Self-pay | Admitting: *Deleted

## 2016-08-19 ENCOUNTER — Ambulatory Visit (INDEPENDENT_AMBULATORY_CARE_PROVIDER_SITE_OTHER): Payer: Self-pay | Admitting: Internal Medicine

## 2016-08-19 ENCOUNTER — Encounter: Payer: Self-pay | Admitting: Internal Medicine

## 2016-08-19 VITALS — BP 108/69 | HR 63 | Temp 98.2°F | Wt 194.0 lb

## 2016-08-19 DIAGNOSIS — B20 Human immunodeficiency virus [HIV] disease: Secondary | ICD-10-CM

## 2016-08-19 MED ORDER — DARUNAVIR ETHANOLATE 800 MG PO TABS: 800.0000 mg | ORAL_TABLET | Freq: Every day | ORAL | 11 refills | Status: DC

## 2016-08-19 MED ORDER — ONDANSETRON 8 MG PO TBDP: 8.0000 mg | ORAL_TABLET | Freq: Three times a day (TID) | ORAL | 0 refills | Status: DC | PRN

## 2016-08-19 MED ORDER — ELVITEG-COBIC-EMTRICIT-TENOFAF 150-150-200-10 MG PO TABS: 1.0000 | ORAL_TABLET | Freq: Every day | ORAL | 11 refills | Status: DC

## 2016-08-19 NOTE — Progress Notes (Signed)
Patient ID: Terri Padilla, female   DOB: 02/20/1981, 36 y.o.   MRN: [id number]  HPI Terri Padilla is 36 yo F with HIV disease, CD 4 count of 620/VL 104, currently on descovy/DRVr since her pregnancy. Detectable viral load represents when she had restarted on ART however was likely viremic in 1000 at the time of birth of her baby. She states that she continues to give baby AZT and thus far testing is negative. She is rushed today and unable to finish complete visit. She states that she is fatigued with poor sleep due to feeding baby in the middle of the night  Outpatient Encounter Prescriptions as of 08/19/2016  Medication Sig  . acetaminophen (TYLENOL) 325 MG tablet Take 2 tablets (650 mg total) by mouth every 4 (four) hours as needed (for pain scale < 4).  . Darunavir Ethanolate (PREZISTA) 800 MG tablet Take 1 tablet (800 mg total) by mouth daily with breakfast.  . emtricitabine-tenofovir AF (DESCOVY) 200-25 MG tablet Take 1 tablet by mouth daily.  . fexofenadine (ALLEGRA) 180 MG tablet Take 180 mg by mouth daily.  Marland Kitchen. ibuprofen (ADVIL,MOTRIN) 200 MG tablet Take 200 mg by mouth every 6 (six) hours as needed for mild pain.  . ritonavir (NORVIR) 100 MG TABS tablet Take 1 tablet (100 mg total) by mouth daily.  . norgestimate-ethinyl estradiol (ORTHO-CYCLEN,SPRINTEC,PREVIFEM) 0.25-35 MG-MCG tablet Take 1 tablet by mouth daily. (Patient not taking: Reported on 08/19/2016)  . oxyCODONE-acetaminophen (PERCOCET/ROXICET) 5-325 MG tablet Take 1-2 tablets by mouth every 6 (six) hours as needed for severe pain. [place] instructions (Patient not taking: Reported on 08/19/2016)   No facility-administered encounter medications on file as of 08/19/2016.      Patient Active Problem List   Diagnosis Date Noted  . S/P cesarean section 06/22/2016  . ASCUS with positive high risk HPV 03/12/2016  . Abdominal wall hernia 03/12/2016  . HIV disease (HCC) 03/12/2016     Health Maintenance Due  Topic Date Due    . TETANUS/TDAP  08/15/1999     Review of Systems  Physical Exam   BP 108/69   Pulse 63   Temp 98.2 F (36.8 C) (Oral)   Wt 194 lb (88 kg)   BMI 28.65 kg/m    Lab Results  Component Value Date   CD4TCELL 26 (L) 05/22/2016   Lab Results  Component Value Date   CD4TABS 620 05/22/2016   CD4TABS 910 03/04/2016   Lab Results  Component Value Date   HIV1RNAQUANT 104 (H) 07/10/2016   Lab Results  Component Value Date   HEPBSAB NEG 03/04/2016   Lab Results  Component Value Date   LABRPR Non Reactive 06/22/2016    CBC Lab Results  Component Value Date   WBC 16.4 (H) 06/23/2016   RBC 3.79 (L) 06/23/2016   HGB 10.7 (L) 06/23/2016   HCT 30.8 (L) 06/23/2016   PLT 269 06/23/2016   MCV 81.3 06/23/2016   MCH 28.2 06/23/2016   MCHC 34.7 06/23/2016   RDW 13.6 06/23/2016   LYMPHSABS 2,652 03/04/2016   MONOABS 546 03/04/2016   EOSABS 78 03/04/2016    BMET Lab Results  Component Value Date   NA 134 (L) 03/04/2016   K 4.1 03/04/2016   CL 106 03/04/2016   CO2 21 03/04/2016   GLUCOSE 78 03/04/2016   BUN 5 (L) 03/04/2016   CREATININE 0.46 (L) 03/04/2016   CALCIUM 7.9 (L) 03/04/2016   GFRNONAA >89 03/04/2016   GFRAA >89 03/04/2016  Assessment and Plan hiv disease - poorly controlled, due to lack of access to meds while in [place]. -will change to genovya-darunavir. Will do labs at that time to see that she is virologically controlled  Spent 15 min with greater than 50% discussing importance of new regimen and adherence in french  rtc in 4 wk  For follow up

## 2016-09-03 ENCOUNTER — Encounter: Payer: Self-pay | Admitting: *Deleted

## 2016-09-03 ENCOUNTER — Ambulatory Visit: Payer: Self-pay | Admitting: Obstetrics and Gynecology

## 2016-09-03 ENCOUNTER — Other Ambulatory Visit: Payer: Self-pay

## 2016-09-03 NOTE — Progress Notes (Signed)
[facility] to [facility]. Will have registrar rescheedule and call her with new appt. Also needs lft's rechecked.

## 2016-09-04 ENCOUNTER — Ambulatory Visit: Payer: Self-pay

## 2016-09-18 ENCOUNTER — Encounter: Payer: Self-pay | Admitting: Obstetrics and Gynecology

## 2016-09-18 ENCOUNTER — Ambulatory Visit: Payer: Self-pay | Admitting: Obstetrics and Gynecology

## 2016-09-18 NOTE — Progress Notes (Signed)
Patient did not keep colpo appointment for 09/18/2016. Inbasket message sent to have staff call patient.  Terri Padilla, Jr MD Attending [facility](Faculty Practice)

## 2016-09-25 ENCOUNTER — Ambulatory Visit: Payer: Self-pay | Admitting: Internal Medicine

## 2016-09-25 ENCOUNTER — Telehealth: Payer: Self-pay | Admitting: *Deleted

## 2016-09-25 NOTE — Telephone Encounter (Signed)
Called patient with [place] interpreter [phone]. Patient's husband answered the phone, he spoke [place]. I told him that his wife had missed 2 appointments and it was extremely important that she get rescheduled and come in. Husband stated the reason for the missed appointments was the visitor restrictions and she could not bring her kids. I informed him that the visitor restrictions ended tomorrow morning so that should not be and issue. Patient rescheduled for Fri, Mar 23 @ 0920. Informed pt husband who voiced understanding.

## 2016-10-03 ENCOUNTER — Ambulatory Visit: Payer: Self-pay | Admitting: Obstetrics & Gynecology

## 2016-10-17 ENCOUNTER — Encounter: Payer: Self-pay | Admitting: Internal Medicine

## 2016-11-11 ENCOUNTER — Encounter: Payer: Self-pay | Admitting: Internal Medicine

## 2016-11-11 ENCOUNTER — Ambulatory Visit (INDEPENDENT_AMBULATORY_CARE_PROVIDER_SITE_OTHER): Payer: Self-pay | Admitting: Internal Medicine

## 2016-11-11 VITALS — BP 105/64 | HR 86 | Temp 98.8°F | Ht 69.0 in | Wt 198.0 lb

## 2016-11-11 DIAGNOSIS — B20 Human immunodeficiency virus [HIV] disease: Secondary | ICD-10-CM

## 2016-11-11 NOTE — Progress Notes (Signed)
RFV: follow up for hiv disease Patient ID: Terri Padilla, female   DOB: Aug 27, 1980, 36 y.o.   MRN: [id number]  HPI Terri Padilla is here for hiv follow up. She is here with 3 of her youngest children. She states that she is still on her HIV regimen that she took during her pregnancy but did not know that her paperwork for [facility] had passed. She is otherwise doing ok.  Outpatient Encounter Prescriptions as of 11/11/2016  Medication Sig  . acetaminophen (TYLENOL) 325 MG tablet Take 2 tablets (650 mg total) by mouth every 4 (four) hours as needed (for pain scale < 4).  . fexofenadine (ALLEGRA) 180 MG tablet Take 180 mg by mouth daily.  Marland Kitchen ibuprofen (ADVIL,MOTRIN) 200 MG tablet Take 200 mg by mouth every 6 (six) hours as needed for mild pain.  . norgestimate-ethinyl estradiol (ORTHO-CYCLEN,SPRINTEC,PREVIFEM) 0.25-35 MG-MCG tablet Take 1 tablet by mouth daily.  . Darunavir Ethanolate (PREZISTA) 800 MG tablet Take 1 tablet (800 mg total) by mouth daily with breakfast. (Patient not taking: Reported on 11/11/2016)  . elvitegravir-cobicistat-emtricitabine-tenofovir (GENVOYA) 150-150-200-10 MG TABS tablet Take 1 tablet by mouth daily with breakfast. (Patient not taking: Reported on 11/11/2016)  . ondansetron (ZOFRAN ODT) 8 MG disintegrating tablet Take 1 tablet (8 mg total) by mouth every 8 (eight) hours as needed for nausea or vomiting. (Patient not taking: Reported on 11/11/2016)  . oxyCODONE-acetaminophen (PERCOCET/ROXICET) 5-325 MG tablet Take 1-2 tablets by mouth every 6 (six) hours as needed for severe pain. [place] instructions (Patient not taking: Reported on 08/19/2016)   No facility-administered encounter medications on file as of 11/11/2016.      Patient Active Problem List   Diagnosis Date Noted  . S/P cesarean section 06/22/2016  . ASCUS with positive high risk HPV 03/12/2016  . Abdominal wall hernia 03/12/2016  . HIV disease (HCC) 03/12/2016     Health Maintenance Due  Topic Date  Due  . TETANUS/TDAP  08/15/1999     Review of Systems Review of Systems  Constitutional: Negative for fever, chills, diaphoresis, activity change, appetite change, fatigue and unexpected weight change.  HENT: Negative for congestion, sore throat, rhinorrhea, sneezing, trouble swallowing and sinus pressure.  Eyes: Negative for photophobia and visual disturbance.  Respiratory: Negative for cough, chest tightness, shortness of breath, wheezing and stridor.  Cardiovascular: Negative for chest pain, palpitations and leg swelling.  Gastrointestinal: Negative for nausea, vomiting, abdominal pain, diarrhea, constipation, blood in stool, abdominal distention and anal bleeding.  Genitourinary: Negative for dysuria, hematuria, flank pain and difficulty urinating.  Musculoskeletal: Negative for myalgias, back pain, joint swelling, arthralgias and gait problem.  Skin: Negative for color change, pallor, rash and wound.  Neurological: Negative for dizziness, tremors, weakness and light-headedness.  Hematological: Negative for adenopathy. Does not bruise/bleed easily.  Psychiatric/Behavioral: Negative for behavioral problems, confusion, sleep disturbance, dysphoric mood, decreased concentration and agitation.    Physical Exam   BP 105/64   Pulse 86   Temp 98.8 F (37.1 C) (Oral)   Ht  (1.753 m)   Wt 198 lb (89.8 kg)   LMP 11/11/2016 (Exact Date)   Breastfeeding? No   BMI 29.24 kg/m   gen = a xo by 4 in NAD HEENT = EOMI, no scleral icterus, no OP erythema abd = NTND Skin = no signs of rash Psych = appropriate,at her baseline Neuro = CN 2-12 grossly intact.  Lab Results  Component Value Date   CD4TCELL 26 (L) 05/22/2016   Lab Results  Component Value  Date   CD4TABS 620 05/22/2016   CD4TABS 910 03/04/2016   Lab Results  Component Value Date   HIV1RNAQUANT 104 (H) 07/10/2016   Lab Results  Component Value Date   HEPBSAB NEG 03/04/2016   Lab Results  Component Value Date    LABRPR Non Reactive 06/22/2016    CBC Lab Results  Component Value Date   WBC 16.4 (H) 06/23/2016   RBC 3.79 (L) 06/23/2016   HGB 10.7 (L) 06/23/2016   HCT 30.8 (L) 06/23/2016   PLT 269 06/23/2016   MCV 81.3 06/23/2016   MCH 28.2 06/23/2016   MCHC 34.7 06/23/2016   RDW 13.6 06/23/2016   LYMPHSABS 2,652 03/04/2016   MONOABS 546 03/04/2016   EOSABS 78 03/04/2016    BMET Lab Results  Component Value Date   NA 134 (L) 03/04/2016   K 4.1 03/04/2016   CL 106 03/04/2016   CO2 21 03/04/2016   GLUCOSE 78 03/04/2016   BUN 5 (L) 03/04/2016   CREATININE 0.46 (L) 03/04/2016   CALCIUM 7.9 (L) 03/04/2016   GFRNONAA >89 03/04/2016   GFRAA >89 03/04/2016      Assessment and Plan hiv disease = will switch her off of pregnancy regimen Start on genvoya-DRV  Need her to come back in 4 wk for blood work on her new regimen  She is unable to do blood work today since she is late for her pediatric appt

## 2017-06-26 ENCOUNTER — Ambulatory Visit: Payer: Self-pay

## 2017-06-30 ENCOUNTER — Other Ambulatory Visit: Payer: Self-pay | Admitting: Internal Medicine

## 2017-06-30 MED ORDER — ELVITEG-COBIC-EMTRICIT-TENOFAF 150-150-200-10 MG PO TABS: 1.0000 | ORAL_TABLET | Freq: Every day | ORAL | 11 refills | Status: DC

## 2017-06-30 MED ORDER — DARUNAVIR ETHANOLATE 800 MG PO TABS: 800.0000 mg | ORAL_TABLET | Freq: Every day | ORAL | 11 refills | Status: DC

## 2017-07-01 ENCOUNTER — Encounter: Payer: Self-pay | Admitting: Internal Medicine

## 2017-08-13 ENCOUNTER — Other Ambulatory Visit: Payer: Self-pay | Admitting: Pediatrics

## 2017-08-13 MED ORDER — OSELTAMIVIR PHOSPHATE 75 MG PO CAPS: 75.0000 mg | ORAL_CAPSULE | Freq: Every day | ORAL | 0 refills | Status: AC

## 2017-08-13 NOTE — Progress Notes (Signed)
Daughter with influenza A sick < 24 hours.  Post-exposure prophylaxis written for mother.  Dory PeruKirsten R Reeshemah Nazaryan, MD

## 2017-08-25 ENCOUNTER — Other Ambulatory Visit: Payer: Self-pay | Admitting: Internal Medicine

## 2017-08-29 ENCOUNTER — Other Ambulatory Visit: Payer: Self-pay | Admitting: Internal Medicine

## 2017-09-03 ENCOUNTER — Other Ambulatory Visit: Payer: Self-pay | Admitting: Internal Medicine

## 2017-10-07 ENCOUNTER — Other Ambulatory Visit: Payer: Self-pay | Admitting: Internal Medicine

## 2017-11-01 ENCOUNTER — Other Ambulatory Visit: Payer: Self-pay | Admitting: Internal Medicine

## 2017-12-08 ENCOUNTER — Other Ambulatory Visit: Payer: Self-pay

## 2017-12-08 ENCOUNTER — Other Ambulatory Visit: Payer: Self-pay | Admitting: *Deleted

## 2017-12-08 ENCOUNTER — Ambulatory Visit: Payer: Self-pay

## 2017-12-08 DIAGNOSIS — B20 Human immunodeficiency virus [HIV] disease: Secondary | ICD-10-CM

## 2017-12-08 DIAGNOSIS — Z113 Encounter for screening for infections with a predominantly sexual mode of transmission: Secondary | ICD-10-CM

## 2017-12-08 DIAGNOSIS — Z79899 Other long term (current) drug therapy: Secondary | ICD-10-CM

## 2017-12-08 MED ORDER — DARUNAVIR ETHANOLATE 800 MG PO TABS: 800.0000 mg | ORAL_TABLET | Freq: Every day | ORAL | 2 refills | Status: DC

## 2017-12-08 MED ORDER — ELVITEG-COBIC-EMTRICIT-TENOFAF 150-150-200-10 MG PO TABS: 1.0000 | ORAL_TABLET | Freq: Every day | ORAL | 2 refills | Status: DC

## 2017-12-09 LAB — COMPLETE METABOLIC PANEL WITH GFR
AG RATIO: 1.3 (calc) (ref 1.0–2.5)
ALBUMIN MSPROF: 4 g/dL (ref 3.6–5.1)
ALKALINE PHOSPHATASE (APISO): 52 U/L (ref 33–115)
ALT: 12 U/L (ref 6–29)
AST: 14 U/L (ref 10–30)
BILIRUBIN TOTAL: 0.4 mg/dL (ref 0.2–1.2)
BUN: 8 mg/dL (ref 7–25)
CHLORIDE: 107 mmol/L (ref 98–110)
CO2: 23 mmol/L (ref 20–32)
Calcium: 9.3 mg/dL (ref 8.6–10.2)
Creat: 0.6 mg/dL (ref 0.50–1.10)
GFR, Est African American: 135 mL/min/{1.73_m2} (ref >=60)
GFR, Est Non African American: 116 mL/min/{1.73_m2} (ref >=60)
GLOBULIN: 3.1 g/dL (ref 1.9–3.7)
Glucose, Bld: 98 mg/dL (ref 65–99)
POTASSIUM: 4 mmol/L (ref 3.5–5.3)
SODIUM: 138 mmol/L (ref 135–146)
Total Protein: 7.1 g/dL (ref 6.1–8.1)

## 2017-12-09 LAB — CBC WITH DIFFERENTIAL/PLATELET
BASOS ABS: 30 {cells}/uL (ref 0–200)
Basophils Relative: 0.5 %
EOS ABS: 59 {cells}/uL (ref 15–500)
Eosinophils Relative: 1 %
HEMATOCRIT: 34.5 % — AB (ref 35.0–45.0)
HEMOGLOBIN: 11.6 g/dL — AB (ref 11.7–15.5)
Lymphs Abs: 2962 cells/uL (ref 850–3900)
MCH: 27.2 pg (ref 27.0–33.0)
MCHC: 33.6 g/dL (ref 32.0–36.0)
MCV: 80.8 fL (ref 80.0–100.0)
MPV: 11.2 fL (ref 7.5–12.5)
Monocytes Relative: 5.9 %
NEUTROS ABS: 2502 {cells}/uL (ref 1500–7800)
NEUTROS PCT: 42.4 %
Platelets: 305 10*3/uL (ref 140–400)
RBC: 4.27 10*6/uL (ref 3.80–5.10)
RDW: 12.9 % (ref 11.0–15.0)
Total Lymphocyte: 50.2 %
WBC: 5.9 10*3/uL (ref 3.8–10.8)
WBCMIX: 348 {cells}/uL (ref 200–950)

## 2017-12-09 LAB — LIPID PANEL
Cholesterol: 183 mg/dL (ref <200)
HDL: 47 mg/dL — ABNORMAL LOW (ref >50)
LDL CHOLESTEROL (CALC): 121 mg/dL — AB
Non-HDL Cholesterol (Calc): 136 mg/dL (calc) — ABNORMAL HIGH (ref <130)
Total CHOL/HDL Ratio: 3.9 (calc) (ref <5.0)
Triglycerides: 63 mg/dL (ref <150)

## 2017-12-09 LAB — T-HELPER CELL (CD4) - (RCID CLINIC ONLY)
CD4 % Helper T Cell: 38 % (ref 33–55)
CD4 T Cell Abs: 1220 /uL (ref 400–2700)

## 2017-12-09 LAB — RPR: RPR: NONREACTIVE

## 2017-12-10 LAB — HIV-1 RNA QUANT-NO REFLEX-BLD
HIV 1 RNA Quant: <20 copies/mL — AB
HIV-1 RNA Quant, Log: <1.3 Log copies/mL — AB

## 2017-12-10 LAB — URINE CYTOLOGY ANCILLARY ONLY
CHLAMYDIA, DNA PROBE: NEGATIVE
Neisseria Gonorrhea: NEGATIVE

## 2017-12-16 ENCOUNTER — Encounter: Payer: Self-pay | Admitting: Internal Medicine

## 2018-01-13 ENCOUNTER — Telehealth: Payer: Self-pay

## 2018-01-13 ENCOUNTER — Encounter: Payer: Self-pay | Admitting: Internal Medicine

## 2018-01-13 ENCOUNTER — Ambulatory Visit (INDEPENDENT_AMBULATORY_CARE_PROVIDER_SITE_OTHER): Payer: Self-pay | Admitting: Internal Medicine

## 2018-01-13 VITALS — BP 109/76 | HR 61 | Temp 98.1°F | Wt 189.0 lb

## 2018-01-13 DIAGNOSIS — B20 Human immunodeficiency virus [HIV] disease: Secondary | ICD-10-CM

## 2018-01-13 DIAGNOSIS — Z79899 Other long term (current) drug therapy: Secondary | ICD-10-CM

## 2018-01-13 DIAGNOSIS — Z789 Other specified health status: Secondary | ICD-10-CM

## 2018-01-13 MED ORDER — ELVITEG-COBIC-EMTRICIT-TENOFAF 150-150-200-10 MG PO TABS: 1.0000 | ORAL_TABLET | Freq: Every day | ORAL | 11 refills | Status: DC

## 2018-01-13 MED ORDER — DARUNAVIR ETHANOLATE 800 MG PO TABS: 800.0000 mg | ORAL_TABLET | Freq: Every day | ORAL | 11 refills | Status: DC

## 2018-01-13 NOTE — Progress Notes (Signed)
RFV: follow up for hiv disease  Patient ID: Terri Padilla, female   DOB: 06/11/1981, 37 y.o.   MRN: [id number]  HPI Harold BarbanJemilla is a 37yo F iwthi hiv diseaes, currently on genvoya-DRV, cd 4 count of 1220/VL<20 in May 2019. She reports doing well. No issues with her health except that she has ran out of genvoya and has been taking darunavir monotherapy for roughly 8 days.  Outpatient Encounter Medications as of 01/13/2018  Medication Sig  . darunavir (PREZISTA) 800 MG tablet Take 1 tablet (800 mg total) by mouth daily with breakfast.  . elvitegravir-cobicistat-emtricitabine-tenofovir (GENVOYA) 150-150-200-10 MG TABS tablet Take 1 tablet by mouth daily with breakfast.  . acetaminophen (TYLENOL) 325 MG tablet Take 2 tablets (650 mg total) by mouth every 4 (four) hours as needed (for pain scale < 4). (Patient not taking: Reported on 01/13/2018)  . fexofenadine (ALLEGRA) 180 MG tablet Take 180 mg by mouth daily.  Marland Kitchen. ibuprofen (ADVIL,MOTRIN) 200 MG tablet Take 200 mg by mouth every 6 (six) hours as needed for mild pain.  . norgestimate-ethinyl estradiol (ORTHO-CYCLEN,SPRINTEC,PREVIFEM) 0.25-35 MG-MCG tablet Take 1 tablet by mouth daily. (Patient not taking: Reported on 01/13/2018)  . ondansetron (ZOFRAN ODT) 8 MG disintegrating tablet Take 1 tablet (8 mg total) by mouth every 8 (eight) hours as needed for nausea or vomiting. (Patient not taking: Reported on 11/11/2016)  . oxyCODONE-acetaminophen (PERCOCET/ROXICET) 5-325 MG tablet Take 1-2 tablets by mouth every 6 (six) hours as needed for severe pain. [place] instructions (Patient not taking: Reported on 01/13/2018)   No facility-administered encounter medications on file as of 01/13/2018.      Patient Active Problem List   Diagnosis Date Noted  . S/P cesarean section 06/22/2016  . ASCUS with positive high risk HPV 03/12/2016  . Abdominal wall hernia 03/12/2016  . HIV disease (HCC) 03/12/2016     Health Maintenance Due  Topic Date Due  .  Janet BerlinETANUS/TDAP  08/15/1999    Social History   Tobacco Use  . Smoking status: Never Smoker  . Smokeless tobacco: Never Used  Substance Use Topics  . Alcohol use: No  . Drug use: No   Review of Systems Review of Systems  Constitutional: Negative for fever, chills, diaphoresis, activity change, appetite change, fatigue and unexpected weight change.  HENT: Negative for congestion, sore throat, rhinorrhea, sneezing, trouble swallowing and sinus pressure.  Eyes: Negative for photophobia and visual disturbance.  Respiratory: Negative for cough, chest tightness, shortness of breath, wheezing and stridor.  Cardiovascular: Negative for chest pain, palpitations and leg swelling.  Gastrointestinal: Negative for nausea, vomiting, abdominal pain, diarrhea, constipation, blood in stool, abdominal distention and anal bleeding.  Genitourinary: Negative for dysuria, hematuria, flank pain and difficulty urinating.  Musculoskeletal: Negative for myalgias, back pain, joint swelling, arthralgias and gait problem.  Skin: Negative for color change, pallor, rash and wound.  Neurological: Negative for dizziness, tremors, weakness and light-headedness.  Hematological: Negative for adenopathy. Does not bruise/bleed easily.  Psychiatric/Behavioral: Negative for behavioral problems, confusion, sleep disturbance, dysphoric mood, decreased concentration and agitation.    Physical Exam   BP 109/76   Pulse 61   Temp 98.1 F (36.7 C) (Oral)   Wt 189 lb (85.7 kg)   LMP 01/04/2018 (Approximate)   BMI 27.91 kg/m   Physical Exam  Constitutional:  oriented to person, place, and time. appears well-developed and well-nourished. No distress.  HENT: [place]/AT, PERRLA, no scleral icterus Mouth/Throat: Oropharynx is clear and moist. No oropharyngeal exudate.  Cardiovascular: Normal rate, regular rhythm and  normal heart sounds. Exam reveals no gallop and no friction rub.  No murmur heard.  Pulmonary/Chest: Effort normal and  breath sounds normal. No respiratory distress.  has no wheezes.  Neck = supple, no nuchal rigidity Abdominal: Soft. Bowel sounds are normal.  exhibits no distension. There is no tenderness.  Lymphadenopathy: no cervical adenopathy. No axillary adenopathy Neurological: alert and oriented to person, place, and time.  Skin: Skin is warm and dry. No rash noted. No erythema.  Psychiatric: a normal mood and affect.  behavior is normal.   Lab Results  Component Value Date   CD4TCELL 38 12/08/2017   Lab Results  Component Value Date   CD4TABS 1,220 12/08/2017   CD4TABS 620 05/22/2016   CD4TABS 910 03/04/2016   Lab Results  Component Value Date   HIV1RNAQUANT <20 DETECTED (A) 12/08/2017   Lab Results  Component Value Date   HEPBSAB NEG 03/04/2016   Lab Results  Component Value Date   LABRPR NON-REACTIVE 12/08/2017    CBC Lab Results  Component Value Date   WBC 5.9 12/08/2017   RBC 4.27 12/08/2017   HGB 11.6 (L) 12/08/2017   HCT 34.5 (L) 12/08/2017   PLT 305 12/08/2017   MCV 80.8 12/08/2017   MCH 27.2 12/08/2017   MCHC 33.6 12/08/2017   RDW 12.9 12/08/2017   LYMPHSABS 2,962 12/08/2017   MONOABS 546 03/04/2016   EOSABS 59 12/08/2017    BMET Lab Results  Component Value Date   NA 138 12/08/2017   K 4.0 12/08/2017   CL 107 12/08/2017   CO2 23 12/08/2017   GLUCOSE 98 12/08/2017   BUN 8 12/08/2017   CREATININE 0.60 12/08/2017   CALCIUM 9.3 12/08/2017   GFRNONAA 116 12/08/2017   GFRAA 135 12/08/2017      Assessment and Plan  hiv - ran out of genvoya and has been on darunavir on its own for 8 days!Troubleshoot at pharmacy  Long term medication = cr function is stable. No need to change meds  Health maintenance =Needs prevnar 13 at next visit  Gave 25 min of adherence counseling on how to take meds

## 2018-01-13 NOTE — Telephone Encounter (Signed)
Called pharmacy to ask and see if pt would be able to pick up her Genvoya today. Pt stated during visit that she was only given her Prevista and not Genvoya. Dr. Drue SecondSnider asked if I could call the pharmacy to see what is happening and to see if she would be able to pick up her medication today, Pharmacist stated that their was an error on their side, but did see the refills sent in today for both medications. Pharmacist stated pt would be able to pick them up at 5:30. Asked to see if pharmacist would be able to have refills for both medications dispensed at same time to avoid any complications or errors with refills. Pharmacist stated she would be able to do this for pt.   Informed pt that she would be able to [facility]. Pt verbalized understanding and is aware that she should be taking two medications together and not one. Lorenso CourierJose L Ludwin Flahive, [place]

## 2018-01-15 ENCOUNTER — Encounter: Payer: Self-pay | Admitting: Internal Medicine

## 2018-04-15 ENCOUNTER — Ambulatory Visit: Payer: Self-pay | Admitting: Internal Medicine

## 2018-04-21 ENCOUNTER — Ambulatory Visit (INDEPENDENT_AMBULATORY_CARE_PROVIDER_SITE_OTHER): Payer: Self-pay | Admitting: Internal Medicine

## 2018-04-21 VITALS — BP 98/65 | HR 82 | Temp 97.7°F | Wt 192.0 lb

## 2018-04-21 DIAGNOSIS — B20 Human immunodeficiency virus [HIV] disease: Secondary | ICD-10-CM

## 2018-04-21 DIAGNOSIS — Z23 Encounter for immunization: Secondary | ICD-10-CM

## 2018-04-21 NOTE — Progress Notes (Signed)
RFV: follow up for hiv disease  Patient ID: Terri Padilla, female   DOB: 05-02-1981, 37 y.o.   MRN: [id number]  HPI Doing well. LMP 10/8. Has been taking meds reliably. She reports that her kids are doing well, she has one daughter with trisomy 53. She would like to go back to school for nursing when she gets her green card/citizenship. She is married to an [place] citizen. Her kids have [place] citizenship but not her own. She is wondering if we knew if any lawyers to help with her citizenship  Outpatient Encounter Medications as of 04/21/2018  Medication Sig  . darunavir (PREZISTA) 800 MG tablet Take 1 tablet (800 mg total) by mouth daily with breakfast.  . darunavir (PREZISTA) 800 MG tablet Take 1 tablet (800 mg total) by mouth daily.  Marland Kitchen elvitegravir-cobicistat-emtricitabine-tenofovir (GENVOYA) 150-150-200-10 MG TABS tablet Take 1 tablet by mouth daily with breakfast.  . fexofenadine (ALLEGRA) 180 MG tablet Take 180 mg by mouth daily.  Marland Kitchen ibuprofen (ADVIL,MOTRIN) 200 MG tablet Take 200 mg by mouth every 6 (six) hours as needed for mild pain.  . norgestimate-ethinyl estradiol (ORTHO-CYCLEN,SPRINTEC,PREVIFEM) 0.25-35 MG-MCG tablet Take 1 tablet by mouth daily.  . ondansetron (ZOFRAN ODT) 8 MG disintegrating tablet Take 1 tablet (8 mg total) by mouth every 8 (eight) hours as needed for nausea or vomiting.  Marland Kitchen oxyCODONE-acetaminophen (PERCOCET/ROXICET) 5-325 MG tablet Take 1-2 tablets by mouth every 6 (six) hours as needed for severe pain. [place] instructions  . acetaminophen (TYLENOL) 325 MG tablet Take 2 tablets (650 mg total) by mouth every 4 (four) hours as needed (for pain scale < 4). (Patient not taking: Reported on 01/13/2018)   No facility-administered encounter medications on file as of 04/21/2018.      Patient Active Problem List   Diagnosis Date Noted  . S/P cesarean section 06/22/2016  . ASCUS with positive high risk HPV 03/12/2016  . Abdominal wall hernia 03/12/2016  . HIV  disease (HCC) 03/12/2016     Health Maintenance Due  Topic Date Due  . TETANUS/TDAP  08/15/1999  . INFLUENZA VACCINE  02/11/2018   Social History   Tobacco Use  . Smoking status: Never Smoker  . Smokeless tobacco: Never Used  Substance Use Topics  . Alcohol use: No  . Drug use: No    Review of Systems Review of Systems  Constitutional: Negative for fever, chills, diaphoresis, activity change, appetite change, fatigue and unexpected weight change.  HENT: Negative for congestion, sore throat, rhinorrhea, sneezing, trouble swallowing and sinus pressure.  Eyes: Negative for photophobia and visual disturbance.  Respiratory: Negative for cough, chest tightness, shortness of breath, wheezing and stridor.  Cardiovascular: Negative for chest pain, palpitations and leg swelling.  Gastrointestinal: Negative for nausea, vomiting, abdominal pain, diarrhea, constipation, blood in stool, abdominal distention and anal bleeding.  Genitourinary: Negative for dysuria, hematuria, flank pain and difficulty urinating.  Musculoskeletal: Negative for myalgias, back pain, joint swelling, arthralgias and gait problem.  Skin: Negative for color change, pallor, rash and wound.  Neurological: Negative for dizziness, tremors, weakness and light-headedness.  Hematological: Negative for adenopathy. Does not bruise/bleed easily.  Psychiatric/Behavioral: Negative for behavioral problems, confusion, sleep disturbance, dysphoric mood, decreased concentration and agitation.    Physical Exam   BP 98/65   Pulse 82   Temp 97.7 F (36.5 C) (Oral)   Wt 192 lb (87.1 kg)   LMP 04/16/2018   BMI 28.35 kg/m   Physical Exam  Constitutional:  oriented to person, place, and time. appears well-developed  and well-nourished. No distress.  HENT: [place]/AT, PERRLA, no scleral icterus Mouth/Throat: Oropharynx is clear and moist. No oropharyngeal exudate.  Cardiovascular: Normal rate, regular rhythm and normal heart sounds.  Exam reveals no gallop and no friction rub.  No murmur heard.  Pulmonary/Chest: Effort normal and breath sounds normal. No respiratory distress.  has no wheezes.  Neck = supple, no nuchal rigidity Abdominal: Soft. Bowel sounds are normal.  exhibits no distension. There is no tenderness.  Lymphadenopathy: no cervical adenopathy. No axillary adenopathy Neurological: alert and oriented to person, place, and time.  Skin: Skin is warm and dry. No rash noted. No erythema.  Psychiatric: a normal mood and affect.  behavior is normal.   Lab Results  Component Value Date   CD4TCELL 38 12/08/2017   Lab Results  Component Value Date   CD4TABS 1,220 12/08/2017   CD4TABS 620 05/22/2016   CD4TABS 910 03/04/2016   Lab Results  Component Value Date   HIV1RNAQUANT <20 DETECTED (A) 12/08/2017   Lab Results  Component Value Date   HEPBSAB NEG 03/04/2016   Lab Results  Component Value Date   LABRPR NON-REACTIVE 12/08/2017    CBC Lab Results  Component Value Date   WBC 5.9 12/08/2017   RBC 4.27 12/08/2017   HGB 11.6 (L) 12/08/2017   HCT 34.5 (L) 12/08/2017   PLT 305 12/08/2017   MCV 80.8 12/08/2017   MCH 27.2 12/08/2017   MCHC 33.6 12/08/2017   RDW 12.9 12/08/2017   LYMPHSABS 2,962 12/08/2017   MONOABS 546 03/04/2016   EOSABS 59 12/08/2017    BMET Lab Results  Component Value Date   NA 138 12/08/2017   K 4.0 12/08/2017   CL 107 12/08/2017   CO2 23 12/08/2017   GLUCOSE 98 12/08/2017   BUN 8 12/08/2017   CREATININE 0.60 12/08/2017   CALCIUM 9.3 12/08/2017   GFRNONAA 116 12/08/2017   GFRAA 135 12/08/2017      Assessment and Plan  hiv disease = will refill her hiv regimen and Will check labs to see if undetectable  Health maintenance = Will get pneumonia vaccine plus flu vaccine  Try to find lawyer's service for getting citizenship/green card

## 2018-04-23 LAB — BASIC METABOLIC PANEL
BUN: 8 mg/dL (ref 7–25)
CO2: 28 mmol/L (ref 20–32)
CREATININE: 0.73 mg/dL (ref 0.50–1.10)
Calcium: 9.2 mg/dL (ref 8.6–10.2)
Chloride: 103 mmol/L (ref 98–110)
GLUCOSE: 97 mg/dL (ref 65–99)
Potassium: 3.9 mmol/L (ref 3.5–5.3)
Sodium: 137 mmol/L (ref 135–146)

## 2018-04-23 LAB — CBC WITH DIFFERENTIAL/PLATELET
BASOS PCT: 0.2 %
Basophils Absolute: 17 cells/uL (ref 0–200)
Eosinophils Absolute: 183 cells/uL (ref 15–500)
Eosinophils Relative: 2.2 %
HCT: 35.9 % (ref 35.0–45.0)
Hemoglobin: 11.9 g/dL (ref 11.7–15.5)
Lymphs Abs: 3843 cells/uL (ref 850–3900)
MCH: 27.2 pg (ref 27.0–33.0)
MCHC: 33.1 g/dL (ref 32.0–36.0)
MCV: 82.2 fL (ref 80.0–100.0)
MONOS PCT: 6.3 %
MPV: 11.6 fL (ref 7.5–12.5)
Neutro Abs: 3735 cells/uL (ref 1500–7800)
Neutrophils Relative %: 45 %
PLATELETS: 329 10*3/uL (ref 140–400)
RBC: 4.37 10*6/uL (ref 3.80–5.10)
RDW: 12.9 % (ref 11.0–15.0)
TOTAL LYMPHOCYTE: 46.3 %
WBC mixed population: 523 cells/uL (ref 200–950)
WBC: 8.3 10*3/uL (ref 3.8–10.8)

## 2018-04-23 LAB — T-HELPER CELL (CD4) - (RCID CLINIC ONLY)
CD4 % Helper T Cell: 39 % (ref 33–55)
CD4 T Cell Abs: 1570 /uL (ref 400–2700)

## 2018-04-23 LAB — HIV-1 RNA QUANT-NO REFLEX-BLD
HIV 1 RNA Quant: <20 copies/mL — AB
HIV-1 RNA QUANT, LOG: DETECTED {Log_copies}/mL — AB

## 2018-07-26 ENCOUNTER — Encounter: Payer: Self-pay | Admitting: Internal Medicine

## 2018-07-26 ENCOUNTER — Telehealth: Payer: Self-pay

## 2018-07-26 ENCOUNTER — Ambulatory Visit (INDEPENDENT_AMBULATORY_CARE_PROVIDER_SITE_OTHER): Payer: Self-pay | Admitting: Internal Medicine

## 2018-07-26 VITALS — BP 98/67 | HR 74 | Temp 98.0°F | Wt 200.0 lb

## 2018-07-26 DIAGNOSIS — Z23 Encounter for immunization: Secondary | ICD-10-CM

## 2018-07-26 DIAGNOSIS — Z3009 Encounter for other general counseling and advice on contraception: Secondary | ICD-10-CM

## 2018-07-26 DIAGNOSIS — Z3A01 Less than 8 weeks gestation of pregnancy: Secondary | ICD-10-CM

## 2018-07-26 DIAGNOSIS — Z7189 Other specified counseling: Secondary | ICD-10-CM

## 2018-07-26 LAB — POCT URINE PREGNANCY: Preg Test, Ur: POSITIVE — AB

## 2018-07-26 MED ORDER — PRENATAL VITAMINS 0.8 MG PO TABS: 1.0000 | ORAL_TABLET | Freq: Every day | ORAL | 2 refills | Status: DC

## 2018-07-26 MED ORDER — FOLIC ACID 1 MG PO TABS: 1.0000 mg | ORAL_TABLET | Freq: Every day | ORAL | 1 refills | Status: DC

## 2018-07-26 MED ORDER — NORGESTIMATE-ETH ESTRADIOL 0.25-35 MG-MCG PO TABS: 1.0000 | ORAL_TABLET | Freq: Every day | ORAL | 11 refills | Status: DC

## 2018-07-26 NOTE — Telephone Encounter (Signed)
Per Dr. Drue Second called pharmacy to cancel prescription for patient's birth control. Spoke with pharmacist who was able to take my call and cancel prescription.  Lorenso Courier, [place]

## 2018-07-26 NOTE — Patient Instructions (Addendum)
   [facility]  [address] [place], [place], [place] [address]  Phone: [phone] ---------------------------------------------------------------------------- Commence a prende : 1) prenatal multivitamin plus 2) folic acid

## 2018-07-26 NOTE — Progress Notes (Signed)
Patient ID: Terri Padilla, female   DOB: 1981-02-19, 38 y.o.   MRN: [id number]  HPI Harold Barban is a HIV disease, Cd 4 count 1570/VL<20 on genvoya plus darunavir. She is Doing well with adherence. She would like to start birth control instead of using condoms but she has missed her menses in December. She reports having unprotected sex once or twice in November/december. She is wanting to get on birth control.   We discussed   Outpatient Encounter Medications as of 07/26/2018  Medication Sig  . darunavir (PREZISTA) 800 MG tablet Take 1 tablet (800 mg total) by mouth daily with breakfast.  . darunavir (PREZISTA) 800 MG tablet Take 1 tablet (800 mg total) by mouth daily.  Marland Kitchen elvitegravir-cobicistat-emtricitabine-tenofovir (GENVOYA) 150-150-200-10 MG TABS tablet Take 1 tablet by mouth daily with breakfast.  . acetaminophen (TYLENOL) 325 MG tablet Take 2 tablets (650 mg total) by mouth every 4 (four) hours as needed (for pain scale < 4). (Patient not taking: Reported on 07/26/2018)  . fexofenadine (ALLEGRA) 180 MG tablet Take 180 mg by mouth daily.  . folic acid (FOLVITE) 1 MG tablet Take 1 tablet (1 mg total) by mouth daily.  Marland Kitchen ibuprofen (ADVIL,MOTRIN) 200 MG tablet Take 200 mg by mouth every 6 (six) hours as needed for mild pain.  Marland Kitchen ondansetron (ZOFRAN ODT) 8 MG disintegrating tablet Take 1 tablet (8 mg total) by mouth every 8 (eight) hours as needed for nausea or vomiting. (Patient not taking: Reported on 07/26/2018)  . oxyCODONE-acetaminophen (PERCOCET/ROXICET) 5-325 MG tablet Take 1-2 tablets by mouth every 6 (six) hours as needed for severe pain. [place] instructions (Patient not taking: Reported on 07/26/2018)  . Prenatal Multivit-Min-Fe-FA (PRENATAL VITAMINS) 0.8 MG tablet Take 1 tablet by mouth daily.  . [DISCONTINUED] norgestimate-ethinyl estradiol (ORTHO-CYCLEN,SPRINTEC,PREVIFEM) 0.25-35 MG-MCG tablet Take 1 tablet by mouth daily. (Patient not taking: Reported on 07/26/2018)  .  [DISCONTINUED] norgestimate-ethinyl estradiol (ORTHO-CYCLEN,SPRINTEC,PREVIFEM) 0.25-35 MG-MCG tablet Take 1 tablet by mouth daily.   No facility-administered encounter medications on file as of 07/26/2018.      Patient Active Problem List   Diagnosis Date Noted  . S/P cesarean section 06/22/2016  . ASCUS with positive high risk HPV 03/12/2016  . Abdominal wall hernia 03/12/2016  . HIV disease (HCC) 03/12/2016     Health Maintenance Due  Topic Date Due  . TETANUS/TDAP  08/15/1999     Review of Systems  Physical Exam   BP 98/67   Pulse 74   Temp 98 F (36.7 C) (Oral)   Wt 200 lb (90.7 kg)   LMP 06/14/2018   BMI 29.53 kg/m    Lab Results  Component Value Date   CD4TCELL 39 04/21/2018   Lab Results  Component Value Date   CD4TABS 1,570 04/21/2018   CD4TABS 1,220 12/08/2017   CD4TABS 620 05/22/2016   Lab Results  Component Value Date   HIV1RNAQUANT <20 DETECTED (A) 04/21/2018   Lab Results  Component Value Date   HEPBSAB NEG 03/04/2016   Lab Results  Component Value Date   LABRPR NON-REACTIVE 12/08/2017    CBC Lab Results  Component Value Date   WBC 8.3 04/21/2018   RBC 4.37 04/21/2018   HGB 11.9 04/21/2018   HCT 35.9 04/21/2018   PLT 329 04/21/2018   MCV 82.2 04/21/2018   MCH 27.2 04/21/2018   MCHC 33.1 04/21/2018   RDW 12.9 04/21/2018   LYMPHSABS 3,843 04/21/2018   MONOABS 546 03/04/2016   EOSABS 183 04/21/2018    BMET Lab  Results  Component Value Date   NA 137 04/21/2018   K 3.9 04/21/2018   CL 103 04/21/2018   CO2 28 04/21/2018   GLUCOSE 97 04/21/2018   BUN 8 04/21/2018   CREATININE 0.73 04/21/2018   CALCIUM 9.2 04/21/2018   GFRNONAA 116 12/08/2017   GFRAA 135 12/08/2017      Assessment and Plan  - check pregnancy test--> urine pregnancy is positive, so we are doing blood test for confirmation  Will not be able to do oral birth control.    will start mulitvitamin and folic acid  Has hx of 3 csection. High risk pregnancy -  has hx of 2nd child having trisomy 69   hiv disease = for now will continue on current regimen -------------------  Addendum - blood hcg levels looks like early pregnancy in 1-2 wk   I have called patient to let her know results. She is still thinking about what she will do wether to continue with pregnancy, vs termination

## 2018-07-27 LAB — HCG, QUANTITATIVE, PREGNANCY: HCG, Total, QN: 401 m[IU]/mL

## 2018-08-18 ENCOUNTER — Encounter: Payer: Self-pay | Admitting: Internal Medicine

## 2018-08-19 ENCOUNTER — Telehealth: Payer: Self-pay

## 2018-08-19 DIAGNOSIS — O0991 Supervision of high risk pregnancy, unspecified, first trimester: Secondary | ICD-10-CM

## 2018-08-19 NOTE — Telephone Encounter (Deleted)
Patient walked in to clinic

## 2018-08-19 NOTE — Telephone Encounter (Addendum)
Patient walked into office with her husband with complaint of  Fatigue and dizziness  She has recently been diagnosed pregnant.  She says she did not experience these symptoms with last pregnancy.  She states dizziness occurs when she goes from sitting to standing.    Patient was advised these are normal symptoms of pregnancy , move slowly when going from sitting to standing and stay hydrated.  Advised to call for appointment if symptoms continue.  Patient has not received a call for High Risk OB at [facility] so referral was submitted today.  She was also advised to go to [facility] to [employer].   Laurell Josephs, RN

## 2018-10-07 ENCOUNTER — Other Ambulatory Visit: Payer: Self-pay

## 2018-10-07 ENCOUNTER — Encounter (HOSPITAL_COMMUNITY): Payer: Self-pay | Admitting: Emergency Medicine

## 2018-10-07 ENCOUNTER — Emergency Department (HOSPITAL_COMMUNITY)
Admission: EM | Admit: 2018-10-07 | Discharge: 2018-10-07 | Disposition: A | Discharge status: Home / Self Care | Payer: Self-pay | Attending: Emergency Medicine | Admitting: Emergency Medicine

## 2018-10-07 DIAGNOSIS — Z3A12 12 weeks gestation of pregnancy: Secondary | ICD-10-CM | POA: Insufficient documentation

## 2018-10-07 DIAGNOSIS — R059 Cough, unspecified: Secondary | ICD-10-CM

## 2018-10-07 DIAGNOSIS — Z79899 Other long term (current) drug therapy: Secondary | ICD-10-CM | POA: Insufficient documentation

## 2018-10-07 DIAGNOSIS — O99511 Diseases of the respiratory system complicating pregnancy, first trimester: Secondary | ICD-10-CM | POA: Insufficient documentation

## 2018-10-07 DIAGNOSIS — O98711 Human immunodeficiency virus [HIV] disease complicating pregnancy, first trimester: Secondary | ICD-10-CM | POA: Insufficient documentation

## 2018-10-07 DIAGNOSIS — Z21 Asymptomatic human immunodeficiency virus [HIV] infection status: Secondary | ICD-10-CM | POA: Insufficient documentation

## 2018-10-07 DIAGNOSIS — R05 Cough: Secondary | ICD-10-CM | POA: Insufficient documentation

## 2018-10-07 NOTE — Discharge Instructions (Addendum)
You may take Tylenol to help with your symptoms.  You may also try tea, honey to help coat your throat.  Please follow-up with your OB/GYN for any pregnancy related complaints.

## 2018-10-07 NOTE — ED Provider Notes (Signed)
[facility] EMERGENCY DEPARTMENT Provider Note   CSN: [id number] Arrival date & time: 10/07/18  1511    History   Chief Complaint No chief complaint on file.   HPI Terri Padilla is a 38 y.o. female.     38 y.o female currently 3 months pregnant with a  PMH of HIV presents to the ED with a chief complaint of cough x3 days.  Patient reports a dry cough, she has tried over-the-counter spray to her throat but only reports mild symptoms.  She also reports no exacerbating or alleviating factors.  Patient denies any shortness of breath, chest pain, fever, vaginal discharge or urinary complaints.     Past Medical History:  Diagnosis Date   HIV (human immunodeficiency virus infection) (HCC)     Patient Active Problem List   Diagnosis Date Noted   S/P cesarean section 06/22/2016   ASCUS with positive high risk HPV 03/12/2016   Abdominal wall hernia 03/12/2016   HIV disease (HCC) 03/12/2016    Past Surgical History:  Procedure Laterality Date   CESAREAN SECTION     CESAREAN SECTION N/A 06/22/2016   Procedure: CESAREAN SECTION;  Surgeon: Lazaro Arms, MD;  Location: [facility] BIRTHING SUITES;  Service: Obstetrics;  Laterality: N/A;     OB History    Gravida  6   Para  4   Term  4   Preterm  0   AB  1   Living  4     SAB  1   TAB  0   Ectopic  0   Multiple  0   Live Births  4            Home Medications    Prior to Admission medications   Medication Sig Start Date End Date Taking? Authorizing Provider  acetaminophen (TYLENOL) 325 MG tablet Take 2 tablets (650 mg total) by mouth every 4 (four) hours as needed (for pain scale < 4). Patient not taking: Reported on 07/26/2018 06/25/16   Lazaro Arms, MD  darunavir (PREZISTA) 800 MG tablet Take 1 tablet (800 mg total) by mouth daily with breakfast. 12/08/17   Blanchard Kelch, NP  darunavir (PREZISTA) 800 MG tablet Take 1 tablet (800 mg total) by mouth daily. 01/13/18    Judyann Munson, MD  elvitegravir-cobicistat-emtricitabine-tenofovir (GENVOYA) 150-150-200-10 MG TABS tablet Take 1 tablet by mouth daily with breakfast. 01/13/18   Judyann Munson, MD  fexofenadine (ALLEGRA) 180 MG tablet Take 180 mg by mouth daily.    [provider]  folic acid (FOLVITE) 1 MG tablet Take 1 tablet (1 mg total) by mouth daily. 07/26/18   Judyann Munson, MD  ibuprofen (ADVIL,MOTRIN) 200 MG tablet Take 200 mg by mouth every 6 (six) hours as needed for mild pain.    [provider]  ondansetron (ZOFRAN ODT) 8 MG disintegrating tablet Take 1 tablet (8 mg total) by mouth every 8 (eight) hours as needed for nausea or vomiting. Patient not taking: Reported on 07/26/2018 08/19/16   Judyann Munson, MD  oxyCODONE-acetaminophen (PERCOCET/ROXICET) 5-325 MG tablet Take 1-2 tablets by mouth every 6 (six) hours as needed for severe pain. [place] instructions Patient not taking: Reported on 07/26/2018 06/25/16   Aviva Signs, CNM  Prenatal Multivit-Min-Fe-FA (PRENATAL VITAMINS) 0.8 MG tablet Take 1 tablet by mouth daily. 07/26/18   Judyann Munson, MD    Family History Family History  Problem Relation Age of Onset   Arthritis Mother    Down syndrome Daughter  Asthma Neg Hx    Alcohol abuse Neg Hx    Birth defects Neg Hx    Cancer Neg Hx    COPD Neg Hx    Depression Neg Hx    Diabetes Neg Hx    Drug abuse Neg Hx    Early death Neg Hx    Hearing loss Neg Hx    Heart disease Neg Hx    Hyperlipidemia Neg Hx    Hypertension Neg Hx    Kidney disease Neg Hx    Learning disabilities Neg Hx    Mental illness Neg Hx    Mental retardation Neg Hx    Miscarriages / Stillbirths Neg Hx    Stroke Neg Hx    Vision loss Neg Hx    Varicose Veins Neg Hx     Social History Social History   Tobacco Use   Smoking status: Never Smoker   Smokeless tobacco: Never Used  Substance Use Topics   Alcohol use: No   Drug use: No     Allergies     Chloroquine   Review of Systems Review of Systems  Constitutional: Negative for chills and fever.  HENT: Negative for ear pain and sore throat.   Eyes: Negative for pain and visual disturbance.  Respiratory: Positive for cough. Negative for shortness of breath.   Cardiovascular: Negative for chest pain and palpitations.  Gastrointestinal: Negative for abdominal pain and vomiting.  Genitourinary: Negative for dysuria and hematuria.  Musculoskeletal: Negative for arthralgias and back pain.  Skin: Negative for color change and rash.  Neurological: Negative for seizures and syncope.  All other systems reviewed and are negative.    Physical Exam Updated Vital Signs BP 115/80 (BP Location: Right Arm)    Pulse 88    Temp 98.8 F (37.1 C) (Oral)    Resp 16    LMP 07/08/2018 (Approximate)    SpO2 100%   Physical Exam Vitals signs and nursing note reviewed.  Constitutional:      General: She is not in acute distress.    Appearance: She is well-developed.  HENT:     Head: Normocephalic and atraumatic.     Comments: Oropharynx appears erythematous, no swelling or tonsillar abscess appreciated.    Mouth/Throat:     Pharynx: No oropharyngeal exudate.  Eyes:     Pupils: Pupils are equal, round, and reactive to light.  Neck:     Musculoskeletal: Normal range of motion.  Cardiovascular:     Rate and Rhythm: Regular rhythm.     Heart sounds: Normal heart sounds.  Pulmonary:     Effort: Pulmonary effort is normal. No respiratory distress.     Breath sounds: Normal breath sounds.     Comments: Lungs are clear to auscultation. Abdominal:     General: Bowel sounds are normal. There is no distension.     Palpations: Abdomen is soft.     Tenderness: There is no abdominal tenderness.  Musculoskeletal:        General: No tenderness or deformity.     Right lower leg: No edema.     Left lower leg: No edema.  Skin:    General: Skin is warm and dry.  Neurological:     Mental Status: She  is alert and oriented to person, place, and time.      ED Treatments / Results  Labs (all labs ordered are listed, but only abnormal results are displayed) Labs Reviewed - No data to display  EKG None  Radiology No results found.  Procedures Procedures (including critical care time)  Medications Ordered in ED Medications - No data to display   Initial Impression / Assessment and Plan / ED Course  I have reviewed the triage vital signs and the nursing notes.  Pertinent labs & imaging results that were available during my care of the patient were reviewed by me and considered in my medical decision making (see chart for details).       Patient currently 3 months pregnant with a previous history of HIV presents to the ED with complaints of a dry cough x3 days.  She has tried spray to her throat but reports no alleviating symptoms.  She is currently 3 months pregnant, denies any vaginal discharge, urinary symptoms, abdominal pain, fever.  During evaluation patient's vitals are stable, she is afebrile, no tachycardia.  No coughing episodes during encounter.  During evaluation patient requesting COVID-19 testing at this time, she has been advised that we are no longer testing in the emergency department and only on admission basis.  Lungs are clear to auscultation no wheezing rhonchi or rales. Patient is instructed to follow-up with primary care as needed.  Due to pregnancy she is unable to take most over-the-counter medication but can take Tylenol.  Low suspicion for any pneumonia, lungs are clear to auscultation, afebrile during visit.  No coughing episodes during visit.  Low suspicion for influenza, patient is very well-appearing, has no symptoms of distress.  Stable for discharge.   Final Clinical Impressions(s) / ED Diagnoses   Final diagnoses:  Cough    ED Discharge Orders    None       Claude Manges, PA-C 10/07/18 1549    Cathren Laine, MD 10/07/18 [phone]

## 2018-10-07 NOTE — ED Triage Notes (Signed)
Pt reports cough, h/a and sore throat x 3 days, denies any fevers.  Pt is 3 months pregnant.

## 2018-10-15 ENCOUNTER — Encounter: Payer: Self-pay | Admitting: Obstetrics and Gynecology

## 2018-10-15 ENCOUNTER — Other Ambulatory Visit: Payer: Self-pay

## 2018-10-27 ENCOUNTER — Telehealth: Payer: Self-pay | Admitting: Obstetrics & Gynecology

## 2018-10-27 NOTE — Telephone Encounter (Signed)
Called the patient to inform of new location and face to face appointment. Received the message " Terri Padilla sorry you call can not be completed at this time. Please hang up and try your call again.

## 2018-10-28 ENCOUNTER — Encounter: Payer: Self-pay | Admitting: Obstetrics and Gynecology

## 2018-10-28 NOTE — Progress Notes (Signed)
Patient was no show for her NOB visit, she will be called to reschedule.

## 2018-10-29 ENCOUNTER — Encounter (HOSPITAL_COMMUNITY): Payer: Self-pay | Admitting: *Deleted

## 2018-10-29 ENCOUNTER — Inpatient Hospital Stay (HOSPITAL_COMMUNITY)
Admission: AD | Admit: 2018-10-29 | Discharge: 2018-10-29 | Disposition: A | Discharge status: Home / Self Care | Payer: Self-pay | Attending: Obstetrics and Gynecology | Admitting: Obstetrics and Gynecology

## 2018-10-29 ENCOUNTER — Other Ambulatory Visit: Payer: Self-pay

## 2018-10-29 DIAGNOSIS — Z883 Allergy status to other anti-infective agents status: Secondary | ICD-10-CM | POA: Insufficient documentation

## 2018-10-29 DIAGNOSIS — R102 Pelvic and perineal pain: Secondary | ICD-10-CM | POA: Insufficient documentation

## 2018-10-29 DIAGNOSIS — O26892 Other specified pregnancy related conditions, second trimester: Secondary | ICD-10-CM

## 2018-10-29 DIAGNOSIS — O98712 Human immunodeficiency virus [HIV] disease complicating pregnancy, second trimester: Secondary | ICD-10-CM | POA: Insufficient documentation

## 2018-10-29 DIAGNOSIS — B379 Candidiasis, unspecified: Secondary | ICD-10-CM | POA: Insufficient documentation

## 2018-10-29 DIAGNOSIS — B20 Human immunodeficiency virus [HIV] disease: Secondary | ICD-10-CM | POA: Insufficient documentation

## 2018-10-29 DIAGNOSIS — Z3A16 16 weeks gestation of pregnancy: Secondary | ICD-10-CM | POA: Insufficient documentation

## 2018-10-29 DIAGNOSIS — N949 Unspecified condition associated with female genital organs and menstrual cycle: Secondary | ICD-10-CM

## 2018-10-29 DIAGNOSIS — O34219 Maternal care for unspecified type scar from previous cesarean delivery: Secondary | ICD-10-CM | POA: Insufficient documentation

## 2018-10-29 DIAGNOSIS — O98812 Other maternal infectious and parasitic diseases complicating pregnancy, second trimester: Secondary | ICD-10-CM | POA: Insufficient documentation

## 2018-10-29 DIAGNOSIS — O9989 Other specified diseases and conditions complicating pregnancy, childbirth and the puerperium: Secondary | ICD-10-CM | POA: Insufficient documentation

## 2018-10-29 DIAGNOSIS — O0932 Supervision of pregnancy with insufficient antenatal care, second trimester: Secondary | ICD-10-CM | POA: Insufficient documentation

## 2018-10-29 LAB — URINALYSIS, ROUTINE W REFLEX MICROSCOPIC
Bilirubin Urine: NEGATIVE
Glucose, UA: 50 mg/dL — AB
Hgb urine dipstick: NEGATIVE
Ketones, ur: NEGATIVE mg/dL
Nitrite: NEGATIVE
Protein, ur: NEGATIVE mg/dL
Specific Gravity, Urine: 1.017 (ref 1.005–1.030)
pH: 5 (ref 5.0–8.0)

## 2018-10-29 LAB — WET PREP, GENITAL
Clue Cells Wet Prep HPF POC: NONE SEEN
Sperm: NONE SEEN
Trich, Wet Prep: NONE SEEN

## 2018-10-29 MED ORDER — IBUPROFEN 600 MG PO TABS
600.0000 mg | ORAL_TABLET | Freq: Once | ORAL | Status: AC
  Administered 2018-10-29: 600 mg via ORAL
  Filled 2018-10-29: qty 1

## 2018-10-29 MED ORDER — TERCONAZOLE 0.4 % VA CREA: 1.0000 | TOPICAL_CREAM | Freq: Every day | VAGINAL | 0 refills | Status: DC

## 2018-10-29 NOTE — MAU Note (Signed)
Contractions coming about every . ? Water leaking, started last night. No bleeding.

## 2018-10-29 NOTE — MAU Provider Note (Signed)
History     CSN: [id number]  Arrival date and time: 10/29/18 0847   First Provider Initiated Contact with Patient 10/29/18 0945      Chief Complaint  Patient presents with  . Contractions   HPI   [name] is  38 y.o. female with a history of HIV [id number] @ [redacted]w[redacted]d here in MAU with contractions that started 2-3 days ago. The pain feels like contractions. The pain comes and goes. The pain worsens when she is up walking around.  No bleeding. Some leaking of discharge that started 2 days ago. The discharge is watery; this only happened 1 time. No history of preterm births.   OB History    Gravida  6   Para  4   Term  4   Preterm  0   AB  1   Living  4     SAB  1   TAB  0   Ectopic  0   Multiple  0   Live Births  4           Past Medical History:  Diagnosis Date  . HIV (human immunodeficiency virus infection) (HCC)     Past Surgical History:  Procedure Laterality Date  . CESAREAN SECTION     C/S x 4  . CESAREAN SECTION N/A 06/22/2016   Procedure: CESAREAN SECTION;  Surgeon: Lazaro Arms, MD;  Location: [facility] BIRTHING SUITES;  Service: Obstetrics;  Laterality: N/A;    Family History  Problem Relation Age of Onset  . Arthritis Mother   . Down syndrome Daughter   . Asthma Neg Hx   . Alcohol abuse Neg Hx   . Birth defects Neg Hx   . Cancer Neg Hx   . COPD Neg Hx   . Depression Neg Hx   . Diabetes Neg Hx   . Drug abuse Neg Hx   . Early death Neg Hx   . Hearing loss Neg Hx   . Heart disease Neg Hx   . Hyperlipidemia Neg Hx   . Hypertension Neg Hx   . Kidney disease Neg Hx   . Learning disabilities Neg Hx   . Mental illness Neg Hx   . Mental retardation Neg Hx   . Miscarriages / Stillbirths Neg Hx   . Stroke Neg Hx   . Vision loss Neg Hx   . Varicose Veins Neg Hx     Social History   Tobacco Use  . Smoking status: Never Smoker  . Smokeless tobacco: Never Used  Substance Use Topics  . Alcohol use: No  . Drug use: No     Allergies:  Allergies  Allergen Reactions  . Chloroquine Itching    Per Interpreter: Nivaquine (Chloroquine)    Medications Prior to Admission  Medication Sig Dispense Refill Last Dose  . acetaminophen (TYLENOL) 325 MG tablet Take 2 tablets (650 mg total) by mouth every 4 (four) hours as needed (for pain scale < 4). 30 tablet 0 Past Month at Unknown time  . darunavir (PREZISTA) 800 MG tablet Take 1 tablet (800 mg total) by mouth daily with breakfast. 30 tablet 2 10/28/2018 at Unknown time  . elvitegravir-cobicistat-emtricitabine-tenofovir (GENVOYA) 150-150-200-10 MG TABS tablet Take 1 tablet by mouth daily with breakfast. 30 tablet 11 10/28/2018 at Unknown time  . folic acid (FOLVITE) 1 MG tablet Take 1 tablet (1 mg total) by mouth daily. 100 tablet 1 10/28/2018 at Unknown time  . darunavir (PREZISTA) 800 MG tablet Take 1 tablet (800 mg  total) by mouth daily. 60 tablet 11 Taking  . fexofenadine (ALLEGRA) 180 MG tablet Take 180 mg by mouth daily.   Not Taking  . ibuprofen (ADVIL,MOTRIN) 200 MG tablet Take 200 mg by mouth every 6 (six) hours as needed for mild pain.   Not Taking  . ondansetron (ZOFRAN ODT) 8 MG disintegrating tablet Take 1 tablet (8 mg total) by mouth every 8 (eight) hours as needed for nausea or vomiting. (Patient not taking: Reported on 07/26/2018) 20 tablet 0 Not Taking  . oxyCODONE-acetaminophen (PERCOCET/ROXICET) 5-325 MG tablet Take 1-2 tablets by mouth every 6 (six) hours as needed for severe pain. [place] instructions (Patient not taking: Reported on 07/26/2018) 30 tablet 0 Not Taking  . Prenatal Multivit-Min-Fe-FA (PRENATAL VITAMINS) 0.8 MG tablet Take 1 tablet by mouth daily. 100 tablet 2    Results for orders placed or performed during the hospital encounter of 10/29/18 (from the past 48 hour(s))  Urinalysis, Routine w reflex microscopic     Status: Abnormal   Collection Time: 10/29/18  9:43 AM  Result Value Ref Range   Color, Urine YELLOW YELLOW   APPearance CLEAR  CLEAR   Specific Gravity, Urine 1.017 1.005 - 1.030   pH 5.0 5.0 - 8.0   Glucose, UA 50 (A) NEGATIVE mg/dL   Hgb urine dipstick NEGATIVE NEGATIVE   Bilirubin Urine NEGATIVE NEGATIVE   Ketones, ur NEGATIVE NEGATIVE mg/dL   Protein, ur NEGATIVE NEGATIVE mg/dL   Nitrite NEGATIVE NEGATIVE   Leukocytes,Ua SMALL (A) NEGATIVE   RBC / HPF 0-5 0 - 5 RBC/hpf   WBC, UA 6-10 0 - 5 WBC/hpf   Bacteria, UA FEW (A) NONE SEEN   Squamous Epithelial / LPF 0-5 0 - 5   Mucus PRESENT     Comment: Performed at [facility], [address], [place], [place] [address]  Wet prep, genital     Status: Abnormal   Collection Time: 10/29/18 10:25 AM  Result Value Ref Range   Yeast Wet Prep HPF POC PRESENT (A) NONE SEEN   Trich, Wet Prep NONE SEEN NONE SEEN   Clue Cells Wet Prep HPF POC NONE SEEN NONE SEEN   WBC, Wet Prep HPF POC MANY (A) NONE SEEN    Comment: MANY BACTERIA SEEN   Sperm NONE SEEN     Comment: Performed at [facility], [address], [place], [place] [address]   Review of Systems  Constitutional: Positive for fever.  Gastrointestinal: Positive for abdominal pain.  Genitourinary: Positive for vaginal discharge. Negative for dysuria and vaginal bleeding.   Physical Exam   Blood pressure 109/63, pulse 83, temperature 98.6 F (37 C), temperature source Oral, resp. rate 18, weight 90.4 kg, last menstrual period 07/08/2018.  Physical Exam  Constitutional: She is oriented to person, place, and time. She appears well-nourished. No distress.  GI: Soft. She exhibits no distension. There is no abdominal tenderness. There is no rebound.  Genitourinary:    Genitourinary Comments: Vagina - Small- Moderate amount of thick white vaginal discharge, no odor  Cervix - No contact bleeding, no active bleeding. No pooling of fluid.  Bimanual exam: Cervix closed, thick, long Uterus non tender, normal size GC/Chlam, wet prep done Chaperone present for exam.    Musculoskeletal: Normal range of  motion.  Neurological: She is alert and oriented to person, place, and time.  Skin: Skin is warm. She is not diaphoretic.  Psychiatric: Her behavior is normal.   MAU Course  Procedures  None  MDM  + fetal heart tones via doppler UA Wet prep and GC Wet prep shows yeast Ibuprofen given 600 mg PO, pain down to 0/10  Assessment and Plan   A:  1. Yeast infection   2. [redacted] weeks gestation of pregnancy   3. Round ligament pain   4. No prenatal care in current pregnancy in second trimester     P:  Discharge home in stable condition Rx: Terazol Recommend pregnancy support belt Importance of starting prenatal care. Call [facility] Prenatal vitamins daily Return to MAU if symptoms worsen.    Venia Carbonasch, Neyland Pettengill I, NP 10/29/2018 2:08 PM

## 2018-10-29 NOTE — Discharge Instructions (Signed)
How a Baby Grows During Pregnancy    Pregnancy begins when a female's sperm enters a female's egg (fertilization). Fertilization usually happens in one of the tubes (fallopian tubes) that connect the ovaries to the womb (uterus). The fertilized egg moves down the fallopian tube to the uterus. Once it reaches the uterus, it implants into the lining of the uterus and begins to grow.  For the first 10 weeks, the fertilized egg is called an embryo. After 10 weeks, it is called a fetus. As the fetus continues to grow, it receives oxygen and nutrients through tissue (placenta) that grows to support the developing baby. The placenta is the life support system for the baby. It provides oxygen and nutrition and removes waste.  Learning as much as you can about your pregnancy and how your baby is developing can help you enjoy the experience. It can also make you aware of when there might be a problem and when to ask questions.  How long does a typical pregnancy last?  A pregnancy usually lasts 280 days, or about 40 weeks. Pregnancy is divided into three periods of growth, also called trimesters:   First trimester: 0-12 weeks.   Second trimester: 13-27 weeks.   Third trimester: 28-40 weeks.  The day when your baby is ready to be born (full term) is your estimated date of delivery.  How does my baby develop month by month?  First month   The fertilized egg attaches to the inside of the uterus.   Some cells will form the placenta. Others will form the fetus.   The arms, legs, brain, spinal cord, lungs, and heart begin to develop.   At the end of the first month, the heart begins to beat.  Second month   The bones, inner ear, eyelids, hands, and feet form.   The genitals develop.   By the end of 8 weeks, all major organs are developing.  Third month   All of the internal organs are forming.   Teeth develop below the gums.   Bones and muscles begin to grow. The spine can flex.   The skin is transparent.   Fingernails  and toenails begin to form.   Arms and legs continue to grow longer, and hands and feet develop.   The fetus is about 3 inches (7.6 cm) long.  Fourth month   The placenta is completely formed.   The external sex organs, neck, outer ear, eyebrows, eyelids, and fingernails are formed.   The fetus can hear, swallow, and move its arms and legs.   The kidneys begin to produce urine.   The skin is covered with a white, waxy coating (vernix) and very fine hair (lanugo).  Fifth month   The fetus moves around more and can be felt for the first time (quickening).   The fetus starts to sleep and wake up and may begin to suck its finger.   The nails grow to the end of the fingers.   The organ in the digestive system that makes bile (gallbladder) functions and helps to digest nutrients.   If your baby is a girl, eggs are present in her ovaries. If your baby is a boy, testicles start to move down into his scrotum.  Sixth month   The lungs are formed.   The eyes open. The brain continues to develop.   Your baby has fingerprints and toe prints. Your baby's hair grows thicker.   At the end of the second trimester, the   fetus is about 9 inches (22.9 cm) long.  Seventh month   The fetus kicks and stretches.   The eyes are developed enough to sense changes in light.   The hands can make a grasping motion.   The fetus responds to sound.  Eighth month   All organs and body systems are fully developed and functioning.   Bones harden, and taste buds develop. The fetus may hiccup.   Certain areas of the brain are still developing. The skull remains soft.  Ninth month   The fetus gains about  lb (0.23 kg) each week.   The lungs are fully developed.   Patterns of sleep develop.   The fetus's head typically moves into a head-down position (vertex) in the uterus to prepare for birth.   The fetus weighs 6-9 lb (2.72-4.08 kg) and is 19-20 inches (48.26-50.8 cm) long.  What can I do to have a healthy pregnancy and help  my baby develop?  General instructions   Take prenatal vitamins as directed by your health care provider. These include vitamins such as folic acid, iron, calcium, and vitamin D. They are important for healthy development.   Take medicines only as directed by your health care provider. Read labels and ask a pharmacist or your health care provider whether over-the-counter medicines, supplements, and prescription drugs are safe to take during pregnancy.   Keep all follow-up visits as directed by your health care provider. This is important. Follow-up visits include prenatal care and screening tests.  How do I know if my baby is developing well?  At each prenatal visit, your health care provider will do several different tests to check on your health and keep track of your baby's development. These include:   Fundal height and position.  ? Your health care provider will measure your growing belly from your pubic bone to the top of the uterus using a tape measure.  ? Your health care provider will also feel your belly to determine your baby's position.   Heartbeat.  ? An ultrasound in the first trimester can confirm pregnancy and show a heartbeat, depending on how far along you are.  ? Your health care provider will check your baby's heart rate at every prenatal visit.   Second trimester ultrasound.  ? This ultrasound checks your baby's development. It also may show your baby's gender.  What should I do if I have concerns about my baby's development?  Always talk with your health care provider about any concerns that you may have about your pregnancy and your baby.  Summary   A pregnancy usually lasts 280 days, or about 40 weeks. Pregnancy is divided into three periods of growth, also called trimesters.   Your health care provider will monitor your baby's growth and development throughout your pregnancy.   Follow your health care provider's recommendations about taking prenatal vitamins and medicines during  your pregnancy.   Talk with your health care provider if you have any concerns about your pregnancy or your developing baby.  This information is not intended to replace advice given to you by your health care provider. Make sure you discuss any questions you have with your health care provider.  Document Released: 12/17/2007 Document Revised: 05/13/2017 Document Reviewed: 05/13/2017  Elsevier Interactive Patient Education  2019 [employer]vier [employer]

## 2018-11-01 LAB — GC/CHLAMYDIA PROBE AMP (~~LOC~~) NOT AT ARMC
Chlamydia: NEGATIVE
Neisseria Gonorrhea: NEGATIVE

## 2018-11-05 ENCOUNTER — Telehealth: Payer: Self-pay | Admitting: Obstetrics and Gynecology

## 2018-11-05 NOTE — Telephone Encounter (Signed)
The patient is scheduled for a face to face visit. Moved to Dr. Vergie Living schedule per Mrs. Antionette.

## 2018-11-10 ENCOUNTER — Other Ambulatory Visit: Payer: Self-pay

## 2018-11-10 ENCOUNTER — Ambulatory Visit (INDEPENDENT_AMBULATORY_CARE_PROVIDER_SITE_OTHER): Payer: Self-pay | Admitting: Obstetrics and Gynecology

## 2018-11-10 ENCOUNTER — Encounter: Payer: Self-pay | Admitting: Obstetrics and Gynecology

## 2018-11-10 VITALS — BP 108/70 | HR 85 | Temp 98.3°F | Wt 196.9 lb

## 2018-11-10 DIAGNOSIS — O0990 Supervision of high risk pregnancy, unspecified, unspecified trimester: Secondary | ICD-10-CM | POA: Insufficient documentation

## 2018-11-10 DIAGNOSIS — O09299 Supervision of pregnancy with other poor reproductive or obstetric history, unspecified trimester: Secondary | ICD-10-CM

## 2018-11-10 DIAGNOSIS — Z3A17 17 weeks gestation of pregnancy: Secondary | ICD-10-CM

## 2018-11-10 DIAGNOSIS — N879 Dysplasia of cervix uteri, unspecified: Secondary | ICD-10-CM

## 2018-11-10 DIAGNOSIS — Z124 Encounter for screening for malignant neoplasm of cervix: Secondary | ICD-10-CM

## 2018-11-10 DIAGNOSIS — O09292 Supervision of pregnancy with other poor reproductive or obstetric history, second trimester: Secondary | ICD-10-CM

## 2018-11-10 DIAGNOSIS — O09522 Supervision of elderly multigravida, second trimester: Secondary | ICD-10-CM

## 2018-11-10 DIAGNOSIS — N90812 Female genital mutilation Type II status: Secondary | ICD-10-CM | POA: Insufficient documentation

## 2018-11-10 DIAGNOSIS — Z1151 Encounter for screening for human papillomavirus (HPV): Secondary | ICD-10-CM

## 2018-11-10 DIAGNOSIS — Z789 Other specified health status: Secondary | ICD-10-CM | POA: Insufficient documentation

## 2018-11-10 DIAGNOSIS — O0992 Supervision of high risk pregnancy, unspecified, second trimester: Secondary | ICD-10-CM

## 2018-11-10 DIAGNOSIS — O2342 Unspecified infection of urinary tract in pregnancy, second trimester: Secondary | ICD-10-CM

## 2018-11-10 MED ORDER — ASPIRIN EC 81 MG PO TBEC: 81.0000 mg | DELAYED_RELEASE_TABLET | Freq: Every day | ORAL | 4 refills | Status: DC

## 2018-11-10 NOTE — Progress Notes (Signed)
New OB Note  11/10/2018   [facility]  Chief Complaint: NOB  Transfer of Care Patient: no  History of Present Illness: [name] is a 38 y.o. [id number] @ 17/6 weeks (EDC tentative at Oct 1, based on Patient's last menstrual period was 07/08/2018 (approximate).).  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   Any events prior to today's visit: no Her periods were: qmonth, regular She was using no method when she conceived.  No fetal quickening She has Negative signs or symptoms of miscarriage or preterm labor  ROS: A 12-point review of systems was performed and negative, except as stated in the above HPI.  OBGYN History: As per HPI. OB History  Gravida Para Term Preterm AB Living  6 4 4  0 1 4  SAB TAB Ectopic Multiple Live Births  1 0 0 0 4    # Outcome Date GA Lbr Len/2nd Weight Sex Delivery Anes PTL Lv  6 Current           5 Term 06/22/16 4792w1d  6 lb 4.5 oz (2.85 kg) M CS-LTranv Spinal  LIV  4 Term 04/05/15 6943w2d  6 lb 9.8 oz (3 kg) F CS-Unspec        Birth Comments: had a problem with abdomen during pregnancy, baby born with undiagnosed down's syndrome  3 Term 04/15/13 7219w0d  6 lb 6.3 oz (2.9 kg) F CS-Unspec        Birth Comments: no complications  2 Term 10/21/08 7869w0d  7 lb 7.9 oz (3.4 kg) M CS-Unspec        Birth Comments: no complications except c/s for ftp, and bleeding, trisomy 21  1 SAB 2009            Any issues with any prior pregnancies: see problem list History of pap smears: Yes. Last pap smear 2017 and results were ascus/hpv pos   Past Medical History: Past Medical  History:  Diagnosis Date  . HIV (human immunodeficiency virus infection) (HCC)     Past Surgical History: Past Surgical History:  Procedure Laterality Date  . CESAREAN SECTION     C/S x 4  . CESAREAN SECTION N/A 06/22/2016   Procedure: CESAREAN SECTION;  Surgeon: Lazaro ArmsLuther H Eure, MD;  [facility];  Service: Obstetrics;  Laterality: N/A;    Family History:  Family History  Problem Relation Age of Onset  . Arthritis Mother   . Down syndrome Daughter   . Asthma Neg Hx   . Alcohol abuse Neg Hx   . Birth defects Neg Hx   . Cancer Neg Hx   . COPD Neg Hx   . Depression Neg Hx   . Diabetes Neg Hx   . Drug abuse Neg Hx   . Early death Neg Hx   . Hearing loss Neg Hx   . Heart disease Neg Hx   . Hyperlipidemia Neg Hx   . Hypertension Neg Hx   . Kidney disease Neg Hx   . Learning disabilities Neg Hx   . Mental illness Neg Hx   . Mental retardation Neg Hx   . Miscarriages / Stillbirths Neg Hx   . Stroke Neg Hx   . Vision  loss Neg Hx   . Varicose Veins Neg Hx     Social History:  Social History   Socioeconomic History  . Marital status: Married    Spouse name: Not on file  . Number of children: Not on file  . Years of education: Not on file  . Highest education level: Not on file  Occupational History  . Not on file  Social Needs  . Financial resource strain: Not on file  . Food insecurity:    Worry: Not on file    Inability: Not on file  . Transportation needs:    Medical: Not on file    Non-medical: Not on file  Tobacco Use  . Smoking status: Never Smoker  . Smokeless tobacco: Never Used  Substance and Sexual Activity  . Alcohol use: No  . Drug use: No  . Sexual activity: Yes  Lifestyle  . Physical activity:    Days per week: Not on file    Minutes per session: Not on file  . Stress: Not on file  Relationships  . Social connections:    Talks on phone: Not on file    Gets together: Not on file    Attends religious service: Not on file     Active member of club or organization: Not on file    Attends meetings of clubs or organizations: Not on file    Relationship status: Not on file  . Intimate partner violence:    Fear of current or ex partner: Not on file    Emotionally abused: Not on file    Physically abused: Not on file    Forced sexual activity: Not on file  Other Topics Concern  . Not on file  Social History Narrative  . Not on file    Allergy: Allergies  Allergen Reactions  . Chloroquine Itching    Per Interpreter: Nivaquine (Chloroquine)    Current Outpatient Medications: Prezista, Genvoya, folic acid, prenatal vitamin  Physical Exam:   BP 108/70   Pulse 85   Temp 98.3 F (36.8 C)   Wt 196 lb 14.4 oz (89.3 kg)   LMP 07/08/2018 (Approximate)   BMI 29.08 kg/m  Body mass index is 29.08 kg/m. Contractions: Not present Vag. Bleeding: None. Fundal height: 18 FHTs: 140s  General appearance: Well nourished, well developed female in no acute distress.  Neck:  Supple, normal appearance, and no thyromegaly  Cardiovascular: S1, S2 normal, no murmur, rub or gallop, regular rate and rhythm Respiratory:  Clear to auscultation bilateral. Normal respiratory effort Abdomen: 2cm umbilical hernia, nttp. Gravid, fundus nttp Breasts: breasts appear normal, no suspicious masses, no skin or nipple changes or axillary nodes, and normal palpation. Neuro/Psych:  Normal mood and affect.  Skin:  Warm and dry.  Lymphatic:  No inguinal lymphadenopathy.   Pelvic exam: is limited by body habitus EGBUS: within normal limits with type 2 female circumcision Vaginal vault: normal with no blood or d/c Unable to see cervix Cervix palpates closed, long and high and points posteriorly.  Laboratory: Reviewed. Last VL in late 2019 was undetectable  Imaging:  None.  Assessment: pt stable  Plan: 1. Supervision of high risk pregnancy in second trimester Routine care. Patient amenable to starting low dose asa. D/w her re:  BTL later in pregnancy - [place] MFM OB DETAIL +14 WK; Future - Cytology - PAP( [place]) - Hemoglobin A1c - CMP and Liver - TSH - Protein / creatinine ratio, urine - AMB MFM GENETICS REFERRAL  2.  Language barrier Declines interpreter  3. Multigravida of advanced maternal age in second trimester Will set up patient to see genetics same day after anatomy, dating u/s in 1-2 weeks - Hemoglobin A1c - CMP and Liver - TSH - Protein / creatinine ratio, urine - AMB MFM GENETICS REFERRAL  4. 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 Will need another pap postpartum  5. Down syndrome in child of prior pregnancy, currently pregnant See above.  - AMB MFM GENETICS REFERRAL  6. History of c-section  7. HIV Will CC Dr. Drue Second on today's note  Problem list reviewed and updated.  Follow up in 3 weeks.  >50% of 25 min visit spent on counseling and coordination of care.     Cornelia Copa MD Attending [facility])

## 2018-11-11 LAB — OBSTETRIC PANEL, INCLUDING HIV
Antibody Screen: NEGATIVE
Basophils Absolute: 0 10*3/uL (ref 0.0–0.2)
Basos: 0 %
EOS (ABSOLUTE): 0.1 10*3/uL (ref 0.0–0.4)
Eos: 1 %
HIV Screen 4th Generation wRfx: REACTIVE — AB
Hematocrit: 32.6 % — ABNORMAL LOW (ref 34.0–46.6)
Hemoglobin: 10.7 g/dL — ABNORMAL LOW (ref 11.1–15.9)
Hepatitis B Surface Ag: NEGATIVE
Immature Grans (Abs): 0 10*3/uL (ref 0.0–0.1)
Immature Granulocytes: 0 %
Lymphocytes Absolute: 3.6 10*3/uL — ABNORMAL HIGH (ref 0.7–3.1)
Lymphs: 30 %
MCH: 27.5 pg (ref 26.6–33.0)
MCHC: 32.8 g/dL (ref 31.5–35.7)
MCV: 84 fL (ref 79–97)
Monocytes Absolute: 0.7 10*3/uL (ref 0.1–0.9)
Monocytes: 6 %
Neutrophils Absolute: 7.5 10*3/uL — ABNORMAL HIGH (ref 1.4–7.0)
Neutrophils: 63 %
Platelets: 273 10*3/uL (ref 150–450)
RBC: 3.89 x10E6/uL (ref 3.77–5.28)
RDW: 13.6 % (ref 11.7–15.4)
RPR Ser Ql: NONREACTIVE
Rh Factor: POSITIVE
Rubella Antibodies, IGG: 5.93 index (ref >0.99)
WBC: 11.9 10*3/uL — ABNORMAL HIGH (ref 3.4–10.8)

## 2018-11-11 LAB — HIV 1/2 AB DIFFERENTIATION
HIV 1 Ab: POSITIVE — AB
HIV 2 Ab: NEGATIVE
NOTE (HIV CONF MULTIP: POSITIVE

## 2018-11-11 LAB — CMP AND LIVER
ALT: 13 IU/L (ref 0–32)
AST: 13 IU/L (ref 0–40)
Albumin: 3.5 g/dL — ABNORMAL LOW (ref 3.8–4.8)
Alkaline Phosphatase: 47 IU/L (ref 39–117)
BUN: 4 mg/dL — ABNORMAL LOW (ref 6–20)
Bilirubin Total: <0.2 mg/dL (ref 0.0–1.2)
Bilirubin, Direct: 0.07 mg/dL (ref 0.00–0.40)
CO2: 20 mmol/L (ref 20–29)
Calcium: 8.7 mg/dL (ref 8.7–10.2)
Chloride: 104 mmol/L (ref 96–106)
Creatinine, Ser: 0.49 mg/dL — ABNORMAL LOW (ref 0.57–1.00)
GFR calc Af Amer: 143 mL/min/{1.73_m2} (ref >59)
GFR calc non Af Amer: 124 mL/min/{1.73_m2} (ref >59)
Glucose: 83 mg/dL (ref 65–99)
Potassium: 4.3 mmol/L (ref 3.5–5.2)
Sodium: 138 mmol/L (ref 134–144)
Total Protein: 6 g/dL (ref 6.0–8.5)

## 2018-11-11 LAB — PROTEIN / CREATININE RATIO, URINE
Creatinine, Urine: 116 mg/dL
Protein, Ur: 15.8 mg/dL
Protein/Creat Ratio: 136 mg/g creat (ref 0–200)

## 2018-11-11 LAB — TSH: TSH: 2.14 u[IU]/mL (ref 0.450–4.500)

## 2018-11-11 LAB — HEMOGLOBIN A1C
Est. average glucose Bld gHb Est-mCnc: 108 mg/dL
Hgb A1c MFr Bld: 5.4 % (ref 4.8–5.6)

## 2018-11-12 LAB — CYTOLOGY - PAP
Diagnosis: NEGATIVE
HPV: NOT DETECTED

## 2018-11-13 LAB — CULTURE, OB URINE

## 2018-11-13 LAB — URINE CULTURE, OB REFLEX

## 2018-11-15 DIAGNOSIS — O2340 Unspecified infection of urinary tract in pregnancy, unspecified trimester: Secondary | ICD-10-CM | POA: Insufficient documentation

## 2018-11-15 MED ORDER — AMOXICILLIN 500 MG PO CAPS: 500.0000 mg | ORAL_CAPSULE | Freq: Three times a day (TID) | ORAL | 0 refills | Status: AC

## 2018-11-15 NOTE — Addendum Note (Signed)
Addended by: [place] Bing on: 11/15/2018 09:35 AM   Modules accepted: Orders

## 2018-11-24 ENCOUNTER — Encounter: Payer: Self-pay | Admitting: Obstetrics and Gynecology

## 2018-11-24 ENCOUNTER — Other Ambulatory Visit: Payer: Self-pay

## 2018-11-24 ENCOUNTER — Ambulatory Visit (INDEPENDENT_AMBULATORY_CARE_PROVIDER_SITE_OTHER): Payer: Self-pay | Admitting: Pharmacist

## 2018-11-24 ENCOUNTER — Ambulatory Visit (HOSPITAL_COMMUNITY): Payer: Self-pay | Admitting: Obstetrics and Gynecology

## 2018-11-24 ENCOUNTER — Ambulatory Visit (HOSPITAL_COMMUNITY)
Admission: RE | Admit: 2018-11-24 | Discharge: 2018-11-24 | Disposition: A | Discharge status: Home / Self Care | Payer: Self-pay | Source: Ambulatory Visit | Attending: Obstetrics and Gynecology | Admitting: Obstetrics and Gynecology

## 2018-11-24 ENCOUNTER — Ambulatory Visit (HOSPITAL_COMMUNITY): Payer: Self-pay | Admitting: *Deleted

## 2018-11-24 ENCOUNTER — Encounter (HOSPITAL_COMMUNITY): Payer: Self-pay

## 2018-11-24 ENCOUNTER — Other Ambulatory Visit (HOSPITAL_COMMUNITY): Payer: Self-pay | Admitting: *Deleted

## 2018-11-24 ENCOUNTER — Ambulatory Visit (HOSPITAL_COMMUNITY): Payer: Self-pay

## 2018-11-24 VITALS — BP 98/58 | HR 73 | Temp 99.0°F

## 2018-11-24 DIAGNOSIS — O98712 Human immunodeficiency virus [HIV] disease complicating pregnancy, second trimester: Secondary | ICD-10-CM

## 2018-11-24 DIAGNOSIS — O0992 Supervision of high risk pregnancy, unspecified, second trimester: Secondary | ICD-10-CM | POA: Insufficient documentation

## 2018-11-24 DIAGNOSIS — O34219 Maternal care for unspecified type scar from previous cesarean delivery: Secondary | ICD-10-CM

## 2018-11-24 DIAGNOSIS — O09523 Supervision of elderly multigravida, third trimester: Secondary | ICD-10-CM

## 2018-11-24 DIAGNOSIS — O09522 Supervision of elderly multigravida, second trimester: Secondary | ICD-10-CM

## 2018-11-24 DIAGNOSIS — Z3687 Encounter for antenatal screening for uncertain dates: Secondary | ICD-10-CM

## 2018-11-24 DIAGNOSIS — Z362 Encounter for other antenatal screening follow-up: Secondary | ICD-10-CM

## 2018-11-24 DIAGNOSIS — Z363 Encounter for antenatal screening for malformations: Secondary | ICD-10-CM

## 2018-11-24 DIAGNOSIS — O0991 Supervision of high risk pregnancy, unspecified, first trimester: Secondary | ICD-10-CM

## 2018-11-24 DIAGNOSIS — B20 Human immunodeficiency virus [HIV] disease: Secondary | ICD-10-CM

## 2018-11-24 DIAGNOSIS — Z3A21 21 weeks gestation of pregnancy: Secondary | ICD-10-CM

## 2018-11-24 MED ORDER — EMTRICITABINE-TENOFOVIR DF 200-300 MG PO TABS: 1.0000 | ORAL_TABLET | Freq: Every day | ORAL | 11 refills | Status: DC

## 2018-11-24 MED ORDER — RITONAVIR 100 MG PO TABS: 100.0000 mg | ORAL_TABLET | Freq: Every day | ORAL | 11 refills | Status: DC

## 2018-11-24 MED ORDER — DARUNAVIR ETHANOLATE 800 MG PO TABS: 800.0000 mg | ORAL_TABLET | Freq: Every day | ORAL | 11 refills | Status: DC

## 2018-11-24 NOTE — Progress Notes (Signed)
HPI: Terri Padilla is a 38 y.o. female who presents to the [facility] clinic today as a walk-in to ask about her medications.  Patient Active Problem List   Diagnosis Date Noted  . UTI in pregnancy 11/15/2018  . Supervision of high risk pregnancy in second trimester 11/10/2018  . Language barrier 11/10/2018  . 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 Umbilical hernia 68/05/5725  . HIV disease ([place]) 03/12/2016  . AMA (advanced maternal age) multigravida 35+ 02/21/2016  . Previous cesarean delivery affecting pregnancy, antepartum 02/21/2016  . Down syndrome in child of prior pregnancy, currently pregnant 02/21/2016    Patient's Medications  New Prescriptions   DARUNAVIR (PREZISTA) 800 MG TABLET    Take 1 tablet (800 mg total) by mouth daily with breakfast.   EMTRICITABINE-TENOFOVIR (TRUVADA) 200-300 MG TABLET    Take 1 tablet by mouth daily.   RITONAVIR (NORVIR) 100 MG TABS TABLET    Take 1 tablet (100 mg total) by mouth daily with breakfast.  Previous Medications   ACETAMINOPHEN (TYLENOL) 325 MG TABLET    Take 2 tablets (650 mg total) by mouth every 4 (four) hours as needed (for pain scale < 4).   ASPIRIN EC 81 MG TABLET    Take 1 tablet (81 mg total) by mouth daily.   FEXOFENADINE (ALLEGRA) 180 MG TABLET    Take 180 mg by mouth daily.   FOLIC ACID (FOLVITE) 1 MG TABLET    Take 1 tablet (1 mg total) by mouth daily.   ONDANSETRON (ZOFRAN ODT) 8 MG DISINTEGRATING TABLET    Take 1 tablet (8 mg total) by mouth every 8 (eight) hours as needed for nausea or vomiting.   PRENATAL MULTIVIT-MIN-FE-FA (PRENATAL VITAMINS) 0.8 MG TABLET    Take 1 tablet by mouth daily.   TERCONAZOLE (TERAZOL 7) 0.4 % VAGINAL CREAM    Place 1 applicator vaginally at bedtime.  Modified Medications   No medications on file  Discontinued Medications   DARUNAVIR (PREZISTA) 800 MG TABLET    Take 1 tablet (800 mg total) by mouth daily with breakfast.   DARUNAVIR (PREZISTA)  800 MG TABLET    Take 1 tablet (800 mg total) by mouth daily.   ELVITEGRAVIR-COBICISTAT-EMTRICITABINE-TENOFOVIR (GENVOYA) 150-150-200-10 MG TABS TABLET    Take 1 tablet by mouth daily with breakfast.    Allergies: Allergies  Allergen Reactions  . Chloroquine Itching    Per Interpreter: Nivaquine (Chloroquine)    Past Medical History: Past Medical History:  Diagnosis Date  . HIV (human immunodeficiency virus infection) ([place])     Social History: Social History   Socioeconomic History  . Marital status: Married    Spouse name: Not on file  . Number of children: Not on file  . Years of education: Not on file  . Highest education level: Not on file  Occupational History  . Not on file  Social Needs  . Financial resource strain: Not on file  . Food insecurity:    Worry: Not on file    Inability: Not on file  . Transportation needs:    Medical: Not on file    Non-medical: Not on file  Tobacco Use  . Smoking status: Never Smoker  . Smokeless tobacco: Never Used  Substance and Sexual Activity  . Alcohol use: No  . Drug use: No  . Sexual activity: Yes  Lifestyle  . Physical activity:    Days per week: Not on file    Minutes per  session: Not on file  . Stress: Not on file  Relationships  . Social connections:    Talks on phone: Not on file    Gets together: Not on file    Attends religious service: Not on file    Active member of club or organization: Not on file    Attends meetings of clubs or organizations: Not on file    Relationship status: Not on file  Other Topics Concern  . Not on file  Social History Narrative  . Not on file    Labs: Lab Results  Component Value Date   HIV1RNAQUANT <20 DETECTED (A) 04/21/2018   HIV1RNAQUANT <20 DETECTED (A) 12/08/2017   HIV1RNAQUANT 104 (H) 07/10/2016   CD4TABS 1,570 04/21/2018   CD4TABS 1,220 12/08/2017   CD4TABS 620 05/22/2016    RPR and STI Lab Results  Component Value Date   LABRPR Non Reactive 11/10/2018    LABRPR NON-REACTIVE 12/08/2017   LABRPR Non Reactive 06/22/2016   LABRPR NON REAC 05/28/2016   LABRPR NON REAC 03/04/2016    STI Results GC CT  10/29/2018 Negative Negative  12/08/2017 Negative Negative  05/28/2016 Negative Negative  03/04/2016 Negative Negative  02/25/2016 Negative Negative  02/21/2016 Negative Negative    Hepatitis B Lab Results  Component Value Date   HEPBSAB NEG 03/04/2016   HEPBSAG Negative 11/10/2018   HEPBCAB NON REACTIVE 03/04/2016   Hepatitis C No results found for: HEPCAB, HCVRNAPCRQN Hepatitis A Lab Results  Component Value Date   HAV REACTIVE (A) 03/04/2016   Lipids: Lab Results  Component Value Date   CHOL 183 12/08/2017   TRIG 63 12/08/2017   HDL 47 (L) 12/08/2017   CHOLHDL 3.9 12/08/2017   VLDL 39 (H) 03/04/2016   LDLCALC 121 (H) 12/08/2017    Current HIV Regimen: Genvoya + Prezista  Assessment: Terri Padilla is here today as a walk in to ask someone about her medications. Her original HMAP was set to expire on 10/12/2018 but due to the [place] 19 pandemic, she is approved until the end of May.  She has already completed her application for renewal until September 2020.    She is pregnant again and 21 weeks and 5 days into her pregnancy.  She is currently prescribed Genvoya + Prezista.  Genvoya should not be taken during pregnancy, so I met with her today to change her medications.  She does tell me today that she has missed 2 days of her ART due to running out and the pharmacy telling her that she cannot get refills.  She has been on multiple regimens in the past for her pregnancy, but I will switch her to Prezista + Norvir + Truvada. Technically, Prezista and Norvir should be given twice daily during pregnancy due to the change in volume distribution, but after having a long discussion with her, she refuses to take anything twice daily.  It is acceptable to give once daily if adherence issues would hinder patient taking it correctly.    I spent  time going over all three medications with her.  Encouraged her to never take one without taking all three of them and to try and not miss doses.  She has taken all three in the past and remembers taking them. She will take all three together with breakfast every morning.  I sent them to [facility] and she will pick them up today.  I will have her come back and see Dr. Baxter Flattery in 4-6 weeks.  She knows to call or stop  by the clinic if she has any issues at [facility].  Plan: - Stop Genvoya and Prezista - Start Prezista 800 mg + Norvir 100 mg + Truvada - all three pills by mouth once daily with food - F/u with Dr. Baxter Flattery 6/15 at 930am  Melysa Schroyer L. Elantra Caprara, PharmD, BCIDP, AAHIVP, [place] [facility] 11/25/2018, 1:24 PM

## 2018-11-25 ENCOUNTER — Encounter: Payer: Self-pay | Admitting: Internal Medicine

## 2018-12-01 ENCOUNTER — Encounter: Payer: Self-pay | Admitting: Obstetrics & Gynecology

## 2018-12-15 ENCOUNTER — Ambulatory Visit (INDEPENDENT_AMBULATORY_CARE_PROVIDER_SITE_OTHER): Payer: Self-pay | Admitting: Obstetrics & Gynecology

## 2018-12-15 ENCOUNTER — Other Ambulatory Visit: Payer: Self-pay

## 2018-12-15 DIAGNOSIS — B20 Human immunodeficiency virus [HIV] disease: Secondary | ICD-10-CM

## 2018-12-15 DIAGNOSIS — O99210 Obesity complicating pregnancy, unspecified trimester: Secondary | ICD-10-CM

## 2018-12-15 DIAGNOSIS — O0992 Supervision of high risk pregnancy, unspecified, second trimester: Secondary | ICD-10-CM

## 2018-12-15 DIAGNOSIS — O09522 Supervision of elderly multigravida, second trimester: Secondary | ICD-10-CM

## 2018-12-15 DIAGNOSIS — O98712 Human immunodeficiency virus [HIV] disease complicating pregnancy, second trimester: Secondary | ICD-10-CM

## 2018-12-15 DIAGNOSIS — O99212 Obesity complicating pregnancy, second trimester: Secondary | ICD-10-CM

## 2018-12-15 DIAGNOSIS — Z3A24 24 weeks gestation of pregnancy: Secondary | ICD-10-CM

## 2018-12-15 NOTE — Progress Notes (Signed)
   PRENATAL VISIT NOTE  Subjective:  Terri Padilla is a 38 y.o. [id number] at [redacted]w[redacted]d being seen today for ongoing prenatal care.  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  Patient reports no complaints.  Contractions: Not present. Vag. Bleeding: None.  Movement: Present. Denies leaking of fluid.   The following portions of the patient's history were reviewed and updated as appropriate: allergies, current medications, past family history, past medical history, past social history, past surgical history and problem list.   Objective:  There were no vitals filed for this visit.  Fetal Status:     Movement: Present                              Assessment and Plan:  Pregnancy: [id number] at [redacted]w[redacted]d 1. Supervision of high risk pregnancy in second trimester - 2 hour GTT at next visit  2. HIV disease (HCC) - sees ID and has an appt in the next month  3. Multigravida of advanced maternal age in second trimester  4. History of 4 previous cesarean sections- repeat scheduled for 9/18  Preterm labor symptoms and general obstetric precautions including but not limited to vaginal bleeding, contractions, leaking of fluid and fetal movement were reviewed in detail with the patient. Please refer to After Visit Summary for other counseling recommendations.   No follow-ups on file.  Future Appointments  Date Time Provider Department Center  12/15/2018 10:15 AM Allie Bossier, MD [facility]  12/23/2018  8:00 AM [facility] NURSE [facility]  12/23/2018  8:00 AM [facility] [place] 3  [facility]  12/27/2018  9:30 AM Judyann Munson, MD [facility]    Allie Bossier, MD

## 2018-12-15 NOTE — Progress Notes (Signed)
1004::Interpreter #[id number] used to call pt regarding her appointment. Pt did not pick up and per the interpreter, the phone rang for awhile before the message, "the wireless customer you are trying to reach at this time is not available. Please try again later" was received.  Will attempt again in 15 minutes.   1009::Pt declines interpreter. I connected with  Terri Padilla on 12/15/18 at 10:15 AM EDT by telephone and verified that I am speaking with the correct person using two identifiers.   I discussed the limitations, risks, security and privacy concerns of performing an evaluation and management service by telephone and the availability of in person appointments. I also discussed with the patient that there may be a patient responsible charge related to this service. The patient expressed understanding and agreed to proceed.  Osvaldo Human, RN 12/15/2018  10:10 AM   Pt does not have access to BP cuff.  Will need to be given one at next appointment.

## 2018-12-23 ENCOUNTER — Other Ambulatory Visit (HOSPITAL_COMMUNITY): Payer: Self-pay | Admitting: *Deleted

## 2018-12-23 ENCOUNTER — Ambulatory Visit (HOSPITAL_COMMUNITY): Payer: Self-pay | Admitting: *Deleted

## 2018-12-23 ENCOUNTER — Encounter (HOSPITAL_COMMUNITY): Payer: Self-pay

## 2018-12-23 ENCOUNTER — Ambulatory Visit (HOSPITAL_COMMUNITY)
Admission: RE | Admit: 2018-12-23 | Discharge: 2018-12-23 | Disposition: A | Discharge status: Home / Self Care | Payer: Self-pay | Source: Ambulatory Visit | Attending: Obstetrics and Gynecology | Admitting: Obstetrics and Gynecology

## 2018-12-23 ENCOUNTER — Other Ambulatory Visit: Payer: Self-pay

## 2018-12-23 VITALS — BP 113/77 | HR 84 | Temp 98.7°F

## 2018-12-23 DIAGNOSIS — O0990 Supervision of high risk pregnancy, unspecified, unspecified trimester: Secondary | ICD-10-CM | POA: Insufficient documentation

## 2018-12-23 DIAGNOSIS — O34219 Maternal care for unspecified type scar from previous cesarean delivery: Secondary | ICD-10-CM

## 2018-12-23 DIAGNOSIS — Z8489 Family history of other specified conditions: Secondary | ICD-10-CM

## 2018-12-23 DIAGNOSIS — Z362 Encounter for other antenatal screening follow-up: Secondary | ICD-10-CM | POA: Insufficient documentation

## 2018-12-23 DIAGNOSIS — O09523 Supervision of elderly multigravida, third trimester: Secondary | ICD-10-CM

## 2018-12-23 DIAGNOSIS — Z3A25 25 weeks gestation of pregnancy: Secondary | ICD-10-CM

## 2018-12-23 DIAGNOSIS — O98712 Human immunodeficiency virus [HIV] disease complicating pregnancy, second trimester: Secondary | ICD-10-CM

## 2018-12-23 DIAGNOSIS — O09522 Supervision of elderly multigravida, second trimester: Secondary | ICD-10-CM

## 2018-12-23 DIAGNOSIS — Z3687 Encounter for antenatal screening for uncertain dates: Secondary | ICD-10-CM

## 2018-12-27 ENCOUNTER — Encounter: Payer: Self-pay | Admitting: Internal Medicine

## 2018-12-27 ENCOUNTER — Telehealth: Payer: Self-pay | Admitting: Pharmacy Technician

## 2018-12-27 ENCOUNTER — Telehealth: Payer: Self-pay

## 2018-12-27 ENCOUNTER — Ambulatory Visit (INDEPENDENT_AMBULATORY_CARE_PROVIDER_SITE_OTHER): Payer: Self-pay | Admitting: Internal Medicine

## 2018-12-27 ENCOUNTER — Other Ambulatory Visit: Payer: Self-pay

## 2018-12-27 VITALS — BP 107/70 | HR 97 | Temp 98.4°F | Wt 203.0 lb

## 2018-12-27 DIAGNOSIS — B20 Human immunodeficiency virus [HIV] disease: Secondary | ICD-10-CM

## 2018-12-27 DIAGNOSIS — O0991 Supervision of high risk pregnancy, unspecified, first trimester: Secondary | ICD-10-CM

## 2018-12-27 MED ORDER — EMTRICITABINE-TENOFOVIR DF 200-300 MG PO TABS: 1.0000 | ORAL_TABLET | Freq: Every day | ORAL | 2 refills | Status: DC

## 2018-12-27 MED ORDER — DARUNAVIR ETHANOLATE 800 MG PO TABS: 800.0000 mg | ORAL_TABLET | Freq: Every day | ORAL | 2 refills | Status: DC

## 2018-12-27 MED ORDER — RITONAVIR 100 MG PO TABS: 100.0000 mg | ORAL_TABLET | Freq: Every day | ORAL | 2 refills | Status: DC

## 2018-12-27 NOTE — Progress Notes (Signed)
Patient ID: Terri Padilla, female   DOB: 02/13/1981, 38 y.o.   MRN: [id number]  HPI Terri Padilla is a 38 y.o. [id number] at 24w  With well controlled hiv disease. No morning sickness but fatigue  - has appointment for oral glucose challenge  No missing doses of hiv medication  Outpatient Encounter Medications as of 12/27/2018  Medication Sig  . aspirin EC 81 MG tablet Take 1 tablet (81 mg total) by mouth daily.  . folic acid (FOLVITE) 1 MG tablet Take 1 tablet (1 mg total) by mouth daily.  . Prenatal Multivit-Min-Fe-FA (PRENATAL VITAMINS) 0.8 MG tablet Take 1 tablet by mouth daily.  . [DISCONTINUED] darunavir (PREZISTA) 800 MG tablet Take 1 tablet (800 mg total) by mouth daily with breakfast.  . [DISCONTINUED] emtricitabine-tenofovir (TRUVADA) 200-300 MG tablet Take 1 tablet by mouth daily.  . [DISCONTINUED] ritonavir (NORVIR) 100 MG TABS tablet Take 1 tablet (100 mg total) by mouth daily with breakfast.  . acetaminophen (TYLENOL) 325 MG tablet Take 2 tablets (650 mg total) by mouth every 4 (four) hours as needed (for pain scale < 4). (Patient not taking: Reported on 12/27/2018)   No facility-administered encounter medications on file as of 12/27/2018.      Patient Active Problem List   Diagnosis Date Noted  . Obesity in pregnancy 12/15/2018  . UTI in pregnancy 11/15/2018  . Supervision of high risk pregnancy in second trimester 11/10/2018  . Language barrier 11/10/2018  . 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 Umbilical hernia 03/12/2016  . HIV disease (HCC) 03/12/2016  . AMA (advanced maternal age) multigravida 35+ 02/21/2016  . Previous cesarean delivery affecting pregnancy, antepartum 02/21/2016  . Down syndrome in child of prior pregnancy, currently pregnant 02/21/2016     Health Maintenance Due  Topic Date Due  . TETANUS/TDAP  08/15/1999     Review of Systems -no morning sickness Constitutional: Negative  for fever, chills, diaphoresis, activity change, appetite change, fatigue and unexpected weight change.  HENT: Negative for congestion, sore throat, rhinorrhea, sneezing, trouble swallowing and sinus pressure.  Eyes: Negative for photophobia and visual disturbance.  Respiratory: Negative for cough, chest tightness, shortness of breath, wheezing and stridor.  Cardiovascular: Negative for chest pain, palpitations and leg swelling.  Gastrointestinal: Negative for nausea, vomiting, abdominal pain, diarrhea, constipation, blood in stool, abdominal distention and anal bleeding.  Genitourinary: Negative for dysuria, hematuria, flank pain and difficulty urinating.  Musculoskeletal: Negative for myalgias, back pain, joint swelling, arthralgias and gait problem.  Skin: Negative for color change, pallor, rash and wound.  Neurological: Negative for dizziness, tremors, weakness and light-headedness.  Hematological: Negative for adenopathy. Does not bruise/bleed easily.  Psychiatric/Behavioral: Negative for behavioral problems, confusion, sleep disturbance, dysphoric mood, decreased concentration and agitation.    Physical Exam   BP 107/70   Pulse 97   Temp 98.4 F (36.9 C) (Oral)   Wt 203 lb (92.1 kg)   LMP 07/08/2018 (Approximate)   BMI 29.98 kg/m   Physical Exam  Constitutional:  oriented to person, place, and time. appears well-developed and well-nourished. No distress.  HENT: [place]/AT, PERRLA, no scleral icterus Mouth/Throat: Oropharynx is clear and moist. No oropharyngeal exudate.  Cardiovascular: Normal rate, regular rhythm and normal heart sounds. Exam reveals no gallop and no friction rub.  No murmur heard.  Pulmonary/Chest: Effort normal and breath sounds normal. No respiratory distress.  has no wheezes.  Neck = supple, no nuchal rigidity Abdominal: Soft. Bowel sounds are normal.  exhibits no distension. There is no tenderness.  Lymphadenopathy: no cervical adenopathy. No axillary  adenopathy Neurological: alert and oriented to person, place, and time.  Skin: Skin is warm and dry. No rash noted. No erythema.  Psychiatric: a normal mood and affect.  behavior is normal.   Lab Results  Component Value Date   CD4TCELL 39 04/21/2018   Lab Results  Component Value Date   CD4TABS 1,570 04/21/2018   CD4TABS 1,220 12/08/2017   CD4TABS 620 05/22/2016   Lab Results  Component Value Date   HIV1RNAQUANT <20 DETECTED (A) 04/21/2018   Lab Results  Component Value Date   HEPBSAB NEG 03/04/2016   Lab Results  Component Value Date   LABRPR Non Reactive 11/10/2018    CBC Lab Results  Component Value Date   WBC 11.9 (H) 11/10/2018   RBC 3.89 11/10/2018   HGB 10.7 (L) 11/10/2018   HCT 32.6 (L) 11/10/2018   PLT 273 11/10/2018   MCV 84 11/10/2018   MCH 27.5 11/10/2018   MCHC 32.8 11/10/2018   RDW 13.6 11/10/2018   LYMPHSABS 3.6 (H) 11/10/2018   MONOABS 546 03/04/2016   EOSABS 0.1 11/10/2018    BMET Lab Results  Component Value Date   NA 138 11/10/2018   K 4.3 11/10/2018   CL 104 11/10/2018   CO2 20 11/10/2018   GLUCOSE 83 11/10/2018   BUN 4 (L) 11/10/2018   CREATININE 0.49 (L) 11/10/2018   CALCIUM 8.7 11/10/2018   GFRNONAA 124 11/10/2018   GFRAA 143 11/10/2018      Assessment and Plan  hiv disease = continue on truvada-prezista-ritonavir  High risk pregnancy = ? Unclear if having medicaid pregnancy coverage. Have given her resources and if any issues see what we can do about getting access to medications  After this IUD for 5 years

## 2018-12-27 NOTE — Telephone Encounter (Signed)
[facility] Patient Advocate Encounter  Spoke with [employer] and the pharmacist explained that the medication was being held by insurance rejection and was already filled by the Special[facility] location in [place] on June 10th to be shipped via [employer] to a local [facility], [address] [place] [address]. The husband authorized for the medication to be sent to this location. I spoke with the patient to update her on the location for her medication and their phone number. She voiced understanding and was going to pick up the medication today.  [facility]- [place] [phone] (sent 12/22/18) [place] [phone] (signed received 12/23/2018)

## 2018-12-27 NOTE — Telephone Encounter (Signed)
Called pharmacy per Dr. Baxter Flattery to have patient's Prezista, Norvir, and Truvada refilled. Spoke with Katharine Look, Pharmacy Tech who states patient medication would be an out of pocket expense since meds were filled on 6/10. Patient denies picking any prescriptions this month and states it was last filled in May.  Katharine Look, Pharmacy tech states medication was picked up on 6/10 at 10: 12 am. Pharmacy states they will need to contact [employer] to override cost for meds at this time. Pharmacy will call patient with updates. Will follow- up with pharmacy later today Aundria Rud, [place] .

## 2018-12-28 ENCOUNTER — Other Ambulatory Visit: Payer: Self-pay | Admitting: *Deleted

## 2018-12-28 DIAGNOSIS — O0992 Supervision of high risk pregnancy, unspecified, second trimester: Secondary | ICD-10-CM

## 2018-12-28 LAB — T-HELPER CELL (CD4) - (RCID CLINIC ONLY)
CD4 % Helper T Cell: 44 % (ref 33–65)
CD4 T Cell Abs: 1525 /uL (ref 400–1790)

## 2018-12-29 ENCOUNTER — Other Ambulatory Visit: Payer: Self-pay

## 2018-12-29 ENCOUNTER — Encounter: Payer: Self-pay | Admitting: Family Medicine

## 2018-12-29 ENCOUNTER — Encounter: Payer: Self-pay | Admitting: Obstetrics and Gynecology

## 2018-12-29 ENCOUNTER — Telehealth: Payer: Self-pay | Admitting: Family Medicine

## 2018-12-29 NOTE — Telephone Encounter (Signed)
Attempted to call patient with the help of an interpreter [phone]. Patient phone was not working. Not able to leave a VM.

## 2019-01-01 LAB — CBC WITH DIFFERENTIAL/PLATELET
Absolute Monocytes: 1022 cells/uL — ABNORMAL HIGH (ref 200–950)
Basophils Absolute: 43 cells/uL (ref 0–200)
Basophils Relative: 0.3 %
Eosinophils Absolute: 114 cells/uL (ref 15–500)
Eosinophils Relative: 0.8 %
HCT: 30.4 % — ABNORMAL LOW (ref 35.0–45.0)
Hemoglobin: 10.3 g/dL — ABNORMAL LOW (ref 11.7–15.5)
Lymphs Abs: 3706 cells/uL (ref 850–3900)
MCH: 28.1 pg (ref 27.0–33.0)
MCHC: 33.9 g/dL (ref 32.0–36.0)
MCV: 83.1 fL (ref 80.0–100.0)
MPV: 12 fL (ref 7.5–12.5)
Monocytes Relative: 7.2 %
Neutro Abs: 9315 cells/uL — ABNORMAL HIGH (ref 1500–7800)
Neutrophils Relative %: 65.6 %
Platelets: 267 10*3/uL (ref 140–400)
RBC: 3.66 10*6/uL — ABNORMAL LOW (ref 3.80–5.10)
RDW: 13.7 % (ref 11.0–15.0)
Total Lymphocyte: 26.1 %
WBC: 14.2 10*3/uL — ABNORMAL HIGH (ref 3.8–10.8)

## 2019-01-01 LAB — COMPLETE METABOLIC PANEL WITH GFR
AG Ratio: 1.2 (calc) (ref 1.0–2.5)
ALT: 12 U/L (ref 6–29)
AST: 13 U/L (ref 10–30)
Albumin: 3.5 g/dL — ABNORMAL LOW (ref 3.6–5.1)
Alkaline phosphatase (APISO): 51 U/L (ref 31–125)
BUN/Creatinine Ratio: 8 (calc) (ref 6–22)
BUN: 4 mg/dL — ABNORMAL LOW (ref 7–25)
CO2: 23 mmol/L (ref 20–32)
Calcium: 9.2 mg/dL (ref 8.6–10.2)
Chloride: 104 mmol/L (ref 98–110)
Creat: 0.53 mg/dL (ref 0.50–1.10)
GFR, Est African American: 140 mL/min/{1.73_m2} (ref >=60)
GFR, Est Non African American: 120 mL/min/{1.73_m2} (ref >=60)
Globulin: 2.9 g/dL (calc) (ref 1.9–3.7)
Glucose, Bld: 84 mg/dL (ref 65–99)
Potassium: 4 mmol/L (ref 3.5–5.3)
Sodium: 136 mmol/L (ref 135–146)
Total Bilirubin: 0.3 mg/dL (ref 0.2–1.2)
Total Protein: 6.4 g/dL (ref 6.1–8.1)

## 2019-01-01 LAB — RPR: RPR Ser Ql: NONREACTIVE

## 2019-01-01 LAB — HIV-1 RNA QUANT-NO REFLEX-BLD
HIV 1 RNA Quant: 80 copies/mL — ABNORMAL HIGH
HIV-1 RNA Quant, Log: 1.9 Log copies/mL — ABNORMAL HIGH

## 2019-01-05 ENCOUNTER — Telehealth: Payer: Self-pay | Admitting: Family Medicine

## 2019-01-05 NOTE — Telephone Encounter (Signed)
Attempted to reach patient. No answer.

## 2019-01-06 ENCOUNTER — Other Ambulatory Visit: Payer: Self-pay

## 2019-01-06 ENCOUNTER — Other Ambulatory Visit: Payer: Self-pay | Admitting: General Practice

## 2019-01-06 DIAGNOSIS — O0992 Supervision of high risk pregnancy, unspecified, second trimester: Secondary | ICD-10-CM

## 2019-01-07 ENCOUNTER — Ambulatory Visit (INDEPENDENT_AMBULATORY_CARE_PROVIDER_SITE_OTHER): Payer: Self-pay | Admitting: Family Medicine

## 2019-01-07 VITALS — BP 106/72 | HR 101 | Temp 98.4°F | Wt 200.6 lb

## 2019-01-07 DIAGNOSIS — Z3A28 28 weeks gestation of pregnancy: Secondary | ICD-10-CM

## 2019-01-07 DIAGNOSIS — O09523 Supervision of elderly multigravida, third trimester: Secondary | ICD-10-CM

## 2019-01-07 DIAGNOSIS — O34219 Maternal care for unspecified type scar from previous cesarean delivery: Secondary | ICD-10-CM

## 2019-01-07 DIAGNOSIS — K641 Second degree hemorrhoids: Secondary | ICD-10-CM

## 2019-01-07 DIAGNOSIS — O98719 Human immunodeficiency virus [HIV] disease complicating pregnancy, unspecified trimester: Secondary | ICD-10-CM

## 2019-01-07 DIAGNOSIS — O99013 Anemia complicating pregnancy, third trimester: Secondary | ICD-10-CM

## 2019-01-07 DIAGNOSIS — O0993 Supervision of high risk pregnancy, unspecified, third trimester: Secondary | ICD-10-CM

## 2019-01-07 DIAGNOSIS — O0992 Supervision of high risk pregnancy, unspecified, second trimester: Secondary | ICD-10-CM

## 2019-01-07 DIAGNOSIS — Z23 Encounter for immunization: Secondary | ICD-10-CM

## 2019-01-07 DIAGNOSIS — D509 Iron deficiency anemia, unspecified: Secondary | ICD-10-CM

## 2019-01-07 DIAGNOSIS — O98713 Human immunodeficiency virus [HIV] disease complicating pregnancy, third trimester: Secondary | ICD-10-CM

## 2019-01-07 LAB — CBC
Hematocrit: 28.7 % — ABNORMAL LOW (ref 34.0–46.6)
Hemoglobin: 9.7 g/dL — ABNORMAL LOW (ref 11.1–15.9)
MCH: 27.7 pg (ref 26.6–33.0)
MCHC: 33.8 g/dL (ref 31.5–35.7)
MCV: 82 fL (ref 79–97)
Platelets: 260 10*3/uL (ref 150–450)
RBC: 3.5 x10E6/uL — ABNORMAL LOW (ref 3.77–5.28)
RDW: 13.9 % (ref 11.7–15.4)
WBC: 12.3 10*3/uL — ABNORMAL HIGH (ref 3.4–10.8)

## 2019-01-07 LAB — GLUCOSE TOLERANCE, 2 HOURS W/ 1HR
Glucose, 1 hour: 123 mg/dL (ref 65–179)
Glucose, 2 hour: 116 mg/dL (ref 65–152)
Glucose, Fasting: 88 mg/dL (ref 65–91)

## 2019-01-07 LAB — HIV 1/2 AB DIFFERENTIATION
HIV 1 Ab: POSITIVE — AB
HIV 2 Ab: NEGATIVE
NOTE (HIV CONF MULTIP: POSITIVE

## 2019-01-07 LAB — RPR: RPR Ser Ql: NONREACTIVE

## 2019-01-07 LAB — HIV ANTIBODY (ROUTINE TESTING W REFLEX): HIV Screen 4th Generation wRfx: REACTIVE — AB

## 2019-01-07 MED ORDER — PRAMOXINE HCL (PERIANAL) 1 % EX FOAM: 1.0000 "application " | Freq: Three times a day (TID) | CUTANEOUS | 2 refills | Status: DC | PRN

## 2019-01-07 MED ORDER — FERROUS SULFATE 325 (65 FE) MG PO TABS: 325.0000 mg | ORAL_TABLET | Freq: Two times a day (BID) | ORAL | 1 refills | Status: DC

## 2019-01-07 NOTE — Patient Instructions (Signed)
Contraception Choices Contraception, also called birth control, refers to methods or devices that prevent pregnancy. Hormonal methods Contraceptive implant  A contraceptive implant is a thin, plastic tube that contains a hormone. It is inserted into the upper part of the arm. It can remain in place for up to 3 years. Progestin-only injections Progestin-only injections are injections of progestin, a synthetic form of the hormone progesterone. They are given every 3 months by a health care provider. Birth control pills  Birth control pills are pills that contain hormones that prevent pregnancy. They must be taken once a day, preferably at the same time each day. Birth control patch  The birth control patch contains hormones that prevent pregnancy. It is placed on the skin and must be changed once a week for three weeks and removed on the fourth week. A prescription is needed to use this method of contraception. Vaginal ring  A vaginal ring contains hormones that prevent pregnancy. It is placed in the vagina for three weeks and removed on the fourth week. After that, the process is repeated with a new ring. A prescription is needed to use this method of contraception. Emergency contraceptive Emergency contraceptives prevent pregnancy after unprotected sex. They come in pill form and can be taken up to 5 days after sex. They work best the sooner they are taken after having sex. Most emergency contraceptives are available without a prescription. This method should not be used as your only form of birth control. Barrier methods Female condom  A female condom is a thin sheath that is worn over the penis during sex. Condoms keep sperm from going inside a woman's body. They can be used with a spermicide to increase their effectiveness. They should be disposed after a single use. Female condom  A female condom is a soft, loose-fitting sheath that is put into the vagina before sex. The condom keeps  sperm from going inside a woman's body. They should be disposed after a single use. Diaphragm  A diaphragm is a soft, dome-shaped barrier. It is inserted into the vagina before sex, along with a spermicide. The diaphragm blocks sperm from entering the uterus, and the spermicide kills sperm. A diaphragm should be left in the vagina for 6-8 hours after sex and removed within 24 hours. A diaphragm is prescribed and fitted by a health care provider. A diaphragm should be replaced every 1-2 years, after giving birth, after gaining more than 15 lb (6.8 kg), and after pelvic surgery. Cervical cap  A cervical cap is a round, soft latex or plastic cup that fits over the cervix. It is inserted into the vagina before sex, along with spermicide. It blocks sperm from entering the uterus. The cap should be left in place for 6-8 hours after sex and removed within 48 hours. A cervical cap must be prescribed and fitted by a health care provider. It should be replaced every 2 years. Sponge  A sponge is a soft, circular piece of polyurethane foam with spermicide on it. The sponge helps block sperm from entering the uterus, and the spermicide kills sperm. To use it, you make it wet and then insert it into the vagina. It should be inserted before sex, left in for at least 6 hours after sex, and removed and thrown away within 30 hours. Spermicides Spermicides are chemicals that kill or block sperm from entering the cervix and uterus. They can come as a cream, jelly, suppository, foam, or tablet. A spermicide should be inserted into  30 hours.  Spermicides  Spermicides are chemicals that kill or block sperm from entering the cervix and uterus. They can come as a cream, jelly, suppository, foam, or tablet. A spermicide should be inserted into the vagina with an applicator at least 10-15 minutes before sex to allow time for it to work. The process must be repeated every time you have sex. Spermicides do not require a prescription.  Intrauterine contraception  Intrauterine device (IUD)  An IUD is a T-shaped device that is put in a woman's uterus. There are two types:  · Hormone IUD.This type contains progestin, a synthetic form of the hormone  progesterone. This type can stay in place for 3-5 years.  · Copper IUD.This type is wrapped in copper wire. It can stay in place for 10 years.    Permanent methods of contraception  Female tubal ligation  In this method, a woman's fallopian tubes are sealed, tied, or blocked during surgery to prevent eggs from traveling to the uterus.  Hysteroscopic sterilization  In this method, a small, flexible insert is placed into each fallopian tube. The inserts cause scar tissue to form in the fallopian tubes and block them, so sperm cannot reach an egg. The procedure takes about 3 months to be effective. Another form of birth control must be used during those 3 months.  Female sterilization  This is a procedure to tie off the tubes that carry sperm (vasectomy). After the procedure, the man can still ejaculate fluid (semen).  Natural planning methods  Natural family planning  In this method, a couple does not have sex on days when the woman could become pregnant.  Calendar method  This means keeping track of the length of each menstrual cycle, identifying the days when pregnancy can happen, and not having sex on those days.  Ovulation method  In this method, a couple avoids sex during ovulation.  Symptothermal method  This method involves not having sex during ovulation. The woman typically checks for ovulation by watching changes in her temperature and in the consistency of cervical mucus.  Post-ovulation method  In this method, a couple waits to have sex until after ovulation.  Summary  · Contraception, also called birth control, means methods or devices that prevent pregnancy.  · Hormonal methods of contraception include implants, injections, pills, patches, vaginal rings, and emergency contraceptives.  · Barrier methods of contraception can include female condoms, female condoms, diaphragms, cervical caps, sponges, and spermicides.  · There are two types of IUDs (intrauterine devices). An IUD can be put in a woman's uterus to  prevent pregnancy for 3-5 years.  · Permanent sterilization can be done through a procedure for males, females, or both.  · Natural family planning methods involve not having sex on days when the woman could become pregnant.  This information is not intended to replace advice given to you by your health care provider. Make sure you discuss any questions you have with your health care provider.  Document Released: 06/30/2005 Document Revised: 07/02/2017 Document Reviewed: 08/02/2016  Elsevier Interactive Patient Education © 2019 Elsevier Inc.    Postpartum Baby Blues  The postpartum period begins right after the birth of a baby. During this time, there is often a lot of joy and excitement. It is also a time of many changes in the life of the parents. No matter how many times a mother gives birth, each child brings new challenges to the family, including different ways of relating to one another.  It   is common to have feelings of excitement along with confusing changes in moods, emotions, and thoughts. You may feel happy one minute and sad or stressed the next. These feelings of sadness usually happen in the period right after you have your baby, and they go away within a week or two. This is called the "baby blues."  What are the causes?  There is no known cause of baby blues. It is likely caused by a combination of factors. However, changes in hormone levels after childbirth are believed to trigger some of the symptoms.  Other factors that can play a role in these mood changes include:  · Lack of sleep.  · Stressful life events, such as poverty, caring for a loved one, or death of a loved one.  · Genetics.  What are the signs or symptoms?  Symptoms of this condition include:  · Brief changes in mood, such as going from extreme happiness to sadness.  · Decreased concentration.  · Difficulty sleeping.  · Crying spells and tearfulness.  · Loss of appetite.  · Irritability.  · Anxiety.  If the symptoms of baby blues  last for more than 2 weeks or become more severe, you may have postpartum depression.  How is this diagnosed?  This condition is diagnosed based on an evaluation of your symptoms. There are no medical or lab tests that lead to a diagnosis, but there are various questionnaires that a health care provider may use to identify women with the baby blues or postpartum depression.  How is this treated?  Treatment is not needed for this condition. The baby blues usually go away on their own in 1-2 weeks. Social support is often all that is needed. You will be encouraged to get adequate sleep and rest.  Follow these instructions at home:  Lifestyle         · Get as much rest as you can. Take a nap when the baby sleeps.  · Exercise regularly as told by your health care provider. Some women find yoga and walking to be helpful.  · Eat a balanced and nourishing diet. This includes plenty of fruits and vegetables, whole grains, and lean proteins.  · Do little things that you enjoy. Have a cup of tea, take a bubble bath, read your favorite magazine, or listen to your favorite music.  · Avoid alcohol.  · Ask for help with household chores, cooking, grocery shopping, or running errands. Do not try to do everything yourself. Consider hiring a postpartum doula to help. This is a professional who specializes in providing support to new mothers.  · Try not to make any major life changes during pregnancy or right after giving birth. This can add stress.  General instructions  · Talk to people close to you about how you are feeling. Get support from your partner, family members, friends, or other new moms. You may want to join a support group.  · Find ways to cope with stress. This may include:  ? Writing your thoughts and feelings in a journal.  ? Spending time outside.  ? Spending time with people who make you laugh.  · Try to stay positive in how you think. Think about the things you are grateful for.  · Take over-the-counter and  prescription medicines only as told by your health care provider.  · Let your health care provider know if you have any concerns.  · Keep all postpartum visits as told by your health care provider.   spending time with supportive people, and finding ways to cope with stress.  If feelings of sadness and stress last longer than 2 weeks or get in the way of caring for your baby, talk to your health care provider. This may mean you have postpartum depression. This information is not intended to replace advice given to you by your health care provider. Make sure you discuss any questions you have with your health care provider. Document Released: 04/03/2004 Document Revised: 08/26/2016 Document Reviewed: 08/26/2016 Elsevier Interactive Patient Education  2019 [employer].

## 2019-01-07 NOTE — Progress Notes (Signed)
   PRENATAL VISIT NOTE  Subjective:  Terri Padilla is a 38 y.o. [id number] at [redacted]w[redacted]d being seen today for ongoing prenatal care.  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  Patient reports hemorrhoid.  Contractions: Irritability. Vag. Bleeding: None.  Movement: Present. Denies leaking of fluid.   The following portions of the patient's history were reviewed and updated as appropriate: allergies, current medications, past family history, past medical history, past social history, past surgical history and problem list.   Objective:   Vitals:   01/07/19 0847  BP: 106/72  Pulse: (!) 101  Temp: 98.4 F (36.9 C)  Weight: 200 lb 9.6 oz (91 kg)    Fetal Status:     Movement: Present     General:  Alert, oriented and cooperative. Patient is in no acute distress.  Skin: Skin is warm and dry. No rash noted.   Cardiovascular: Normal heart rate noted  Respiratory: Normal respiratory effort, no problems with respiration noted  Abdomen: Soft, gravid, appropriate for gestational age.  Pain/Pressure: Present     Pelvic: Cervical exam deferred        Extremities: Normal range of motion.  Edema: None  Mental Status: Normal mood and affect. Normal behavior. Normal judgment and thought content.   Assessment and Plan:  Pregnancy: [id number] at [redacted]w[redacted]d 1. Supervision of high risk pregnancy in second trimester 28 wk labs yesterday and normal OGT, anemia and iron started - Tdap vaccine greater than or equal to 7yo IM  2.  Previous cesarean delivery affecting pregnancy, antepartum Booked for RCS with IUD placement  3. Multigravida of advanced maternal age in third trimester Declined genetics  4. HIV infection in mother during pregnancy, antepartum On 3 meds, VL improving  5. Grade II hemorrhoids Med given - pramoxine (PROCTOFOAM) 1 % foam; Place 1 application rectally 3 (three) times daily as needed for anal itching.  Dispense: 15 g; Refill: 2  Preterm labor symptoms and general obstetric precautions including but not limited to vaginal bleeding, contractions, leaking of fluid and fetal movement were reviewed in detail with the patient. Please refer to After Visit Summary for other counseling recommendations.   Return in 2 weeks (on 01/21/2019) for in person.  Future Appointments  Date Time Provider [place]  01/20/2019  8:00 AM [place] [facility]  01/20/2019  8:00 AM [facility] [place] 3 [facility]  01/21/2019  9:35 AM Aletha Halim, MD [facility]    Donnamae Jude, MD

## 2019-01-20 ENCOUNTER — Ambulatory Visit (HOSPITAL_COMMUNITY): Payer: Self-pay | Attending: Obstetrics and Gynecology

## 2019-01-20 ENCOUNTER — Encounter (HOSPITAL_COMMUNITY): Payer: Self-pay

## 2019-01-20 ENCOUNTER — Ambulatory Visit (HOSPITAL_COMMUNITY): Payer: Self-pay

## 2019-01-20 ENCOUNTER — Telehealth: Payer: Self-pay | Admitting: Obstetrics and Gynecology

## 2019-01-20 NOTE — Telephone Encounter (Signed)
Spoke with patient about her appointment on 7/10 @ 9:35. Patient instructed to wear a face mask for the entire appointment and no visitors are allowed during the visit. Patient screened for covid symptoms and having any.

## 2019-01-21 ENCOUNTER — Encounter: Payer: Self-pay | Admitting: Obstetrics and Gynecology

## 2019-01-22 NOTE — Progress Notes (Signed)
Patient did not keep her return OB appointment for 01/21/2019.  Durene Romans MD Attending Center for [employer])

## 2019-02-21 ENCOUNTER — Encounter: Payer: Self-pay | Admitting: Obstetrics & Gynecology

## 2019-02-22 ENCOUNTER — Telehealth: Payer: Self-pay | Admitting: Obstetrics and Gynecology

## 2019-02-22 NOTE — Telephone Encounter (Signed)
The patients husband called to reschedule the missed appointment and also update the address. The patient was placed on a hold to be scheduled throughout the reminder of the pregnancy however due to the recently missed appointments the patient has not attended an appointment since June. Decided to wait until the reschedule visit is complete for an physicians opinion on the upcoming appointment. The call dropped during providing the information. Attempted to call the patient back twice however unable to reach.

## 2019-02-23 ENCOUNTER — Other Ambulatory Visit: Payer: Self-pay

## 2019-02-23 ENCOUNTER — Telehealth: Payer: Self-pay | Admitting: Obstetrics and Gynecology

## 2019-02-23 DIAGNOSIS — O0992 Supervision of high risk pregnancy, unspecified, second trimester: Secondary | ICD-10-CM

## 2019-02-23 NOTE — Progress Notes (Signed)
Called pt @ 1050 and unable to LM due to VM box not set up.  Will call pt again in 15 minutes.    Called pt @ 1110 and unable to LM due VM box not set up.  Pt will need to reschedule her appt.

## 2019-02-23 NOTE — Progress Notes (Signed)
Patient will be contacted to reschedule her [facility] appt.

## 2019-02-24 ENCOUNTER — Telehealth: Payer: Self-pay | Admitting: Family Medicine

## 2019-02-24 ENCOUNTER — Other Ambulatory Visit: Payer: Self-pay | Admitting: Obstetrics and Gynecology

## 2019-02-24 MED ORDER — GLYCOPYRROLATE 2 MG PO TABS: 2.0000 mg | ORAL_TABLET | Freq: Three times a day (TID) | ORAL | 3 refills | Status: DC | PRN

## 2019-02-24 NOTE — Telephone Encounter (Signed)
Patient's husband called in stating that he would like to make an appointment for his wife. Husband stated that he has been trying to get an appointment for a while. Patient was scheduled for an appointment on 8/12 that was a no show. Husband stated that the number that we had on file was not correct. The patient's number was updated. The husband was given a new appointment. Husband stated that his wife has been cramping and in pain. I let the husband know that I would put a note into the nurses and they will be calling back. Husband verbalized understanding.

## 2019-02-24 NOTE — Telephone Encounter (Signed)
I called Terri Padilla with [employer] [phone]. A female answered and said he is her interpreter and said she doesn't need interpreter. I asked if I could speak with Terri Padilla. Terri Padilla did come to phone. I did explain with the interpreter  when she comes to the office that if she wants to use her husband in the office she can tell us that and we can use him; but will still use an interpreter to listen to make sure no miscommunication. I explained I understood she had missed an appointment and is having some issues. I asked what issues/problems she is having. She c/o having uc's one per hour every hour every day for 2 weeks. I explained if she has 4 contractions or more for 2 hours to go to [facility].  She voiced understanding. She then c/o last few weeks having nausea and vomiting once or twice a day saliva only.  She denies any medication changes. She states she is keeping her food/ liquids down. Discussed with Dr.Constant . Explained to [facility] that Dr.Constant will send in rx for robinol to take 1 tablet TID as needed . I explained she recommended she take it TID at first for a few days and then only as needed. She voices understanding. I also reviewed her appointment date/ time with [place]. I explained we are unable to move her appointment [place] sooner .  She voices understanding.

## 2019-03-08 ENCOUNTER — Other Ambulatory Visit: Payer: Self-pay

## 2019-03-08 ENCOUNTER — Ambulatory Visit (INDEPENDENT_AMBULATORY_CARE_PROVIDER_SITE_OTHER): Payer: Self-pay | Admitting: Advanced Practice Midwife

## 2019-03-08 VITALS — BP 104/57 | HR 120 | Wt 204.0 lb

## 2019-03-08 DIAGNOSIS — O99820 Streptococcus B carrier state complicating pregnancy: Secondary | ICD-10-CM

## 2019-03-08 DIAGNOSIS — O2343 Unspecified infection of urinary tract in pregnancy, third trimester: Secondary | ICD-10-CM

## 2019-03-08 DIAGNOSIS — O98713 Human immunodeficiency virus [HIV] disease complicating pregnancy, third trimester: Secondary | ICD-10-CM

## 2019-03-08 DIAGNOSIS — O219 Vomiting of pregnancy, unspecified: Secondary | ICD-10-CM

## 2019-03-08 DIAGNOSIS — Z3A36 36 weeks gestation of pregnancy: Secondary | ICD-10-CM

## 2019-03-08 DIAGNOSIS — O2243 Hemorrhoids in pregnancy, third trimester: Secondary | ICD-10-CM

## 2019-03-08 DIAGNOSIS — O98719 Human immunodeficiency virus [HIV] disease complicating pregnancy, unspecified trimester: Secondary | ICD-10-CM

## 2019-03-08 DIAGNOSIS — Z113 Encounter for screening for infections with a predominantly sexual mode of transmission: Secondary | ICD-10-CM

## 2019-03-08 MED ORDER — ONDANSETRON HCL 4 MG PO TABS: 4.0000 mg | ORAL_TABLET | Freq: Three times a day (TID) | ORAL | 1 refills | Status: DC | PRN

## 2019-03-08 MED ORDER — HYDROCORTISONE ACETATE 25 MG RE SUPP: 25.0000 mg | Freq: Two times a day (BID) | RECTAL | 1 refills | Status: DC

## 2019-03-08 NOTE — Patient Instructions (Addendum)
Please call your Infectious Disease doctors to schedule your next appointment. If they can see you in the next week and draw blood, then we will not need to draw blood at you next visit here.  You need a growth ultrasound. We can do in in Maternal Fetal Medicine in our building or, if you do not get approved for Medicaid we can schedule it at the [facility] for about $180.   Braxton Hicks Contractions Contractions of the uterus can occur throughout pregnancy, but they are not always a sign that you are in labor. You may have practice contractions called Braxton Hicks contractions. These false labor contractions are sometimes confused with true labor. What are Deberah PeltonBraxton Hicks contractions? Braxton Hicks contractions are tightening movements that occur in the muscles of the uterus before labor. Unlike true labor contractions, these contractions do not result in opening (dilation) and thinning of the cervix. Toward the end of pregnancy (32-34 weeks), Braxton Hicks contractions can happen more often and may become stronger. These contractions are sometimes difficult to tell apart from true labor because they can be very uncomfortable. You should not feel embarrassed if you go to the hospital with false labor. Sometimes, the only way to tell if you are in true labor is for your health care provider to look for changes in the cervix. The health care provider will do a physical exam and may monitor your contractions. If you are not in true labor, the exam should show that your cervix is not dilating and your water has not broken. If there are no other health problems associated with your pregnancy, it is completely safe for you to be sent home with false labor. You may continue to have Braxton Hicks contractions until you go into true labor. How to tell the difference between true labor and false labor True labor  Contractions last 30-70 seconds.  Contractions become very regular.  Discomfort is  usually felt in the top of the uterus, and it spreads to the lower abdomen and low back.  Contractions do not go away with walking.  Contractions usually become more intense and increase in frequency.  The cervix dilates and gets thinner. False labor  Contractions are usually shorter and not as strong as true labor contractions.  Contractions are usually irregular.  Contractions are often felt in the front of the lower abdomen and in the groin.  Contractions may go away when you walk around or change positions while lying down.  Contractions get weaker and are shorter-lasting as time goes on.  The cervix usually does not dilate or become thin. Follow these instructions at home:   Take over-the-counter and prescription medicines only as told by your health care provider.  Keep up with your usual exercises and follow other instructions from your health care provider.  Eat and drink lightly if you think you are going into labor.  If Braxton Hicks contractions are making you uncomfortable: ? Change your position from lying down or resting to walking, or change from walking to resting. ? Sit and rest in a tub of warm water. ? Drink enough fluid to keep your urine pale yellow. Dehydration may cause these contractions. ? Do slow and deep breathing several times an hour.  Keep all follow-up prenatal visits as told by your health care provider. This is important. Contact a health care provider if:  You have a fever.  You have continuous pain in your abdomen. Get help right away if:  Your contractions become  stronger, more regular, and closer together.  You have fluid leaking or gushing from your vagina.  You pass blood-tinged mucus (bloody show).  You have bleeding from your vagina.  You have low back pain that you never had before.  You feel your baby's head pushing down and causing pelvic pressure.  Your baby is not moving inside you as much as it used to. Summary   Contractions that occur before labor are called Braxton Hicks contractions, false labor, or practice contractions.  Braxton Hicks contractions are usually shorter, weaker, farther apart, and less regular than true labor contractions. True labor contractions usually become progressively stronger and regular, and they become more frequent.  Manage discomfort from [facility] contractions by changing position, resting in a warm bath, drinking plenty of water, or practicing deep breathing. This information is not intended to replace advice given to you by your health care provider. Make sure you discuss any questions you have with your health care provider. Document Released: 11/13/2016 Document Revised: 06/12/2017 Document Reviewed: 11/13/2016 Elsevier Patient Education  2020 [employer].

## 2019-03-08 NOTE — Progress Notes (Signed)
   PRENATAL VISIT NOTE  Subjective:  Terri Padilla is a 38 y.o. [id number] at [redacted]w[redacted]d being seen today for ongoing prenatal care.  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  Patient reports occasional contractions.  Contractions: Irregular. Vag. Bleeding: None.  Movement: Present. Denies leaking of fluid.   The following portions of the patient's history were reviewed and updated as appropriate: allergies, current medications, past family history, past medical history, past social history, past surgical history and problem list.   Objective:   Vitals:   03/08/19 1324  BP: (!) 104/57  Pulse: (!) 120  Weight: 204 lb (92.5 kg)    Fetal Status: Fetal Heart Rate (bpm): 147   Movement: Present     General:  Alert, oriented and cooperative. Patient is in no acute distress.  Skin: Skin is warm and dry. No rash noted.   Cardiovascular: Normal heart rate noted  Respiratory: Normal respiratory effort, no problems with respiration noted  Abdomen: Soft, gravid, appropriate for gestational age.  Pain/Pressure: Present     Pelvic: Cervical exam performed        Extremities: Normal range of motion.  Edema: None  Mental Status: Normal mood and affect. Normal behavior. Normal judgment and thought content.   Assessment and Plan:  Pregnancy: [id number] at [redacted]w[redacted]d 1. Urinary tract infection in mother during third trimester of pregnancy - Urine TOC  2. 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 Does not have F/U appt scheduled w/ ID. Recommended she call to schedule one. - Recommend checking viral load today. States she want to wait until next visit because she is very tired.   3. Previous C/S x 4 - Repeat scheduled 03/25/19.  4. Language Barrier - Pt declined interpreter. Able to repeat back instructions.   Term labor symptoms and general obstetric precautions including but not limited to vaginal bleeding, contractions, leaking of fluid and fetal movement were reviewed in detail with the patient. Please refer to After Visit Summary for other counseling recommendations.   Return in about 1 week (around 03/15/2019) for ROB, In-person, HIV viral load.  Future Appointments  Date Time Provider [place]  03/17/2019  2:00 PM Carlyle Basques, MD [facility]  03/18/2019 10:35 AM Aletha Halim, MD [facility] [place]  03/23/2019  8:10 AM [facility] 1 [facility] None    Manya Silvas, CNM

## 2019-03-09 LAB — GC/CHLAMYDIA PROBE AMP (~~LOC~~) NOT AT ARMC
Chlamydia: NEGATIVE
Neisseria Gonorrhea: NEGATIVE

## 2019-03-10 LAB — URINE CULTURE, OB REFLEX

## 2019-03-10 LAB — CULTURE, OB URINE

## 2019-03-11 ENCOUNTER — Other Ambulatory Visit: Payer: Self-pay | Admitting: Internal Medicine

## 2019-03-11 DIAGNOSIS — O0991 Supervision of high risk pregnancy, unspecified, first trimester: Secondary | ICD-10-CM

## 2019-03-11 DIAGNOSIS — B20 Human immunodeficiency virus [HIV] disease: Secondary | ICD-10-CM

## 2019-03-11 LAB — CULTURE, BETA STREP (GROUP B ONLY): Strep Gp B Culture: POSITIVE — AB

## 2019-03-13 DIAGNOSIS — O99820 Streptococcus B carrier state complicating pregnancy: Secondary | ICD-10-CM | POA: Insufficient documentation

## 2019-03-14 ENCOUNTER — Encounter (HOSPITAL_COMMUNITY): Payer: Self-pay

## 2019-03-14 NOTE — Patient Instructions (Addendum)
Terri Padilla  03/14/2019   Your procedure is scheduled on:  03/25/2019  Arrive at 0600 at Entrance C on [address] at [facility]  and [address]. You are invited to use the FREE valet parking or use the Visitor's parking deck.  Pick up the phone at the desk and dial [phone].  Call this number if you have problems the morning of surgery: [phone]  Remember:   Do not eat food:(After Midnight) Desps de medianoche.  Do not drink clear liquids: (After Midnight) Desps de medianoche.  Take these medicines the morning of surgery with A SIP OF WATER:  Take your Truvada, and robinul the morning of surgery.  You will take the other morning medications after surgery when you can eat.   Do not wear jewelry, make-up or nail polish.  Do not wear lotions, powders, or perfumes. Do not wear deodorant.  Do not shave 48 hours prior to surgery.  Do not bring valuables to the hospital.  [facility] is not   responsible for any belongings or valuables brought to the hospital.  Contacts, dentures or bridgework may not be worn into surgery.  Leave suitcase in the car. After surgery it may be brought to your room.  For patients admitted to the hospital, checkout time is 11:00 AM the day of              discharge.      Please read over the following fact sheets that you were given:     Preparing for Surgery

## 2019-03-16 ENCOUNTER — Telehealth: Payer: Self-pay

## 2019-03-16 NOTE — Telephone Encounter (Signed)
COVID-19 Pre-Screening Questions:03/16/19   Do you currently have a fever (>100 F), chills or unexplained body aches?NO  Are you currently experiencing new cough, shortness of breath, sore throat, runny nose? NO .  Have you recently travelled outside the state of [place] in the last 14 days?NO  Have you been in contact with someone that is currently pending confirmation of Covid19 testing or has been confirmed to have the [place] virus? NO  **If the patient answers NO to ALL questions -  advise the patient to please call the clinic before coming to the office should any symptoms develop.

## 2019-03-17 ENCOUNTER — Telehealth: Payer: Self-pay | Admitting: Obstetrics and Gynecology

## 2019-03-17 ENCOUNTER — Other Ambulatory Visit: Payer: Self-pay

## 2019-03-17 ENCOUNTER — Ambulatory Visit (INDEPENDENT_AMBULATORY_CARE_PROVIDER_SITE_OTHER): Payer: Self-pay | Admitting: Internal Medicine

## 2019-03-17 ENCOUNTER — Encounter: Payer: Self-pay | Admitting: Internal Medicine

## 2019-03-17 VITALS — BP 108/71 | HR 109 | Temp 98.7°F

## 2019-03-17 DIAGNOSIS — Z23 Encounter for immunization: Secondary | ICD-10-CM

## 2019-03-17 DIAGNOSIS — B20 Human immunodeficiency virus [HIV] disease: Secondary | ICD-10-CM

## 2019-03-17 DIAGNOSIS — O0991 Supervision of high risk pregnancy, unspecified, first trimester: Secondary | ICD-10-CM

## 2019-03-17 MED ORDER — GENVOYA 150-150-200-10 MG PO TABS: 1.0000 | ORAL_TABLET | Freq: Every day | ORAL | 11 refills | Status: DC

## 2019-03-17 MED ORDER — DARUNAVIR ETHANOLATE 800 MG PO TABS: 800.0000 mg | ORAL_TABLET | Freq: Every day | ORAL | 11 refills | Status: DC

## 2019-03-17 NOTE — Progress Notes (Signed)
RFV: follow up for hiv disease  Patient ID: Terri Padilla, female   DOB: 06/22/1981, 38 y.o.   MRN: [id number]  HPI 38yo F with hiv disease, CD 4 count 1520/VL 80 on truvada-DRVr previously on genvoya-DRV. She has scheduled c-section on sep 11th. Has been taking meds without missing dose. This past month had difficulty with getting meds from pharmacy and wants to use mail order  Outpatient Encounter Medications as of 03/17/2019  Medication Sig  . ferrous sulfate (FERROUSUL) 325 (65 FE) MG tablet Take 1 tablet (325 mg total) by mouth 2 (two) times daily.  . folic acid (FOLVITE) 1 MG tablet Take 1 tablet (1 mg total) by mouth daily.  . NORVIR 100 MG TABS tablet TAKE 1 TABLET BY MOUTH DAILY WITH BREAKFAST  . ondansetron (ZOFRAN) 4 MG tablet Take 1 tablet (4 mg total) by mouth every 8 (eight) hours as needed for nausea or vomiting.  . Prenatal Multivit-Min-Fe-FA (PRENATAL VITAMINS) 0.8 MG tablet Take 1 tablet by mouth daily.  Marland Kitchen. PREZISTA 800 MG tablet TAKE 1 TABLET(800 MG) BY MOUTH DAILY WITH BREAKFAST  . TRUVADA 200-300 MG tablet TAKE 1 TABLET BY MOUTH DAILY  . acetaminophen (TYLENOL) 325 MG tablet Take 2 tablets (650 mg total) by mouth every 4 (four) hours as needed (for pain scale < 4). (Patient not taking: Reported on 03/17/2019)  . aspirin EC 81 MG tablet Take 1 tablet (81 mg total) by mouth daily. (Patient not taking: Reported on 01/07/2019)  . GENVOYA 150-150-200-10 MG TABS tablet TK 1 T PO D WITH BRE  . glycopyrrolate (ROBINUL) 2 MG tablet Take 1 tablet (2 mg total) by mouth 3 (three) times daily as needed. (Patient not taking: Reported on 03/17/2019)  . hydrocortisone (ANUSOL-HC) 25 MG suppository Place 1 suppository (25 mg total) rectally 2 (two) times daily. (Patient not taking: Reported on 03/17/2019)  . pramoxine (PROCTOFOAM) 1 % foam Place 1 application rectally 3 (three) times daily as needed for anal itching. (Patient not taking: Reported on 03/17/2019)   No  facility-administered encounter medications on file as of 03/17/2019.      Patient Active Problem List   Diagnosis Date Noted  . Group B Streptococcus carrier, antepartum 03/13/2019  . Maternal iron deficiency anemia affecting pregnancy in third trimester, antepartum 01/07/2019  . Obesity in pregnancy 12/15/2018  . UTI in pregnancy 11/15/2018  . Supervision of high risk pregnancy in second trimester 11/10/2018  . Language barrier 11/10/2018  . 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 Umbilical hernia 03/12/2016  . 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 AMA (advanced maternal age) multigravida 35+ 02/21/2016  . Previous cesarean delivery affecting pregnancy, antepartum 02/21/2016  . Down syndrome in child of prior pregnancy, currently pregnant 02/21/2016     Health Maintenance Due  Topic Date Due  . INFLUENZA VACCINE  02/12/2019    Sochx: no smoking and no alcohol use Review of Systems Review of Systems  Constitutional: Negative for fever, chills, diaphoresis, activity change, appetite change, fatigue and unexpected weight change.  HENT: Negative for congestion, sore throat, rhinorrhea, sneezing, trouble swallowing and sinus pressure.  Eyes: Negative for photophobia and visual disturbance.  Respiratory: Negative for cough, chest tightness, shortness of breath, wheezing and stridor.  Cardiovascular: Negative for chest pain, palpitations and leg swelling.  Gastrointestinal: Negative for nausea, vomiting, abdominal pain, diarrhea, constipation, blood in stool, abdominal distention and anal bleeding.  Genitourinary: Negative for dysuria, hematuria, flank pain and  difficulty urinating.  Musculoskeletal: Negative for myalgias, back pain, joint swelling, arthralgias and gait problem.  Skin: Negative for color change, pallor, rash and wound.  Neurological: Negative for dizziness, tremors, weakness and light-headedness.   Hematological: Negative for adenopathy. Does not bruise/bleed easily.  Psychiatric/Behavioral: Negative for behavioral problems, confusion, sleep disturbance, dysphoric mood, decreased concentration and agitation.    Physical Exam   BP 108/71   Pulse (!) 109   Temp 98.7 F (37.1 C) (Oral)   LMP 07/08/2018 (Approximate)   Physical Exam  Constitutional:  oriented to person, place, and time. appears well-developed and well-nourished. No distress.  HENT: /AT, PERRLA, no scleral icterus Mouth/Throat: Oropharynx is clear and moist. No oropharyngeal exudate.  Cardiovascular: Normal rate, regular rhythm and normal heart sounds. Exam reveals no gallop and no friction rub.  No murmur heard.  Pulmonary/Chest: Effort normal and breath sounds normal. No respiratory distress.  has no wheezes.  Neck = supple, no nuchal rigidity Abdominal: Soft. Bowel sounds are normal. +gravid Lymphadenopathy: no cervical adenopathy. No axillary adenopathy Neurological: alert and oriented to person, place, and time.  Skin: Skin is warm and dry. No rash noted. No erythema.  Psychiatric: a normal mood and affect.  behavior is normal.   Lab Results  Component Value Date   CD4TCELL 44 12/27/2018   Lab Results  Component Value Date   CD4TABS 1,525 12/27/2018   CD4TABS 1,570 04/21/2018   CD4TABS 1,220 12/08/2017   Lab Results  Component Value Date   HIV1RNAQUANT 80 (H) 12/27/2018   Lab Results  Component Value Date   HEPBSAB NEG 03/04/2016   Lab Results  Component Value Date   LABRPR Non Reactive 01/06/2019    CBC Lab Results  Component Value Date   WBC 12.3 (H) 01/06/2019   RBC 3.50 (L) 01/06/2019   HGB 9.7 (L) 01/06/2019   HCT 28.7 (L) 01/06/2019   PLT 260 01/06/2019   MCV 82 01/06/2019   MCH 27.7 01/06/2019   MCHC 33.8 01/06/2019   RDW 13.9 01/06/2019   LYMPHSABS 3,706 12/27/2018   MONOABS 546 03/04/2016   EOSABS 114 12/27/2018    BMET Lab Results  Component Value Date   NA 136  12/27/2018   K 4.0 12/27/2018   CL 104 12/27/2018   CO2 23 12/27/2018   GLUCOSE 84 12/27/2018   BUN 4 (L) 12/27/2018   CREATININE 0.53 12/27/2018   CALCIUM 9.2 12/27/2018   GFRNONAA 120 12/27/2018   GFRAA 140 12/27/2018      Assessment and Plan  Health maintenance = Will give flu vaccine today  hiv disease = Will check hiv viral load (since had small viral blip at last blood draw  Plan on switching back to genvoya-DRV after pregnancy. Will change this in the computer  Pregnancy = continue with multivitamin  Will see back in 4 months

## 2019-03-17 NOTE — Telephone Encounter (Signed)
Attempted to call patient w/ Arabic interpreter ID# [phone] about her appointment on 9/4 @ 10:34. Patient's husband answered the phone and stated she is currently not there. Husband instructed patient must wear a face mask for the entire appointment and no visitors are allowed during the visit. Patient's husband verbalized that the patient does not have any symptoms

## 2019-03-18 ENCOUNTER — Ambulatory Visit (INDEPENDENT_AMBULATORY_CARE_PROVIDER_SITE_OTHER): Payer: Self-pay | Admitting: Obstetrics and Gynecology

## 2019-03-18 ENCOUNTER — Telehealth: Payer: Self-pay | Admitting: Family Medicine

## 2019-03-18 VITALS — BP 118/64 | HR 99 | Wt 206.2 lb

## 2019-03-18 DIAGNOSIS — O98719 Human immunodeficiency virus [HIV] disease complicating pregnancy, unspecified trimester: Secondary | ICD-10-CM

## 2019-03-18 DIAGNOSIS — O99210 Obesity complicating pregnancy, unspecified trimester: Secondary | ICD-10-CM

## 2019-03-18 DIAGNOSIS — O98713 Human immunodeficiency virus [HIV] disease complicating pregnancy, third trimester: Secondary | ICD-10-CM

## 2019-03-18 DIAGNOSIS — Z758 Other problems related to medical facilities and other health care: Secondary | ICD-10-CM

## 2019-03-18 DIAGNOSIS — O34219 Maternal care for unspecified type scar from previous cesarean delivery: Secondary | ICD-10-CM

## 2019-03-18 DIAGNOSIS — O09523 Supervision of elderly multigravida, third trimester: Secondary | ICD-10-CM

## 2019-03-18 DIAGNOSIS — O99213 Obesity complicating pregnancy, third trimester: Secondary | ICD-10-CM

## 2019-03-18 DIAGNOSIS — O0993 Supervision of high risk pregnancy, unspecified, third trimester: Secondary | ICD-10-CM

## 2019-03-18 DIAGNOSIS — Z789 Other specified health status: Secondary | ICD-10-CM

## 2019-03-18 DIAGNOSIS — N879 Dysplasia of cervix uteri, unspecified: Secondary | ICD-10-CM

## 2019-03-18 DIAGNOSIS — O99013 Anemia complicating pregnancy, third trimester: Secondary | ICD-10-CM

## 2019-03-18 DIAGNOSIS — K429 Umbilical hernia without obstruction or gangrene: Secondary | ICD-10-CM

## 2019-03-18 DIAGNOSIS — O0990 Supervision of high risk pregnancy, unspecified, unspecified trimester: Secondary | ICD-10-CM

## 2019-03-18 DIAGNOSIS — D509 Iron deficiency anemia, unspecified: Secondary | ICD-10-CM

## 2019-03-18 NOTE — Telephone Encounter (Signed)
Can you make it at the [place] office? Pt now lives in [place] you make it at the [place] office? Pt now lives in [place] the patient of calling the [facility] after the delivery or we will call the [facility] office after the delivery to schedule future appointments.

## 2019-03-19 NOTE — Progress Notes (Addendum)
Prenatal Visit Note Date: 03/18/2019 Clinic: [facility]  Subjective:  Terri Padilla is a 38 y.o. [id number] at [redacted]w[redacted]d being seen today for ongoing prenatal care.  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  Patient reports no complaints.   Contractions: Irregular. Vag. Bleeding: None.  Movement: Present. Denies leaking of fluid.   The following portions of the patient's history were reviewed and updated as appropriate: allergies, current medications, past family history, past medical history, past social history, past surgical history and problem list. Problem list updated.  Objective:   Vitals:   03/18/19 1100  BP: 118/64  Pulse: 99  Weight: 206 lb 3.2 oz (93.5 kg)    Fetal Status: Fetal Heart Rate (bpm): 140s Fundal Height: 38 cm Movement: Present  Presentation: Vertex  General:  Alert, oriented and cooperative. Patient is in no acute distress.  Skin: Skin is warm and dry. No rash noted.   Cardiovascular: Normal heart rate noted  Respiratory: Normal respiratory effort, no problems with respiration noted  Abdomen: Soft, gravid, appropriate for gestational age. Pain/Pressure: Present     Pelvic:  Cervical exam deferred        Extremities: Normal range of motion.  Edema: None  Mental Status: Normal mood and affect. Normal behavior. Normal judgment and thought content.    Urinalysis:      Assessment and Plan:  Pregnancy: [id number] at [redacted]w[redacted]d  1. Cervix dysplasia Repeat pap postpartum  2. Supervision of high risk pregnancy, antepartum Routine care [place] IUD (due to religious reasons)  3. HIV infection in mother during pregnancy, antepartum VL from yesterday pending; last one was 58 on 6/15, normal CD4 count Per note, plan is to switch back to genvoya-DRV after pregnancy and RTC 4 months Peds prefers AZT pre delivery so will set this up  4. Umbilical hernia without obstruction and without gangrene No issues  5. Previous cesarean delivery affecting pregnancy, antepartum For rpt and LNG IUD insertion o 9/11  6. Multigravida of advanced maternal age in third trimester No issues  7. Maternal iron deficiency anemia affecting pregnancy in third trimester, antepartum No issues  8. Language barrier Declines interpreter  9. Obesity in pregnancy stable  Term labor symptoms and general obstetric precautions including but not limited to vaginal bleeding, contractions, leaking of fluid and fetal movement were reviewed in detail with the patient. Please refer to After Visit Summary for other counseling recommendations.  Return for 14-17d rn incision check.   Aletha Halim, MD

## 2019-03-21 LAB — HIV-1 RNA QUANT-NO REFLEX-BLD
HIV 1 RNA Quant: <20 copies/mL
HIV-1 RNA Quant, Log: <1.3 Log copies/mL

## 2019-03-23 ENCOUNTER — Other Ambulatory Visit: Payer: Self-pay

## 2019-03-23 ENCOUNTER — Other Ambulatory Visit (HOSPITAL_COMMUNITY)
Admission: RE | Admit: 2019-03-23 | Discharge: 2019-03-23 | Disposition: A | Discharge status: Home / Self Care | Payer: Self-pay | Source: Ambulatory Visit | Attending: Obstetrics and Gynecology | Admitting: Obstetrics and Gynecology

## 2019-03-23 DIAGNOSIS — Z01812 Encounter for preprocedural laboratory examination: Secondary | ICD-10-CM | POA: Insufficient documentation

## 2019-03-23 DIAGNOSIS — Z20828 Contact with and (suspected) exposure to other viral communicable diseases: Secondary | ICD-10-CM | POA: Insufficient documentation

## 2019-03-23 LAB — RPR: RPR Ser Ql: NONREACTIVE

## 2019-03-23 LAB — CBC
HCT: 28.9 % — ABNORMAL LOW (ref 36.0–46.0)
Hemoglobin: 9.7 g/dL — ABNORMAL LOW (ref 12.0–15.0)
MCH: 26.9 pg (ref 26.0–34.0)
MCHC: 33.6 g/dL (ref 30.0–36.0)
MCV: 80.3 fL (ref 80.0–100.0)
Platelets: 257 10*3/uL (ref 150–400)
RBC: 3.6 MIL/uL — ABNORMAL LOW (ref 3.87–5.11)
RDW: 15.3 % (ref 11.5–15.5)
WBC: 10 10*3/uL (ref 4.0–10.5)
nRBC: 0 % (ref 0.0–0.2)

## 2019-03-23 LAB — TYPE AND SCREEN
ABO/RH(D): O POS
Antibody Screen: NEGATIVE

## 2019-03-23 LAB — SARS CORONAVIRUS 2 BY RT PCR (HOSPITAL ORDER, PERFORMED IN ~~LOC~~ HOSPITAL LAB): SARS Coronavirus 2: NEGATIVE

## 2019-03-23 LAB — ABO/RH: ABO/RH(D): O POS

## 2019-03-23 NOTE — MAU Note (Signed)
Covid swab collected.PT asymptomatic PT tolerated well. Lab called for blood draw, pt sent to lobby to wait for phlebotomist to draw blood

## 2019-03-24 ENCOUNTER — Encounter (HOSPITAL_COMMUNITY): Payer: Self-pay | Admitting: Anesthesiology

## 2019-03-24 NOTE — Anesthesia Preprocedure Evaluation (Addendum)
Anesthesia Evaluation  Patient identified by MRN, date of birth, ID band Patient awake    Reviewed: Allergy & Precautions, NPO status , Patient's Chart, lab work & pertinent test results  Airway Mallampati: III  TM Distance: >3 FB Neck ROM: Full    Dental no notable dental hx. (+) Teeth Intact   Pulmonary neg pulmonary ROS,    Pulmonary exam normal breath sounds clear to auscultation       Cardiovascular negative cardio ROS Normal cardiovascular exam Rhythm:Regular Rate:Normal     Neuro/Psych negative neurological ROS  negative psych ROS   GI/Hepatic negative GI ROS, Neg liver ROS,   Endo/Other  Obesity  Renal/GU negative Renal ROS  negative genitourinary   Musculoskeletal negative musculoskeletal ROS (+)   Abdominal (+) + obese,   Peds  Hematology  (+) anemia , HIV, Viral load undetected   Anesthesia Other Findings   Reproductive/Obstetrics (+) Pregnancy Previous C/section x 4                           Anesthesia Physical Anesthesia Plan  ASA: III  Anesthesia Plan: Combined Spinal and Epidural and Spinal   Post-op Pain Management:    Induction:   PONV Risk Score and Plan: 4 or greater and Ondansetron, Treatment may vary due to age or medical condition and Scopolamine patch - Pre-op  Airway Management Planned: Natural Airway  Additional Equipment:   Intra-op Plan:   Post-operative Plan:   Informed Consent: I have reviewed the patients History and Physical, chart, labs and discussed the procedure including the risks, benefits and alternatives for the proposed anesthesia with the patient or authorized representative who has indicated his/her understanding and acceptance.     Dental advisory given  Plan Discussed with: CRNA and Surgeon  Anesthesia Plan Comments:       Anesthesia Quick Evaluation

## 2019-03-25 ENCOUNTER — Inpatient Hospital Stay (HOSPITAL_COMMUNITY)
Admission: RE | Admit: 2019-03-25 | Discharge: 2019-03-27 | DRG: 787 | Disposition: A | Discharge status: Home / Self Care | Payer: Medicaid Other | Attending: Obstetrics and Gynecology | Admitting: Obstetrics and Gynecology

## 2019-03-25 ENCOUNTER — Other Ambulatory Visit: Payer: Self-pay

## 2019-03-25 ENCOUNTER — Encounter (HOSPITAL_COMMUNITY): Payer: Self-pay | Admitting: *Deleted

## 2019-03-25 ENCOUNTER — Inpatient Hospital Stay (HOSPITAL_COMMUNITY): Payer: Medicaid Other | Admitting: Anesthesiology

## 2019-03-25 ENCOUNTER — Encounter (HOSPITAL_COMMUNITY)
Admission: RE | Disposition: A | Discharge status: Home / Self Care | Payer: Self-pay | Source: Home / Self Care | Attending: Obstetrics and Gynecology

## 2019-03-25 DIAGNOSIS — O98719 Human immunodeficiency virus [HIV] disease complicating pregnancy, unspecified trimester: Secondary | ICD-10-CM | POA: Diagnosis present

## 2019-03-25 DIAGNOSIS — O9902 Anemia complicating childbirth: Secondary | ICD-10-CM | POA: Diagnosis present

## 2019-03-25 DIAGNOSIS — O34211 Maternal care for low transverse scar from previous cesarean delivery: Principal | ICD-10-CM | POA: Diagnosis present

## 2019-03-25 DIAGNOSIS — D509 Iron deficiency anemia, unspecified: Secondary | ICD-10-CM | POA: Diagnosis present

## 2019-03-25 DIAGNOSIS — O09529 Supervision of elderly multigravida, unspecified trimester: Secondary | ICD-10-CM

## 2019-03-25 DIAGNOSIS — Z30430 Encounter for insertion of intrauterine contraceptive device: Secondary | ICD-10-CM

## 2019-03-25 DIAGNOSIS — Z3A39 39 weeks gestation of pregnancy: Secondary | ICD-10-CM

## 2019-03-25 DIAGNOSIS — N90812 Female genital mutilation Type II status: Secondary | ICD-10-CM | POA: Diagnosis present

## 2019-03-25 DIAGNOSIS — O9872 Human immunodeficiency virus [HIV] disease complicating childbirth: Secondary | ICD-10-CM | POA: Diagnosis not present

## 2019-03-25 DIAGNOSIS — Z975 Presence of (intrauterine) contraceptive device: Secondary | ICD-10-CM

## 2019-03-25 DIAGNOSIS — B20 Human immunodeficiency virus [HIV] disease: Secondary | ICD-10-CM | POA: Diagnosis present

## 2019-03-25 DIAGNOSIS — O99214 Obesity complicating childbirth: Secondary | ICD-10-CM | POA: Diagnosis present

## 2019-03-25 DIAGNOSIS — O99824 Streptococcus B carrier state complicating childbirth: Secondary | ICD-10-CM | POA: Diagnosis present

## 2019-03-25 DIAGNOSIS — E669 Obesity, unspecified: Secondary | ICD-10-CM | POA: Diagnosis present

## 2019-03-25 DIAGNOSIS — O3473 Maternal care for abnormality of vulva and perineum, third trimester: Secondary | ICD-10-CM | POA: Diagnosis present

## 2019-03-25 DIAGNOSIS — Z21 Asymptomatic human immunodeficiency virus [HIV] infection status: Secondary | ICD-10-CM | POA: Diagnosis not present

## 2019-03-25 DIAGNOSIS — Z98891 History of uterine scar from previous surgery: Secondary | ICD-10-CM

## 2019-03-25 LAB — CBC
HCT: 29.1 % — ABNORMAL LOW (ref 36.0–46.0)
Hemoglobin: 9.7 g/dL — ABNORMAL LOW (ref 12.0–15.0)
MCH: 27.2 pg (ref 26.0–34.0)
MCHC: 33.3 g/dL (ref 30.0–36.0)
MCV: 81.5 fL (ref 80.0–100.0)
Platelets: 269 10*3/uL (ref 150–400)
RBC: 3.57 MIL/uL — ABNORMAL LOW (ref 3.87–5.11)
RDW: 15.2 % (ref 11.5–15.5)
WBC: 9.8 10*3/uL (ref 4.0–10.5)
nRBC: 0 % (ref 0.0–0.2)

## 2019-03-25 SURGERY — Surgical Case
Anesthesia: Spinal | Wound class: Clean Contaminated

## 2019-03-25 MED ORDER — KETOROLAC TROMETHAMINE 30 MG/ML IJ SOLN
INTRAMUSCULAR | Status: AC
  Filled 2019-03-25: qty 1

## 2019-03-25 MED ORDER — PHENYLEPHRINE HCL-NACL 20-0.9 MG/250ML-% IV SOLN
INTRAVENOUS | Status: DC | PRN
  Administered 2019-03-25: 60 ug/min via INTRAVENOUS

## 2019-03-25 MED ORDER — LACTATED RINGERS IV SOLN
INTRAVENOUS | Status: DC | PRN
  Administered 2019-03-25 (×2): via INTRAVENOUS

## 2019-03-25 MED ORDER — DIBUCAINE (PERIANAL) 1 % EX OINT: 1.0000 "application " | TOPICAL_OINTMENT | CUTANEOUS | Status: DC | PRN

## 2019-03-25 MED ORDER — DARUNAVIR ETHANOLATE 800 MG PO TABS
800.0000 mg | ORAL_TABLET | Freq: Every day | ORAL | Status: DC
  Administered 2019-03-26 – 2019-03-27 (×2): 800 mg via ORAL
  Filled 2019-03-25 (×4): qty 1

## 2019-03-25 MED ORDER — OXYCODONE HCL 5 MG PO TABS
5.0000 mg | ORAL_TABLET | ORAL | Status: DC | PRN
  Administered 2019-03-26: 10 mg via ORAL
  Administered 2019-03-27 (×2): 5 mg via ORAL
  Administered 2019-03-27: 10 mg via ORAL
  Filled 2019-03-25: qty 1
  Filled 2019-03-25 (×2): qty 2
  Filled 2019-03-25: qty 1

## 2019-03-25 MED ORDER — KETOROLAC TROMETHAMINE 30 MG/ML IJ SOLN: 30.0000 mg | Freq: Four times a day (QID) | INTRAMUSCULAR | Status: AC | PRN

## 2019-03-25 MED ORDER — SOD CITRATE-CITRIC ACID 500-334 MG/5ML PO SOLN
ORAL | Status: AC
  Filled 2019-03-25: qty 30

## 2019-03-25 MED ORDER — LACTATED RINGERS IV SOLN: INTRAVENOUS | Status: DC

## 2019-03-25 MED ORDER — DIPHENHYDRAMINE HCL 25 MG PO CAPS: 25.0000 mg | ORAL_CAPSULE | Freq: Four times a day (QID) | ORAL | Status: DC | PRN

## 2019-03-25 MED ORDER — ZIDOVUDINE 10 MG/ML IV SOLN
2.0000 mg/kg | Freq: Once | INTRAVENOUS | Status: AC
  Administered 2019-03-25: 187 mg via INTRAVENOUS
  Filled 2019-03-25: qty 18.7

## 2019-03-25 MED ORDER — MENTHOL 3 MG MT LOZG: 1.0000 | LOZENGE | OROMUCOSAL | Status: DC | PRN

## 2019-03-25 MED ORDER — SIMETHICONE 80 MG PO CHEW
80.0000 mg | CHEWABLE_TABLET | Freq: Three times a day (TID) | ORAL | Status: DC
  Administered 2019-03-25 – 2019-03-27 (×8): 80 mg via ORAL
  Filled 2019-03-25 (×8): qty 1

## 2019-03-25 MED ORDER — MEPERIDINE HCL 25 MG/ML IJ SOLN: 6.2500 mg | INTRAMUSCULAR | Status: DC | PRN

## 2019-03-25 MED ORDER — FENTANYL CITRATE (PF) 100 MCG/2ML IJ SOLN: 25.0000 ug | INTRAMUSCULAR | Status: DC | PRN

## 2019-03-25 MED ORDER — NALBUPHINE HCL 10 MG/ML IJ SOLN
5.0000 mg | Freq: Once | INTRAMUSCULAR | Status: DC | PRN
  Filled 2019-03-25: qty 0.5

## 2019-03-25 MED ORDER — KETOROLAC TROMETHAMINE 30 MG/ML IJ SOLN
30.0000 mg | Freq: Four times a day (QID) | INTRAMUSCULAR | Status: AC | PRN
  Administered 2019-03-25: 30 mg via INTRAMUSCULAR

## 2019-03-25 MED ORDER — ONDANSETRON HCL 4 MG/2ML IJ SOLN
INTRAMUSCULAR | Status: DC | PRN
  Administered 2019-03-25: 4 mg via INTRAVENOUS

## 2019-03-25 MED ORDER — NALBUPHINE HCL 10 MG/ML IJ SOLN
5.0000 mg | INTRAMUSCULAR | Status: DC | PRN
  Filled 2019-03-25: qty 0.5

## 2019-03-25 MED ORDER — OXYTOCIN 40 UNITS IN NORMAL SALINE INFUSION - SIMPLE MED
INTRAVENOUS | Status: AC
  Filled 2019-03-25: qty 1000

## 2019-03-25 MED ORDER — NALOXONE HCL 0.4 MG/ML IJ SOLN: 0.4000 mg | INTRAMUSCULAR | Status: DC | PRN

## 2019-03-25 MED ORDER — PHENYLEPHRINE HCL-NACL 20-0.9 MG/250ML-% IV SOLN
INTRAVENOUS | Status: AC
  Filled 2019-03-25: qty 250

## 2019-03-25 MED ORDER — LACTATED RINGERS IV SOLN
INTRAVENOUS | Status: DC
  Administered 2019-03-25: 21:00:00 via INTRAVENOUS

## 2019-03-25 MED ORDER — CEFAZOLIN SODIUM-DEXTROSE 2-4 GM/100ML-% IV SOLN
2.0000 g | INTRAVENOUS | Status: AC
  Administered 2019-03-25: 10:00:00 2 g via INTRAVENOUS

## 2019-03-25 MED ORDER — SOD CITRATE-CITRIC ACID 500-334 MG/5ML PO SOLN: 30.0000 mL | Freq: Once | ORAL | Status: DC

## 2019-03-25 MED ORDER — SODIUM CHLORIDE 0.9 % IV SOLN
INTRAVENOUS | Status: DC | PRN
  Administered 2019-03-25: 10:00:00 40 [IU] via INTRAVENOUS

## 2019-03-25 MED ORDER — COCONUT OIL OIL: 1.0000 "application " | TOPICAL_OIL | Status: DC | PRN

## 2019-03-25 MED ORDER — SCOPOLAMINE 1 MG/3DAYS TD PT72
MEDICATED_PATCH | TRANSDERMAL | Status: AC
  Filled 2019-03-25: qty 1

## 2019-03-25 MED ORDER — FENTANYL CITRATE (PF) 100 MCG/2ML IJ SOLN
INTRAMUSCULAR | Status: DC | PRN
  Administered 2019-03-25: 15 ug via INTRATHECAL

## 2019-03-25 MED ORDER — LEVONORGESTREL 19.5 MCG/DAY IU IUD
INTRAUTERINE_SYSTEM | INTRAUTERINE | Status: AC
  Filled 2019-03-25: qty 1

## 2019-03-25 MED ORDER — PRENATAL MULTIVITAMIN CH
1.0000 | ORAL_TABLET | Freq: Every day | ORAL | Status: DC
  Administered 2019-03-26 – 2019-03-27 (×2): 1 via ORAL
  Filled 2019-03-25 (×3): qty 1

## 2019-03-25 MED ORDER — SCOPOLAMINE 1 MG/3DAYS TD PT72
1.0000 | MEDICATED_PATCH | Freq: Once | TRANSDERMAL | Status: DC
  Administered 2019-03-25: 1.5 mg via TRANSDERMAL

## 2019-03-25 MED ORDER — MORPHINE SULFATE (PF) 0.5 MG/ML IJ SOLN
INTRAMUSCULAR | Status: AC
  Filled 2019-03-25: qty 10

## 2019-03-25 MED ORDER — MORPHINE SULFATE (PF) 0.5 MG/ML IJ SOLN
INTRAMUSCULAR | Status: DC | PRN
  Administered 2019-03-25: .15 mg via INTRATHECAL

## 2019-03-25 MED ORDER — OXYTOCIN 40 UNITS IN NORMAL SALINE INFUSION - SIMPLE MED: 2.5000 [IU]/h | INTRAVENOUS | Status: AC

## 2019-03-25 MED ORDER — LEVONORGESTREL 19.5 MCG/DAY IU IUD
INTRAUTERINE_SYSTEM | Freq: Once | INTRAUTERINE | Status: AC
  Administered 2019-03-25: 1 via INTRAUTERINE

## 2019-03-25 MED ORDER — CEFAZOLIN SODIUM-DEXTROSE 2-4 GM/100ML-% IV SOLN
INTRAVENOUS | Status: AC
  Filled 2019-03-25: qty 100

## 2019-03-25 MED ORDER — FENTANYL CITRATE (PF) 100 MCG/2ML IJ SOLN
INTRAMUSCULAR | Status: AC
  Filled 2019-03-25: qty 2

## 2019-03-25 MED ORDER — ONDANSETRON HCL 4 MG/2ML IJ SOLN: 4.0000 mg | Freq: Three times a day (TID) | INTRAMUSCULAR | Status: DC | PRN

## 2019-03-25 MED ORDER — ELVITEG-COBIC-EMTRICIT-TENOFAF 150-150-200-10 MG PO TABS
1.0000 | ORAL_TABLET | Freq: Every day | ORAL | Status: DC
  Administered 2019-03-26 – 2019-03-27 (×2): 1 via ORAL
  Filled 2019-03-25 (×4): qty 1

## 2019-03-25 MED ORDER — INFLUENZA VAC SPLIT QUAD 0.5 ML IM SUSY
0.5000 mL | PREFILLED_SYRINGE | INTRAMUSCULAR | Status: DC
  Filled 2019-03-25: qty 0.5

## 2019-03-25 MED ORDER — DEXAMETHASONE SODIUM PHOSPHATE 4 MG/ML IJ SOLN
INTRAMUSCULAR | Status: DC | PRN
  Administered 2019-03-25: 4 mg via INTRAVENOUS

## 2019-03-25 MED ORDER — POLYETHYLENE GLYCOL 3350 17 G PO PACK
17.0000 g | PACK | Freq: Every day | ORAL | Status: DC
  Administered 2019-03-25 – 2019-03-26 (×2): 17 g via ORAL
  Filled 2019-03-25 (×2): qty 1

## 2019-03-25 MED ORDER — IBUPROFEN 800 MG PO TABS
800.0000 mg | ORAL_TABLET | Freq: Four times a day (QID) | ORAL | Status: DC | PRN
  Administered 2019-03-26 – 2019-03-27 (×2): 800 mg via ORAL
  Filled 2019-03-25 (×3): qty 1

## 2019-03-25 MED ORDER — SODIUM CHLORIDE 0.9% FLUSH: 3.0000 mL | INTRAVENOUS | Status: DC | PRN

## 2019-03-25 MED ORDER — PHENYLEPHRINE 40 MCG/ML (10ML) SYRINGE FOR IV PUSH (FOR BLOOD PRESSURE SUPPORT)
PREFILLED_SYRINGE | INTRAVENOUS | Status: AC
  Filled 2019-03-25: qty 10

## 2019-03-25 MED ORDER — SODIUM CHLORIDE 0.9 % IV SOLN
INTRAVENOUS | Status: DC | PRN
  Administered 2019-03-25: 10:00:00 via INTRAVENOUS

## 2019-03-25 MED ORDER — PHENYLEPHRINE 40 MCG/ML (10ML) SYRINGE FOR IV PUSH (FOR BLOOD PRESSURE SUPPORT)
PREFILLED_SYRINGE | INTRAVENOUS | Status: DC | PRN
  Administered 2019-03-25 (×4): 80 ug via INTRAVENOUS

## 2019-03-25 MED ORDER — BUPIVACAINE IN DEXTROSE 0.75-8.25 % IT SOLN
INTRATHECAL | Status: AC
  Filled 2019-03-25: qty 2

## 2019-03-25 MED ORDER — DIPHENHYDRAMINE HCL 50 MG/ML IJ SOLN
INTRAMUSCULAR | Status: AC
  Filled 2019-03-25: qty 1

## 2019-03-25 MED ORDER — ENOXAPARIN SODIUM 40 MG/0.4ML ~~LOC~~ SOLN
40.0000 mg | SUBCUTANEOUS | Status: DC
  Administered 2019-03-26 – 2019-03-27 (×2): 40 mg via SUBCUTANEOUS
  Filled 2019-03-25 (×2): qty 0.4

## 2019-03-25 MED ORDER — WITCH HAZEL-GLYCERIN EX PADS: 1.0000 "application " | MEDICATED_PAD | CUTANEOUS | Status: DC | PRN

## 2019-03-25 MED ORDER — BUPIVACAINE IN DEXTROSE 0.75-8.25 % IT SOLN
INTRATHECAL | Status: DC | PRN
  Administered 2019-03-25: 12.5 mg via INTRATHECAL

## 2019-03-25 MED ORDER — DIPHENHYDRAMINE HCL 50 MG/ML IJ SOLN
12.5000 mg | INTRAMUSCULAR | Status: DC | PRN
  Administered 2019-03-25 (×2): 12.5 mg via INTRAVENOUS
  Filled 2019-03-25: qty 1

## 2019-03-25 MED ORDER — DIPHENHYDRAMINE HCL 25 MG PO CAPS
25.0000 mg | ORAL_CAPSULE | ORAL | Status: DC | PRN
  Administered 2019-03-26 (×2): 25 mg via ORAL
  Filled 2019-03-25 (×2): qty 1

## 2019-03-25 MED ORDER — ZIDOVUDINE 10 MG/ML IV SOLN
1.0000 mg/kg/h | INTRAVENOUS | Status: DC
  Administered 2019-03-25: 10:00:00 1 mg/kg/h via INTRAVENOUS
  Filled 2019-03-25: qty 40

## 2019-03-25 MED ORDER — ONDANSETRON HCL 4 MG/2ML IJ SOLN
INTRAMUSCULAR | Status: AC
  Filled 2019-03-25: qty 2

## 2019-03-25 MED ORDER — ACETAMINOPHEN 325 MG PO TABS
650.0000 mg | ORAL_TABLET | Freq: Four times a day (QID) | ORAL | Status: DC
  Administered 2019-03-25 – 2019-03-27 (×5): 650 mg via ORAL
  Filled 2019-03-25 (×8): qty 2

## 2019-03-25 MED ORDER — SOD CITRATE-CITRIC ACID 500-334 MG/5ML PO SOLN
30.0000 mL | ORAL | Status: AC
  Administered 2019-03-25: 30 mL via ORAL

## 2019-03-25 MED ORDER — NALOXONE HCL 4 MG/10ML IJ SOLN
1.0000 ug/kg/h | INTRAVENOUS | Status: DC | PRN
  Filled 2019-03-25: qty 5

## 2019-03-25 SURGICAL SUPPLY — 35 items
BENZOIN TINCTURE PRP APPL 2/3 (GAUZE/BANDAGES/DRESSINGS) ×3 IMPLANT
CANISTER SUCT 3000ML PPV (MISCELLANEOUS) ×3 IMPLANT
CHLORAPREP W/TINT 26ML (MISCELLANEOUS) ×3 IMPLANT
CLOSURE STERI STRIP 1/2 X4 (GAUZE/BANDAGES/DRESSINGS) ×2 IMPLANT
CLOSURE WOUND 1/2 X4 (GAUZE/BANDAGES/DRESSINGS) ×1
DRSG OPSITE POSTOP 4X10 (GAUZE/BANDAGES/DRESSINGS) ×3 IMPLANT
ELECT REM PT RETURN 9FT ADLT (ELECTROSURGICAL) ×3
ELECTRODE REM PT RTRN 9FT ADLT (ELECTROSURGICAL) ×1 IMPLANT
EXTRACTOR VACUUM KIWI (MISCELLANEOUS) ×3 IMPLANT
GLOVE BIOGEL PI IND STRL 7.0 (GLOVE) ×2 IMPLANT
GLOVE BIOGEL PI IND STRL 7.5 (GLOVE) ×1 IMPLANT
GLOVE BIOGEL PI INDICATOR 7.0 (GLOVE) ×4
GLOVE BIOGEL PI INDICATOR 7.5 (GLOVE) ×2
GLOVE SKINSENSE NS SZ7.0 (GLOVE) ×2
GLOVE SKINSENSE STRL SZ7.0 (GLOVE) ×1 IMPLANT
GOWN STRL REUS W/ TWL LRG LVL3 (GOWN DISPOSABLE) ×2 IMPLANT
GOWN STRL REUS W/ TWL XL LVL3 (GOWN DISPOSABLE) ×1 IMPLANT
GOWN STRL REUS W/TWL LRG LVL3 (GOWN DISPOSABLE) ×4
GOWN STRL REUS W/TWL XL LVL3 (GOWN DISPOSABLE) ×2
NS IRRIG 1000ML POUR BTL (IV SOLUTION) ×3 IMPLANT
PACK C SECTION WH (CUSTOM PROCEDURE TRAY) ×3 IMPLANT
PAD ABD 7.5X8 STRL (GAUZE/BANDAGES/DRESSINGS) ×3 IMPLANT
PAD OB MATERNITY 4.3X12.25 (PERSONAL CARE ITEMS) ×3 IMPLANT
PAD PREP 24X48 CUFFED NSTRL (MISCELLANEOUS) ×3 IMPLANT
PENCIL SMOKE EVAC W/HOLSTER (ELECTROSURGICAL) ×3 IMPLANT
STRIP CLOSURE SKIN 1/2X4 (GAUZE/BANDAGES/DRESSINGS) ×2 IMPLANT
SUT MNCRL 0 VIOLET CTX 36 (SUTURE) ×2 IMPLANT
SUT MON AB 4-0 PS1 27 (SUTURE) ×3 IMPLANT
SUT MONOCRYL 0 CTX 36 (SUTURE) ×4
SUT PLAIN 2 0 XLH (SUTURE) ×3 IMPLANT
SUT VIC AB 0 CT1 36 (SUTURE) ×6 IMPLANT
SUT VIC AB 3-0 CT1 27 (SUTURE) ×2
SUT VIC AB 3-0 CT1 TAPERPNT 27 (SUTURE) ×1 IMPLANT
TOWEL OR 17X24 6PK STRL BLUE (TOWEL DISPOSABLE) ×6 IMPLANT
WATER STERILE IRR 1000ML POUR (IV SOLUTION) ×3 IMPLANT

## 2019-03-25 NOTE — Op Note (Signed)
Operative Note   SURGERY DATE: 03/25/2019  PRE-OP DIAGNOSIS:  *Pregnancy at 39/0 *History of cesarean section x 4 *HIV (viral load undetectable) *Desire for Liletta IUD  POST-OP DIAGNOSIS: Same. Delivered   PROCEDURE: Repeat low transverse cesarean section via pfannenstiel skin incision with double layer uterine closure and Liletta IUD placement  SURGEON: Surgeon(s) and Role:    * Ricardo Kayes, Eduard Clos, MD - Primary  ASSISTANT: None  ANESTHESIA: CSE  ESTIMATED BLOOD LOSS: 174mL  DRAINS: 157mL UOP via indwelling foley  TOTAL IV FLUIDS: 2361mL crystalloid  VTE PROPHYLAXIS: SCDs to bilateral lower extremities  ANTIBIOTICS: Two grams of Cefazolin were given., within 1 hour of skin incision  SPECIMENS: placenta to pathology  COMPLICATIONS: none  FINDINGS: Of note, there were no intra-abdominal adhesions, as was seen at her last c-section. Grossly normal uterus, tubes and ovaries. Clear amniotic fluid, cephalic, female infant, weight 3846gm, APGARs 8/8, intact placenta.  PROCEDURE IN DETAIL: The patient was taken to the operating room where anesthesia was administered and normal fetal heart tones were confirmed. She was then prepped and draped in the normal fashion in the dorsal supine position with a leftward tilt.  After a time out was performed, a pfannensteil skin incision was made above the prior one because the prior one was at the crease of the belly with the scalpel and carried through to the underlying layer of fascia. The fascia was then incised at the midline and this incision was extended laterally with the mayo scissors. Attention was turned to the superior aspect of the fascial incision which was grasped with the kocher clamps x 2, tented up and the rectus muscles were dissected off with the scalpel. In a similar fashion the inferior aspect of the fascial incision was grasped with the kocher clamps, tented up and the rectus muscles dissected off with the mayo scissors. The  rectus muscles were then separated in the midline and the peritoneum was entered bluntly. The bladder blade was inserted and the vesicouterine peritoneum was identified, tented up and entered with the metzenbaum scissors. This incision was extended laterally and the bladder flap was created digitally. The bladder blade was reinserted.  A low transverse hysterotomy was made with the scalpel until the endometrial cavity was breached and the amniotic sac ruptured with the Allis clamp, yielding clear amniotic fluid. This incision was extended bluntly and the infant's head, shoulders and body were delivered atraumatically.The cord was clamped x 2 and cut, and the infant was handed to the awaiting pediatricians, after delayed cord clamping was done.  The placenta was then gradually expressed from the uterus and then the uterus was exteriorized and cleared of all clots and debris. The new, sterile gloves were donned and the IUD was removed from the device and placed at the fundal position; it was then held there and the strings teased and place down through the cervix. The hysterotomy was repaired with a running suture of 1-0 monocryl. A second imbricating layer of 1-0 monocryl suture was then placed to achieve excellent hemostasis.   The uterus and adnexa were then returned to the abdomen, and the hysterotomy and all operative sites were reinspected and excellent hemostasis was noted after irrigation and suction of the abdomen with warm saline.  The peritoneum was closed with a running stitch of 3-0 Vicryl. The fascia was reapproximated with 0 Vicryl in a simple running fashion bilaterally. The subcutaneous layer was then reapproximated with interrupted sutures of 2-0 plain gut, and the skin was then closed with  4-0 monocryl, in a subcuticular fashion.  The patient  tolerated the procedure well. Sponge, lap, needle, and instrument counts were correct x 2. The patient was transferred to the recovery room awake,  alert and breathing independently in stable condition.  Cornelia Copaharlie Tareek Sabo, Jr. MD Attending Center for [facility](Faculty Practice)

## 2019-03-25 NOTE — H&P (Signed)
Obstetrics Admission History & Physical  03/25/2019 - 8:29 AM Primary OBGYN: [facility]  Chief Complaint: scheduled rpt c/s and Liletta IUD placement  History of Present Illness  38 y.o. [id number] @ 18w0dwith the above CC. Pregnancy complicated by: HIV (VL undetectable last week), h/o prior c-section x 4, AMA  [name] states that she has no s/s of labor or decreased FM  Review of Systems:  as noted in the History of Present Illness.  Patient Active Problem List   Diagnosis Date Noted  . History of cesarean delivery 03/25/2019  . Group B Streptococcus carrier, antepartum 03/13/2019  . Maternal iron deficiency anemia affecting pregnancy in third trimester, antepartum 01/07/2019  . Obesity in pregnancy 12/15/2018  . UTI in pregnancy 11/15/2018  . Supervision of high risk pregnancy, antepartum 11/10/2018  . Language barrier 11/10/2018  . 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 Umbilical hernia 09/81/1914  . 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 AMA (advanced maternal age) multigravida 35+ 02/21/2016  . Previous cesarean delivery affecting pregnancy, antepartum 02/21/2016  . Down syndrome in child of prior pregnancy, currently pregnant 02/21/2016     PMHx:  Past Medical History:  Diagnosis Date  . HIV (human immunodeficiency virus infection) ([place])    PSHx:  Past Surgical History:  Procedure Laterality Date  . CESAREAN SECTION     C/S x 4  . CESAREAN SECTION N/A 06/22/2016   Procedure: CESAREAN SECTION;  Surgeon: Florian Buff, MD;  Location: [facility];  Service: Obstetrics;  Laterality: N/A;  . CIRCUMCISION     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   Medications:  Medications Prior to Admission  Medication Sig Dispense Refill Last Dose  . acetaminophen (TYLENOL) 325 MG tablet Take 2 tablets (650 mg total) by mouth every 4 (four) hours as needed (for pain  scale < 4). (Patient not taking: Reported on 03/17/2019) 30 tablet 0   . aspirin EC 81 MG tablet Take 1 tablet (81 mg total) by mouth daily. (Patient not taking: Reported on 01/07/2019) 30 tablet 4   . darunavir (PREZISTA) 800 MG tablet Take 1 tablet (800 mg total) by mouth daily with breakfast. 30 tablet 11   . ferrous sulfate (FERROUSUL) 325 (65 FE) MG tablet Take 1 tablet (325 mg total) by mouth 2 (two) times daily. 60 tablet 1   . folic acid (FOLVITE) 1 MG tablet Take 1 tablet (1 mg total) by mouth daily. 100 tablet 1   . GENVOYA 150-150-200-10 MG TABS tablet Take 1 tablet by mouth daily with breakfast. 30 tablet 11   . glycopyrrolate (ROBINUL) 2 MG tablet Take 1 tablet (2 mg total) by mouth 3 (three) times daily as needed. (Patient not taking: Reported on 03/17/2019) 30 tablet 3   . hydrocortisone (ANUSOL-HC) 25 MG suppository Place 1 suppository (25 mg total) rectally 2 (two) times daily. (Patient not taking: Reported on 03/17/2019) 12 suppository 1   . NORVIR 100 MG TABS tablet TAKE 1 TABLET BY MOUTH DAILY WITH BREAKFAST 30 tablet 2   . ondansetron (ZOFRAN) 4 MG tablet Take 1 tablet (4 mg total) by mouth every 8 (eight) hours as needed for nausea or vomiting. 20 tablet 1   . pramoxine (PROCTOFOAM) 1 % foam Place 1 application rectally 3 (three) times daily as needed for anal itching. (Patient not taking: Reported on 03/17/2019) 15 g 2   . Prenatal Multivit-Min-Fe-FA (PRENATAL VITAMINS) 0.8 MG  tablet Take 1 tablet by mouth daily. 100 tablet 2   . TRUVADA 200-300 MG tablet TAKE 1 TABLET BY MOUTH DAILY 30 tablet 2      Allergies: is allergic to chloroquine. OBHx:  OB History  Gravida Para Term Preterm AB Living  6 4 4  0 1 4  SAB TAB Ectopic Multiple Live Births  1 0 0 0 4    # Outcome Date GA Lbr Len/2nd Weight Sex Delivery Anes PTL Lv  6 Current           5 Term 06/22/16 4214w1d  2850 g M CS-LTranv Spinal  LIV  4 Term 04/05/15 2523w2d  3000 g F CS-Unspec        Birth Comments: had a problem with  abdomen during pregnancy, baby born with undiagnosed down's syndrome  3 Term 04/15/13 4875w0d  2900 g F CS-Unspec        Birth Comments: no complications  2 Term 10/21/08 9136w0d  3400 g M CS-Unspec        Birth Comments: no complications except c/s for ftp, and bleeding, trisomy 21  1 SAB 2009                      FHx:  Family History  Problem Relation Age of Onset  . Arthritis Mother   . Down syndrome Daughter   . Asthma Neg Hx   . Alcohol abuse Neg Hx   . Birth defects Neg Hx   . Cancer Neg Hx   . COPD Neg Hx   . Depression Neg Hx   . Diabetes Neg Hx   . Drug abuse Neg Hx   . Early death Neg Hx   . Hearing loss Neg Hx   . Heart disease Neg Hx   . Hyperlipidemia Neg Hx   . Hypertension Neg Hx   . Kidney disease Neg Hx   . Learning disabilities Neg Hx   . Mental illness Neg Hx   . Mental retardation Neg Hx   . Miscarriages / Stillbirths Neg Hx   . Stroke Neg Hx   . Vision loss Neg Hx   . Varicose Veins Neg Hx    Soc Hx:  Social History   Socioeconomic History  . Marital status: Married    Spouse name: Not on file  . Number of children: Not on file  . Years of education: Not on file  . Highest education level: Not on file  Occupational History  . Not on file  Social Needs  . Financial resource strain: Not on file  . Food insecurity    Worry: Not on file    Inability: Not on file  . Transportation needs    Medical: Not on file    Non-medical: Not on file  Tobacco Use  . Smoking status: Never Smoker  . Smokeless tobacco: Never Used  Substance and Sexual Activity  . Alcohol use: No  . Drug use: No  . Sexual activity: Yes  Lifestyle  . Physical activity    Days per week: Not on file    Minutes per session: Not on file  . Stress: Not on file  Relationships  . Social Musicianconnections    Talks on phone: Not on file    Gets together: Not on file    Attends religious service: Not on file    Active member of club or organization: Not on file    Attends meetings of  clubs or organizations: Not on  file    Relationship status: Not on file  . Intimate partner violence    Fear of current or ex partner: Not on file    Emotionally abused: Not on file    Physically abused: Not on file    Forced sexual activity: Not on file  Other Topics Concern  . Not on file  Social History Narrative  . Not on file    Objective    Current Vital Signs 24h Vital Sign Ranges  T 97.9 F (36.6 C) Temp  Avg: 97.9 F (36.6 C)  Min: 97.9 F (36.6 C)  Max: 97.9 F (36.6 C)  BP 114/72 BP  Min: 114/72  Max: 114/72  HR 81 Pulse  Avg: 81  Min: 81  Max: 81  RR 18 Resp  Avg: 18  Min: 18  Max: 18  SaO2 96 %   SpO2  Avg: 96 %  Min: 96 %  Max: 96 %       24 Hour I/O Current Shift I/O  Time Ins Outs No intake/output data recorded. No intake/output data recorded.   EFM: 125   General: Well nourished, well developed female in no acute distress.  Skin:  Warm and dry.  Cardiovascular: S1, S2 normal, no murmur, rub or gallop, regular rate and rhythm Respiratory:  Clear to auscultation bilateral. Normal respiratory effort Abdomen: gravid, nttp Neuro/Psych:  Normal mood and affect.    Labs  As per HPI Conflict (See Lab Report): O POS/O POS Performed at [facility], [address], [place], [place] [address] COVID negative.  RPR pending  Recent Labs  Lab 03/23/19 0850 03/25/19 0708  WBC 10.0 9.8  HGB 9.7* 9.7*  HCT 28.9* 29.1*  PLT 257 269    Radiology Posterior placenta  Perinatal info  Conflict (See Lab Report): O POS/O POS Performed at [facility], [address] [place], [place] [address] / Ishmael Holter  Immune Rande Brunt pending/HIV positive/HepB Surf Ag negative/TDaP:yes /pap neg and hpv neg 2020/   Assessment & Plan   38 y.o. [id number] @ [redacted]w[redacted]d here for scheduled c/s and Liletta IUD placement. Doing well *Pregnancy: routine care. Fetal status reassuring *Delivery: can proceed with OR when they are ready. No scar tissue noted on last op note *HIV:  Per ID note on 9/3, plan to transition to [facility] after delivery. AZT infusing per peds recommenation *AMA: declined genetics. Normal anatomy u/s  Cornelia Copa MD Attending Center for [facility])

## 2019-03-25 NOTE — Anesthesia Procedure Notes (Signed)
Spinal  Patient location during procedure: OR Start time: 03/25/2019 9:51 AM Staffing Anesthesiologist: Josephine Igo, MD Performed: anesthesiologist  Preanesthetic Checklist Completed: patient identified, site marked, surgical consent, pre-op evaluation, timeout performed, IV checked, risks and benefits discussed and monitors and equipment checked Spinal Block Patient position: sitting Prep: site prepped and draped and DuraPrep Patient monitoring: cardiac monitor, continuous pulse ox, blood pressure and heart rate Approach: midline Location: L3-4 Injection technique: catheter Needle Needle type: Tuohy and Pencan  Needle gauge: 24 G Needle length: 12.7 cm Needle insertion depth: 6 cm Catheter type: closed end flexible Catheter size: 19 g Catheter at skin depth: 11 cm Additional Notes Epidural performed using LOR with air technique. No CSF, Heme or paresthesias. SAB performed through the epidural needle using 24ga Spinocan needle. CSF clear with free flow and no paresthesias. Local anesthetic and narcotics injected through the spinal needle and withdrawn. Epidural catheter threaded 5cm into the epidural space and the epidural needle was withdrawn. A sterile dressing was applied and the patient placed supine with LUD. The patient tolerated the procedure well and adequate sensory level was obtained.

## 2019-03-25 NOTE — Transfer of Care (Signed)
Immediate Anesthesia Transfer of Care Note  Patient: Terri Padilla  Procedure(s) Performed: CESAREAN SECTION AND IUD INSERTION (N/A )  Patient Location: PACU  Anesthesia Type:Spinal  Level of Consciousness: awake  Airway & Oxygen Therapy: Patient Spontanous Breathing  Post-op Assessment: Report given to RN  Post vital signs: Reviewed and stable  Last Vitals:  Vitals Value Taken Time  BP 117/71 03/25/19 1610  Temp 36.7 C 03/25/19 1610  Pulse 71 03/25/19 1610  Resp 16 03/25/19 1610  SpO2 100 % 03/25/19 1338    Last Pain:  Vitals:   03/25/19 1610  TempSrc: Oral  PainSc:          Complications: No apparent anesthesia complications

## 2019-03-25 NOTE — Anesthesia Postprocedure Evaluation (Signed)
Anesthesia Post Note  Patient: [name]  Procedure(s) Performed: CESAREAN SECTION AND IUD INSERTION (N/A )     Patient location during evaluation: PACU Anesthesia Type: Spinal Level of consciousness: oriented and awake and alert Pain management: pain level controlled Vital Signs Assessment: post-procedure vital signs reviewed and stable Respiratory status: spontaneous breathing, respiratory function stable and nonlabored ventilation Cardiovascular status: blood pressure returned to baseline and stable Postop Assessment: no headache, no backache, no apparent nausea or vomiting, spinal receding and patient able to bend at knees Anesthetic complications: no    Pain Goal:                   Terri Padilla A.

## 2019-03-25 NOTE — Discharge Summary (Signed)
error 

## 2019-03-25 NOTE — Discharge Summary (Signed)
Postpartum Discharge Summary    Patient Name: Terri Padilla DOB: 1980-11-17 MRN: [id number]  Date of admission: 03/25/2019 Delivering Provider: Aletha Halim   Date of discharge: 03/27/2019  Admitting diagnosis: Scheduled repeat c-section Intrauterine pregnancy: [redacted]w[redacted]d    Secondary diagnosis:  HIV+, AMA     Discharge diagnosis: Term Pregnancy Delivered, HIV+, AMA, rC/S, obesity, anemia, GBS+                                                                                      Post partum procedures:none  Augmentation: N/A  Complications: None  Hospital course:  Sceduled C/S   38y.o. yo [id number] 345w0das admitted to the hospital 03/25/2019 for scheduled rpt cesarean section and Liletta IUD insertion.   Details of operation can be found in separate operative note.  Pateint had an uncomplicated postpartum course.  She is ambulating, tolerating a regular diet, passing flatus, and urinating well. Patient is discharged home in stable condition on  03/27/19   She has been put on Genvoya and Prezista per ID recommendations and they will arrange routine PP follow up for her.         Delivery time: 10:26 AM    Magnesium Sulfate received: No BMZ received: No Rhophylac:No MMR:No Transfusion:No  Physical exam  Vitals:   03/26/19 0502 03/26/19 1420 03/26/19 2153 03/27/19 0555  BP: 115/64 (!) 100/57 (!) 112/50 112/71  Pulse: 68 72 80 (!) 58  Resp: '16 20  18  '$ Temp: 98.4 F (36.9 C) 98.3 F (36.8 C)  98.7 F (37.1 C)  TempSrc: Oral Oral  Oral  SpO2:  98% 99%   Weight:      Height:       General: alert, cooperative and no distress Lochia: appropriate Uterine Fundus: firm Incision: Healing well with no significant drainage, Dressing is clean and dry, but was not intact. Order placed for RN to replace dressing prior to discharge. DVT Evaluation: No evidence of DVT seen on physical exam. No cords or calf tenderness. No significant calf/ankle edema. Labs: Lab  Results  Component Value Date   WBC 17.7 (H) 03/26/2019   HGB 9.9 (L) 03/26/2019   HCT 30.1 (L) 03/26/2019   MCV 82.5 03/26/2019   PLT 302 03/26/2019   CMP Latest Ref Rng & Units 03/26/2019  Glucose 65 - 99 mg/dL -  BUN 7 - 25 mg/dL -  Creatinine 0.44 - 1.00 mg/dL 0.65  Sodium 135 - 146 mmol/L -  Potassium 3.5 - 5.3 mmol/L -  Chloride 98 - 110 mmol/L -  CO2 20 - 32 mmol/L -  Calcium 8.6 - 10.2 mg/dL -  Total Protein 6.1 - 8.1 g/dL -  Total Bilirubin 0.2 - 1.2 mg/dL -  Alkaline Phos 39 - 117 IU/L -  AST 10 - 30 U/L -  ALT 6 - 29 U/L -    Discharge instruction: per After Visit Summary and "Baby and Me Booklet".  After visit meds:  Allergies as of 03/27/2019      Reactions   Chloroquine Itching   Per Interpreter: Nivaquine (Chloroquine)      Medication List    STOP taking  these medications   acetaminophen 325 MG tablet Commonly known as: Tylenol   ferrous sulfate 325 (65 FE) MG tablet Commonly known as: FerrouSul   folic acid 1 MG tablet Commonly known as: FOLVITE   glycopyrrolate 2 MG tablet Commonly known as: ROBINUL   Norvir 100 MG Tabs tablet Generic drug: ritonavir   ondansetron 4 MG tablet Commonly known as: Zofran   Prenatal Vitamins 0.8 MG tablet   Truvada 200-300 MG tablet Generic drug: emtricitabine-tenofovir     TAKE these medications   aspirin EC 81 MG tablet Take 1 tablet (81 mg total) by mouth daily.   darunavir 800 MG tablet Commonly known as: Prezista Take 1 tablet (800 mg total) by mouth daily with breakfast.   Genvoya 150-150-200-10 MG Tabs tablet Generic drug: elvitegravir-cobicistat-emtricitabine-tenofovir Take 1 tablet by mouth daily with breakfast.   hydrocortisone 25 MG suppository Commonly known as: ANUSOL-HC Place 1 suppository (25 mg total) rectally 2 (two) times daily.   ibuprofen 600 MG tablet Commonly known as: ADVIL Take 1 tablet (600 mg total) by mouth every 6 (six) hours as needed for mild pain or cramping.    pramoxine 1 % foam Commonly known as: Proctofoam Place 1 application rectally 3 (three) times daily as needed for anal itching.   traMADol 50 MG tablet Commonly known as: Ultram Take 1 tablet (50 mg total) by mouth every 6 (six) hours as needed for up to 10 doses.       Diet: routine diet  Activity: Advance as tolerated. Pelvic rest for 6 weeks.   Outpatient follow up:1 week Follow up Appt: Future Appointments  Date Time Provider [place]  04/04/2019 10:30 AM Carlyle Basques, MD [facility]   Follow up Visit: [place] [facility] Follow up.   Specialty: Obstetrics and Gynecology Why: 1wk for incision check 6wks for postpartum Contact information: [address] [place], [place] [id number] [place] [address] [phone]           Please schedule this patient for Postpartum visit in: 1 wk for incision check and 6 weeks with the following provider: MD For C/S patients schedule nurse incision check in 1 weeks: yes High risk pregnancy complicated by: HIV+, AMA, rC/S, obesity, anemia, GBS+ Delivery mode:  CS, repeat Anticipated Birth Control:  IUD, placed immediately postpartum PP Procedures needed: Incision check  Schedule Integrated BH visit: no  -smart text was not completed on PP message. Text completed, unable to verify that this message had been sent, so message was sent to [facility] clinic 03/27/2019.  Newborn Data: Live born female  Birth Weight: 8 lb 7.6 oz (3845 g) APGAR: 8, 8  Newborn Delivery   Birth date/time: 03/25/2019 10:26:00 Delivery type: C-Section, Low Transverse Trial of labor: No C-section categorization: Repeat      Baby Feeding: formula Disposition:home with mother  -pt declined interpreter for visit -due to interactions with HIV medications, consulted with Dr. Glo Herring on appropriate pain medications to be prescribed at  discharge  03/27/2019 Clarisa Fling, NP

## 2019-03-26 LAB — CBC
HCT: 30.1 % — ABNORMAL LOW (ref 36.0–46.0)
Hemoglobin: 9.9 g/dL — ABNORMAL LOW (ref 12.0–15.0)
MCH: 27.1 pg (ref 26.0–34.0)
MCHC: 32.9 g/dL (ref 30.0–36.0)
MCV: 82.5 fL (ref 80.0–100.0)
Platelets: 302 10*3/uL (ref 150–400)
RBC: 3.65 MIL/uL — ABNORMAL LOW (ref 3.87–5.11)
RDW: 15.2 % (ref 11.5–15.5)
WBC: 17.7 10*3/uL — ABNORMAL HIGH (ref 4.0–10.5)
nRBC: 0 % (ref 0.0–0.2)

## 2019-03-26 LAB — CREATININE, SERUM
Creatinine, Ser: 0.65 mg/dL (ref 0.44–1.00)
GFR calc Af Amer: >60 mL/min (ref >60)
GFR calc non Af Amer: >60 mL/min (ref >60)

## 2019-03-26 NOTE — Progress Notes (Signed)
CSW aware of consult and met with MOB and FOB at bedside to discuss Pediatric Infectious Disease follow up appointment. CSW aware MOB's chart notes that MOB's preferred language is French but MOB declined interpreter during visit. MOB and FOB both welcoming of CSW visit and were pleasant and engaged throughout interaction.  CSW aware that previous child went to the Pediatric Infectious Disease Clinic at [facility] following birth. CSW inquired about if this is where they would like infant to go for follow up care to which MOB declined as they now live in [place] and [place] is too far. CSW provided other options in where infant's ID Clinic follow up appointment could be scheduled and MOB and FOB stated their preference was [facility]. CSW aware that it is the weekend and their office does not open until Monday. CSW explained that if MOB and infant are discharged prior to Monday that CSW would follow up with appointment date and time. MOB and FOB agreeable to this. CSW to follow up with [facility] on Monday morning to schedule follow up appointment.  Prior to CSW's departure, FOB asked about MOB's Medicaid. Per FOB, they applied last week to get MOB Medicaid and FOB was unsure if it had been approved. CSW looked in system and noted that it has not been updated with our system. CSW explained process of getting Medicaid approved may take a few weeks but that it would likely back date to cover hospital stay once approved. CSW encouraged MOB and FOB to follow up with Medicaid caseworker to verify this. FOB expressed understanding. MOB and FOB denied any further questions or concerns for CSW, at this time.   Fabio Wah, LCSWA  [facility] [phone]   

## 2019-03-26 NOTE — Progress Notes (Signed)
Post Partum Day 1  Subjective: no complaints, up ad lib, voiding, tolerating PO, + flatus and pt reports she is feeling well.  Objective: Blood pressure 115/64, pulse 68, temperature 98.4 F (36.9 C), temperature source Oral, resp. rate 16, height 5\' 6"  (1.676 m), weight 93.5 kg, last menstrual period 07/08/2018, SpO2 100 %.  Physical Exam:  General: alert, cooperative and no distress Lochia: appropriate Uterine Fundus: firm Incision: healing well, no significant drainage, no dehiscence, no significant erythema DVT Evaluation: No evidence of DVT seen on physical exam. No cords or calf tenderness. No significant calf/ankle edema.  Recent Labs    03/25/19 0708 03/26/19 0532  HGB 9.7* 9.9*  HCT 29.1* 30.1*    Assessment/Plan: Plan for discharge tomorrow   LOS: 1 day   Gerrie Nordmann Nugent 03/26/2019, 12:25 PM

## 2019-03-27 ENCOUNTER — Encounter (HOSPITAL_COMMUNITY): Payer: Self-pay | Admitting: *Deleted

## 2019-03-27 MED ORDER — IBUPROFEN 600 MG PO TABS: 600.0000 mg | ORAL_TABLET | Freq: Four times a day (QID) | ORAL | 0 refills | Status: DC | PRN

## 2019-03-27 MED ORDER — TRAMADOL HCL 50 MG PO TABS: 50.0000 mg | ORAL_TABLET | Freq: Four times a day (QID) | ORAL | 0 refills | Status: DC | PRN

## 2019-03-27 NOTE — Discharge Instructions (Signed)
No lifting greater than 10 pounds. No driving for 4 weeks. Remove dressing on Day 6.   Postpartum Care After Cesarean Delivery This sheet gives you information about how to care for yourself from the time you deliver your baby to up to 6-12 weeks after delivery (postpartum period). Your health care provider may also give you more specific instructions. If you have problems or questions, contact your health care provider. Follow these instructions at home: Medicines  Take over-the-counter and prescription medicines only as told by your health care provider.  If you were prescribed an antibiotic medicine, take it as told by your health care provider. Do not stop taking the antibiotic even if you start to feel better.  Ask your health care provider if the medicine prescribed to you: ? Requires you to avoid driving or using heavy machinery. ? Can cause constipation. You may need to take actions to prevent or treat constipation, such as:  Drink enough fluid to keep your urine pale yellow.  Take over-the-counter or prescription medicines.  Eat foods that are high in fiber, such as beans, whole grains, and fresh fruits and vegetables.  Limit foods that are high in fat and processed sugars, such as fried or sweet foods. Activity  Gradually return to your normal activities as told by your health care provider.  Avoid activities that take a lot of effort and energy (are strenuous) until approved by your health care provider. Walking at a slow to moderate pace is usually safe. Ask your health care provider what activities are safe for you. ? Do not lift anything that is heavier than your baby or 10 lb (4.5 kg) as told by your health care provider. ? Do not vacuum, climb stairs, or drive a car for as long as told by your health care provider.  If possible, have someone help you at home until you are able to do your usual activities yourself.  Rest as much as possible. Try to rest or take naps  while your baby is sleeping. Vaginal bleeding  It is normal to have vaginal bleeding (lochia) after delivery. Wear a sanitary pad to absorb vaginal bleeding and discharge. ? During the first week after delivery, the amount and appearance of lochia is often similar to a menstrual period. ? Over the next few weeks, it will gradually decrease to a dry, yellow-brown discharge. ? For most women, lochia stops completely by 4-6 weeks after delivery. Vaginal bleeding can vary from woman to woman.  Change your sanitary pads frequently. Watch for any changes in your flow, such as: ? A sudden increase in volume. ? A change in color. ? Large blood clots.  If you pass a blood clot, save it and call your health care provider to discuss. Do not flush blood clots down the toilet before you get instructions from your health care provider.  Do not use tampons or douches until your health care provider says this is safe.  If you are not breastfeeding, your period should return 6-8 weeks after delivery. If you are breastfeeding, your period may return anytime between 8 weeks after delivery and the time that you stop breastfeeding. Perineal care   If your C-section (Cesarean section) was unplanned, and you were allowed to labor and push before delivery, you may have pain, swelling, and discomfort of the tissue between your vaginal opening and your anus (perineum). You may also have an incision in the tissue (episiotomy) or the tissue may have torn during delivery. Follow these   instructions as told by your health care provider: ? Keep your perineum clean and dry as told by your health care provider. Use medicated pads and pain-relieving sprays and creams as directed. ? If you have an episiotomy or vaginal tear, check the area every day for signs of infection. Check for:  Redness, swelling, or pain.  Fluid or blood.  Warmth.  Pus or a bad smell. ? You may be given a squirt bottle to use instead of wiping to  clean the perineum area after you go to the bathroom. As you start healing, you may use the squirt bottle before wiping yourself. Make sure to wipe gently. ? To relieve pain caused by an episiotomy, vaginal tear, or hemorrhoids, try taking a warm sitz bath 2-3 times a day. A sitz bath is a warm water bath that is taken while you are sitting down. The water should only come up to your hips and should cover your buttocks. Breast care  Within the first few days after delivery, your breasts may feel heavy, full, and uncomfortable (breast engorgement). You may also have milk leaking from your breasts. Your health care provider can suggest ways to help relieve breast discomfort. Breast engorgement should go away within a few days.  If you are breastfeeding: ? Wear a bra that supports your breasts and fits you well. ? Keep your nipples clean and dry. Apply creams and ointments as told by your health care provider. ? You may need to use breast pads to absorb milk leakage. ? You may have uterine contractions every time you breastfeed for several weeks after delivery. Uterine contractions help your uterus return to its normal size. ? If you have any problems with breastfeeding, work with your health care provider or a lactation consultant.  If you are not breastfeeding: ? Avoid touching your breasts as this can make your breasts produce more milk. ? Wear a well-fitting bra and use cold packs to help with swelling. ? Do not squeeze out (express) milk. This causes you to make more milk. Intimacy and sexuality  Ask your health care provider when you can engage in sexual activity. This may depend on your: ? Risk of infection. ? Healing rate. ? Comfort and desire to engage in sexual activity.  You are able to get pregnant after delivery, even if you have not had your period. If desired, talk with your health care provider about methods of family planning or birth control (contraception). Lifestyle  Do  not use any products that contain nicotine or tobacco, such as cigarettes, e-cigarettes, and chewing tobacco. If you need help quitting, ask your health care provider.  Do not drink alcohol, especially if you are breastfeeding. Eating and drinking   Drink enough fluid to keep your urine pale yellow.  Eat high-fiber foods every day. These may help prevent or relieve constipation. High-fiber foods include: ? Whole grain cereals and breads. ? Brown rice. ? Beans. ? Fresh fruits and vegetables.  Take your prenatal vitamins until your postpartum checkup or until your health care provider tells you it is okay to stop. General instructions  Keep all follow-up visits for you and your baby as told by your health care provider. Most women visit their health care provider for a postpartum checkup within the first 3-6 weeks after delivery. Contact a health care provider if you:  Feel unable to cope with the changes that a new baby brings to your life, and these feelings do not go away.  Feel unusually   sad or worried.  Have breasts that are painful, hard, or turn red.  Have a fever.  Have trouble holding urine or keeping urine from leaking.  Have little or no interest in activities you used to enjoy.  Have not breastfed at all and you have not had a menstrual period for 12 weeks after delivery.  Have stopped breastfeeding and you have not had a menstrual period for 12 weeks after you stopped breastfeeding.  Have questions about caring for yourself or your baby.  Pass a blood clot from your vagina. Get help right away if you:  Have chest pain.  Have difficulty breathing.  Have sudden, severe leg pain.  Have severe pain or cramping in your abdomen.  Bleed from your vagina so much that you fill more than one sanitary pad in one hour. Bleeding should not be heavier than your heaviest period.  Develop a severe headache.  Faint.  Have blurred vision or spots in your  vision.  Have a bad-smelling vaginal discharge.  Have thoughts about hurting yourself or your baby. If you ever feel like you may hurt yourself or others, or have thoughts about taking your own life, get help right away. You can go to your nearest emergency department or call:  Your local emergency services (911 in the [place]  A suicide crisis helpline, such as the [place] at 574-381-0001. This is open 24 hours a day. Summary  The period of time from when you deliver your baby to up to 6-12 weeks after delivery is called the postpartum period.  Gradually return to your normal activities as told by your health care provider.  Keep all follow-up visits for you and your baby as told by your health care provider. This information is not intended to replace advice given to you by your health care provider. Make sure you discuss any questions you have with your health care provider. Document Released: 06/27/2000 Document Revised: 02/17/2018 Document Reviewed: 02/17/2018 Elsevier Patient Education  2020 [employer].

## 2019-03-28 ENCOUNTER — Encounter (HOSPITAL_COMMUNITY): Payer: Self-pay

## 2019-03-28 ENCOUNTER — Other Ambulatory Visit (HOSPITAL_COMMUNITY): Payer: Self-pay

## 2019-03-28 MED ORDER — OXYCODONE HCL 5 MG PO TABS: 5.0000 mg | ORAL_TABLET | ORAL | 0 refills | Status: DC | PRN

## 2019-03-28 NOTE — MAU Provider Note (Addendum)
Terri Padilla [id number] 07/01/1981  Patient at MAU registration requesting pain medication.  She states she was discharged from hospital yesterday and did not get to the pharmacy prior to closing.  Patient states she did not have any medications and was having pain.  Chart reviewed and reveals electronic script for Tramadol and Ibuprofen sent to pharmacy.  Review of chart shows patient given oxycodone and ibuprofen while on MBU.  Paper script given for oxycodone 5mg  tablet, Disp #2, RF 0.  Patient instructed to fill script at 24 hour pharmacy and then pick up other prescriptions tomorrow morning.  Patient questions why she did not receive Tramadol and informed that she is receiving medication from [facility].  No other questions or concerns.  Maryann Conners MSN, CNM 03/28/2019 3:40 AM  Addendum -[facility] pharmacists calls and states no instructions written on script.  Requests verbal order with copy of new script to be mailed.  Script sent electronically to [facility] at [address] and paper script voided.  Maryann Conners MSN, CNM 4:02 AM

## 2019-03-31 ENCOUNTER — Encounter (HOSPITAL_COMMUNITY): Payer: Self-pay | Admitting: *Deleted

## 2019-04-04 ENCOUNTER — Ambulatory Visit (INDEPENDENT_AMBULATORY_CARE_PROVIDER_SITE_OTHER): Payer: Self-pay | Admitting: Internal Medicine

## 2019-04-04 ENCOUNTER — Ambulatory Visit (INDEPENDENT_AMBULATORY_CARE_PROVIDER_SITE_OTHER): Payer: Self-pay

## 2019-04-04 ENCOUNTER — Other Ambulatory Visit: Payer: Self-pay

## 2019-04-04 VITALS — BP 112/72 | HR 82 | Temp 98.7°F

## 2019-04-04 VITALS — BP 122/67 | HR 74 | Wt 195.6 lb

## 2019-04-04 DIAGNOSIS — Z5189 Encounter for other specified aftercare: Secondary | ICD-10-CM

## 2019-04-04 DIAGNOSIS — Z79899 Other long term (current) drug therapy: Secondary | ICD-10-CM

## 2019-04-04 DIAGNOSIS — B20 Human immunodeficiency virus [HIV] disease: Secondary | ICD-10-CM

## 2019-04-04 MED ORDER — GENVOYA 150-150-200-10 MG PO TABS: 1.0000 | ORAL_TABLET | Freq: Every day | ORAL | 11 refills | Status: DC

## 2019-04-04 NOTE — Patient Instructions (Signed)
TUMS for gas otc  Take as prescribed on back of box Simethicone otc

## 2019-04-04 NOTE — Progress Notes (Signed)
RFV: follow up for hiv disease  Patient ID: Terri Padilla, female   DOB: 12-19-1980, 38 y.o.   MRN: [id number]  HPI 38 yo Fwith well controlled hiv disease, CD 4 count of 1525/VL<20, who had [id number] @ [redacted]w[redacted]d who was admitted for elective csection on 9/11  Plus iud placement. She delivered healthy 8 lb girl into this world. She states that her newborn is doing well, feeding well. Oretta has her Ob appt today to look at surgical incision. She denies that there is any erythema or drainage from the c-section incision  Outpatient Encounter Medications as of 04/04/2019  Medication Sig  . aspirin EC 81 MG tablet Take 1 tablet (81 mg total) by mouth daily. (Patient not taking: Reported on 01/07/2019)  . darunavir (PREZISTA) 800 MG tablet Take 1 tablet (800 mg total) by mouth daily with breakfast.  . GENVOYA 150-150-200-10 MG TABS tablet Take 1 tablet by mouth daily with breakfast.  . hydrocortisone (ANUSOL-HC) 25 MG suppository Place 1 suppository (25 mg total) rectally 2 (two) times daily. (Patient not taking: Reported on 03/17/2019)  . ibuprofen (ADVIL) 600 MG tablet Take 1 tablet (600 mg total) by mouth every 6 (six) hours as needed for mild pain or cramping.  Marland Kitchen oxyCODONE (ROXICODONE) 5 MG immediate release tablet Take 1 tablet (5 mg total) by mouth every 4 (four) hours as needed for severe pain.  . pramoxine (PROCTOFOAM) 1 % foam Place 1 application rectally 3 (three) times daily as needed for anal itching. (Patient not taking: Reported on 03/17/2019)  . traMADol (ULTRAM) 50 MG tablet Take 1 tablet (50 mg total) by mouth every 6 (six) hours as needed for up to 10 doses.   No facility-administered encounter medications on file as of 04/04/2019.      Patient Active Problem List   Diagnosis Date Noted  . History of cesarean delivery 03/25/2019  . IUD (intrauterine device) in place 03/25/2019  . Group B Streptococcus carrier, antepartum 03/13/2019  . Maternal iron deficiency anemia affecting  pregnancy in third trimester, antepartum 01/07/2019  . Obesity in pregnancy 12/15/2018  . UTI in pregnancy 11/15/2018  . Supervision of high risk pregnancy, antepartum 11/10/2018  . Language barrier 11/10/2018  . 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 Umbilical hernia 77/82/4235  . 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 AMA (advanced maternal age) multigravida 35+ 02/21/2016  . Previous cesarean delivery affecting pregnancy, antepartum 02/21/2016  . Down syndrome in child of prior pregnancy, currently pregnant 02/21/2016     There are no preventive care reminders to display for this patient.   Review of Systems Some fatigue from sleep deprivation with newborn, no fever,chills, nightsweats, no abdominal tenderness. 12 point ros is negative   Physical Exam   BP 112/72   Pulse 82   Temp 98.7 F (37.1 C) (Oral)   Physical Exam  Constitutional:  oriented to person, place, and time. appears well-developed and well-nourished. No distress.  HENT: [place]/AT, PERRLA, no scleral icterus Mouth/Throat: Oropharynx is clear and moist. No oropharyngeal exudate.  Cardiovascular: Normal rate, regular rhythm and normal heart sounds. Exam reveals no gallop and no friction rub.  No murmur heard.  Pulmonary/Chest: Effort normal and breath sounds normal. No respiratory distress.  has no wheezes.  Neck = supple, no nuchal rigidity Abdominal: Soft. Bowel sounds are normal.  No abdominal tenderness.  Lymphadenopathy: no cervical adenopathy. No axillary adenopathy Neurological: alert and oriented to person, place, and time.  Skin: Skin is warm and dry. No rash noted. No erythema.  Psychiatric: a normal mood and affect.  behavior is normal.   Lab Results  Component Value Date   CD4TCELL 44 12/27/2018   Lab Results  Component Value Date   CD4TABS 1,525 12/27/2018   CD4TABS 1,570 04/21/2018   CD4TABS 1,220 12/08/2017   Lab Results   Component Value Date   HIV1RNAQUANT <20 NOT DETECTED 03/17/2019   Lab Results  Component Value Date   HEPBSAB NEG 03/04/2016   Lab Results  Component Value Date   LABRPR NON REACTIVE 03/23/2019    CBC Lab Results  Component Value Date   WBC 17.7 (H) 03/26/2019   RBC 3.65 (L) 03/26/2019   HGB 9.9 (L) 03/26/2019   HCT 30.1 (L) 03/26/2019   PLT 302 03/26/2019   MCV 82.5 03/26/2019   MCH 27.1 03/26/2019   MCHC 32.9 03/26/2019   RDW 15.2 03/26/2019   LYMPHSABS 3,706 12/27/2018   MONOABS 546 03/04/2016   EOSABS 114 12/27/2018    BMET Lab Results  Component Value Date   NA 136 12/27/2018   K 4.0 12/27/2018   CL 104 12/27/2018   CO2 23 12/27/2018   GLUCOSE 84 12/27/2018   BUN 4 (L) 12/27/2018   CREATININE 0.65 03/26/2019   CALCIUM 9.2 12/27/2018   GFRNONAA >60 03/26/2019   GFRAA >60 03/26/2019      Assessment and Plan  HIV disease = does not have hx of NNRTI or II resistance. We will have her change to genvoya alone Will see her back in a few months to ensure she is undetectable still  Long term medication management = cr is stable  Hx of recent csection = if any drainage or pain at incision, need to contact ob or us for evaluation

## 2019-04-04 NOTE — Progress Notes (Signed)
Pt here today for incision check s/p rpt c-section on 03/25/19.  Pt denies any pain or bleeding at the site.  Incision well approximated, no drainage, no odor, and no erythema.  Removed old steri-strips and advised pt to have warm, soapy water to run over incision and then pat dry.  Pt also advised to continue to monitor for signs of infection.  Pt has pp visit scheduled on 05/04/19, pt notified of appt.  Pt verbalized understanding.   Mel Almond, RN 04/04/19

## 2019-04-06 NOTE — Progress Notes (Signed)
Patient seen and assessed by nursing staff during this encounter. I have reviewed the chart and agree with the documentation and plan.  Denisa Enterline, MD 04/06/2019 9:11 AM    

## 2019-04-22 NOTE — Telephone Encounter (Signed)
Husband was calling regarding finacial resources available for medical bills Advised to reach out to [facility] for financial assistance.   Laverle Patter, RN

## 2019-05-04 ENCOUNTER — Encounter: Payer: Self-pay | Admitting: Obstetrics and Gynecology

## 2019-05-04 ENCOUNTER — Ambulatory Visit (INDEPENDENT_AMBULATORY_CARE_PROVIDER_SITE_OTHER): Payer: Self-pay | Admitting: Obstetrics and Gynecology

## 2019-05-04 ENCOUNTER — Other Ambulatory Visit: Payer: Self-pay

## 2019-05-04 VITALS — BP 110/68 | HR 78 | Ht 60.0 in | Wt 194.8 lb

## 2019-05-04 DIAGNOSIS — O98719 Human immunodeficiency virus [HIV] disease complicating pregnancy, unspecified trimester: Secondary | ICD-10-CM

## 2019-05-04 DIAGNOSIS — Z9889 Other specified postprocedural states: Secondary | ICD-10-CM

## 2019-05-04 DIAGNOSIS — N879 Dysplasia of cervix uteri, unspecified: Secondary | ICD-10-CM

## 2019-05-04 DIAGNOSIS — Z789 Other specified health status: Secondary | ICD-10-CM

## 2019-05-04 DIAGNOSIS — Z975 Presence of (intrauterine) contraceptive device: Secondary | ICD-10-CM

## 2019-05-04 NOTE — Progress Notes (Signed)
Obstetrics/Postpartum Visit  Appointment Date: 05/04/2019  OBGYN Clinic: [facility]  Primary Care Provider: Patient, No Pcp Per  Chief Complaint:  Chief Complaint  Patient presents with  . Postpartum Care    History of Present Illness: Terri Padilla is a 38 y.o. African-American [id number] (No LMP recorded.), seen for the above chief complaint. Her past medical history is significant for HIV with undetectable viral load.  She is s/p RCS+IUD placement on 03/25/19 at 39 weeks; she was discharged to home on POD#2. Pregnancy complicated by HIV, AMA  Complains of intermittent bleeding.  Vaginal bleeding or discharge: Yes  Breast or formula feeding: bottle Intercourse: Yes  Contraception: IUD in place PP depression s/s: No  Any bowel or bladder issues: No  Pap smear: no abnormalities (date: 10/2018)  Review of Systems: Positive for n/a.   Her 12 point review of systems is negative or as noted in the History of Present Illness.  Patient Active Problem List   Diagnosis Date Noted  . History of cesarean delivery 03/25/2019  . IUD (intrauterine device) in place 03/25/2019  . Obesity in pregnancy 12/15/2018  . UTI in pregnancy 11/15/2018  . Language barrier 11/10/2018  . 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 Umbilical hernia 37/62/8315  . Previous cesarean delivery affecting pregnancy, antepartum 02/21/2016  . Down syndrome in child of prior pregnancy, currently pregnant 02/21/2016    Medications Melodie Djibo-Aboubacar had no medications administered during this visit. Current Outpatient Medications  Medication Sig Dispense Refill  . darunavir (PREZISTA) 800 MG tablet Take 1 tablet (800 mg total) by mouth daily with breakfast. 30 tablet 11  . elvitegravir-cobicistat-emtricitabine-tenofovir (GENVOYA) 150-150-200-10 MG TABS tablet Take 1 tablet by mouth daily with breakfast. 30 tablet 11  . GENVOYA 150-150-200-10 MG TABS tablet  Take 1 tablet by mouth daily with breakfast. 30 tablet 11  . ibuprofen (ADVIL) 600 MG tablet Take 1 tablet (600 mg total) by mouth every 6 (six) hours as needed for mild pain or cramping. 30 tablet 0  . aspirin EC 81 MG tablet Take 1 tablet (81 mg total) by mouth daily. (Patient not taking: Reported on 01/07/2019) 30 tablet 4  . hydrocortisone (ANUSOL-HC) 25 MG suppository Place 1 suppository (25 mg total) rectally 2 (two) times daily. (Patient not taking: Reported on 04/04/2019) 12 suppository 1  . oxyCODONE (ROXICODONE) 5 MG immediate release tablet Take 1 tablet (5 mg total) by mouth every 4 (four) hours as needed for severe pain. (Patient not taking: Reported on 04/04/2019) 2 tablet 0  . pramoxine (PROCTOFOAM) 1 % foam Place 1 application rectally 3 (three) times daily as needed for anal itching. (Patient not taking: Reported on 04/04/2019) 15 g 2  . traMADol (ULTRAM) 50 MG tablet Take 1 tablet (50 mg total) by mouth every 6 (six) hours as needed for up to 10 doses. (Patient not taking: Reported on 05/04/2019) 10 tablet 0   No current facility-administered medications for this visit.     Allergies Chloroquine  Physical Exam:  BP 110/68   Pulse 78   Ht 5' (1.524 m)   Wt 194 lb 12.8 oz (88.4 kg)   Breastfeeding No   BMI 38.04 kg/m  Body mass index is 38.04 kg/m. General appearance: Well nourished, well developed female in no acute distress.  Cardiovascular: regular rate and rhythm Respiratory:  Normal respiratory effort Abdomen: no masses, hernias; diffusely non tender to palpation, non distended, well healed pfannenstiel incision Breasts: not examined. Neuro/Psych:  Normal mood and affect.  Skin:  Warm and dry.   Pelvic exam: is not limited by body habitus EGBUS: within normal limits Vagina: within normal limits and with None blood in the vault, Cervix:  no lesions, not digitally examined, IUD strings not seen  PP Depression Screening:   Edinburgh Postnatal Depression Scale -  05/04/19 1401      Edinburgh Postnatal Depression Scale:  In the Past 7 Days   I have been able to laugh and see the funny side of things.  0    I have looked forward with enjoyment to things.  0    I have blamed myself unnecessarily when things went wrong.  0    I have been anxious or worried for no good reason.  0    I have felt scared or panicky for no good reason.  0    Things have been getting on top of me.  0    I have been so unhappy that I have had difficulty sleeping.  0    I have felt sad or miserable.  0    I have been so unhappy that I have been crying.  0    The thought of harming myself has occurred to me.  0    Edinburgh Postnatal Depression Scale Total  0       Assessment: Patient is a 38 y.o. [id number] who is 5 weeks post partum from a repeat c-section and IUD placement. She is doing well. IUD strings not seen, TVUS ordered  Plan:   1. Postoperative state Doing well  2. Postpartum state Doing well, no issues  3. HIV infection in mother during pregnancy, antepartum Taking meds  4. Cervix dysplasia Normal pap 10/2018  5. Language barrier Declined interpretor  6. IUD (intrauterine device) in place Placed during RCS Strings not seen today [place] ordered   RTC prn   K. Therese Sarah, M.D. Attending Center for [employer] [facility])

## 2019-05-11 ENCOUNTER — Other Ambulatory Visit: Payer: Self-pay

## 2019-05-11 ENCOUNTER — Ambulatory Visit
Admission: RE | Admit: 2019-05-11 | Discharge: 2019-05-11 | Disposition: A | Discharge status: Home / Self Care | Payer: Self-pay | Source: Ambulatory Visit | Attending: Obstetrics and Gynecology | Admitting: Obstetrics and Gynecology

## 2019-05-11 DIAGNOSIS — Z975 Presence of (intrauterine) contraceptive device: Secondary | ICD-10-CM | POA: Insufficient documentation

## 2019-05-16 ENCOUNTER — Telehealth: Payer: Self-pay

## 2019-05-16 NOTE — Telephone Encounter (Addendum)
-----   Message from Sloan Leiter, MD sent at 05/13/2019  6:12 PM EDT ----- Please let patient know IUD is in correct spot and can be used for contraception.  [place] interpreter [phone] used for encounter.   Called pt and notified her that IUD is in the correct place and can be used for birth control. Pt asks if she is able to exercise at this time. Reviewed with Purcell Nails, NP who states pt may begin physical activity with caution. Pt made aware and verbalizes understanding.

## 2019-06-02 ENCOUNTER — Encounter: Payer: Self-pay | Admitting: Internal Medicine

## 2019-08-31 ENCOUNTER — Other Ambulatory Visit: Payer: Self-pay | Admitting: Pediatrics

## 2019-08-31 ENCOUNTER — Other Ambulatory Visit: Payer: Self-pay

## 2019-08-31 DIAGNOSIS — Z21 Asymptomatic human immunodeficiency virus [HIV] infection status: Secondary | ICD-10-CM

## 2019-08-31 NOTE — Addendum Note (Signed)
Addended by: Alycia Patten on: 08/31/2019 04:53 PM   Modules accepted: Orders

## 2019-09-03 LAB — HIV-1 RNA QUANT-NO REFLEX-BLD
HIV 1 RNA Quant: <20 copies/mL
HIV-1 RNA Quant, Log: <1.3 Log copies/mL

## 2019-09-05 NOTE — Progress Notes (Signed)
Please call mom and tell her that her HIV test (the mom's) was no HIV detected which means she is taking her medicines as prescribed. I have communicated with pediatric ID and we will test Lala at the next appointment.

## 2019-09-07 NOTE — Progress Notes (Signed)
Called patient and reported lab results.

## 2020-01-19 ENCOUNTER — Telehealth: Payer: Self-pay | Admitting: Pharmacy Technician

## 2020-01-19 ENCOUNTER — Other Ambulatory Visit: Payer: Self-pay

## 2020-01-19 MED ORDER — GENVOYA 150-150-200-10 MG PO TABS: 1.0000 | ORAL_TABLET | Freq: Every day | ORAL | 1 refills | Status: DC

## 2020-01-19 NOTE — Telephone Encounter (Signed)
Patient walked into clinic stating she was out of medications .  She has not renewed her HMAP.  She states she has only been out of medicaiton for a few days but refills indicate there has been no recent refills.   She is taking Genvoya and Prezista.   Kathie Rhodes, Pharamcy tech. Will work with getting a patient Music therapist for [employer] and she was given samples of Prezista.    Request for Genvoya to be sent to [facility] in [place] , [place] [employer].    Laurell Josephs, RN

## 2020-01-19 NOTE — Telephone Encounter (Addendum)
[facility] Patient Advocate Encounter  Completed and sent [employer] Advancing Access application for Genvoya for this patient who is uninsured.    Patient is approved 01/19/2020 through 012/31/2021.    The patient was given a sample month of Prezista.  These will bridge her until ADAP approved.  To be approved, patient must provide [employer] with proof of income.  Netty Starring. Dimas Aguas CPhT Specialty Pharmacy Patient [facility] for Infectious Disease Phone: [phone] Fax:  [phone]

## 2020-03-13 ENCOUNTER — Encounter: Payer: Self-pay | Admitting: Internal Medicine

## 2020-03-27 ENCOUNTER — Other Ambulatory Visit: Payer: Self-pay | Admitting: Internal Medicine

## 2020-03-27 DIAGNOSIS — B20 Human immunodeficiency virus [HIV] disease: Secondary | ICD-10-CM

## 2020-03-27 DIAGNOSIS — O0991 Supervision of high risk pregnancy, unspecified, first trimester: Secondary | ICD-10-CM

## 2020-03-28 ENCOUNTER — Other Ambulatory Visit: Payer: Self-pay | Admitting: *Deleted

## 2020-03-28 DIAGNOSIS — O0991 Supervision of high risk pregnancy, unspecified, first trimester: Secondary | ICD-10-CM

## 2020-03-28 DIAGNOSIS — B20 Human immunodeficiency virus [HIV] disease: Secondary | ICD-10-CM

## 2020-03-28 MED ORDER — DARUNAVIR ETHANOLATE 800 MG PO TABS: 800.0000 mg | ORAL_TABLET | Freq: Every day | ORAL | 1 refills | Status: DC

## 2020-03-28 MED ORDER — GENVOYA 150-150-200-10 MG PO TABS: 1.0000 | ORAL_TABLET | Freq: Every day | ORAL | 1 refills | Status: DC

## 2020-11-14 ENCOUNTER — Encounter: Payer: Self-pay | Admitting: Internal Medicine

## 2020-11-14 ENCOUNTER — Ambulatory Visit: Payer: Self-pay

## 2020-11-14 ENCOUNTER — Telehealth: Payer: Self-pay

## 2020-11-14 ENCOUNTER — Ambulatory Visit (INDEPENDENT_AMBULATORY_CARE_PROVIDER_SITE_OTHER): Payer: Self-pay | Admitting: Internal Medicine

## 2020-11-14 ENCOUNTER — Other Ambulatory Visit: Payer: Self-pay

## 2020-11-14 VITALS — BP 110/73 | HR 76 | Temp 98.4°F | Wt 205.0 lb

## 2020-11-14 DIAGNOSIS — N3000 Acute cystitis without hematuria: Secondary | ICD-10-CM

## 2020-11-14 DIAGNOSIS — B20 Human immunodeficiency virus [HIV] disease: Secondary | ICD-10-CM

## 2020-11-14 MED ORDER — GENVOYA 150-150-200-10 MG PO TABS: 1.0000 | ORAL_TABLET | Freq: Every day | ORAL | 5 refills | Status: DC

## 2020-11-14 MED ORDER — SULFAMETHOXAZOLE-TRIMETHOPRIM 800-160 MG PO TABS: 1.0000 | ORAL_TABLET | Freq: Two times a day (BID) | ORAL | 0 refills | Status: AC

## 2020-11-14 MED ORDER — DARUNAVIR 800 MG PO TABS: 800.0000 mg | ORAL_TABLET | Freq: Every day | ORAL | 5 refills | Status: DC

## 2020-11-14 NOTE — Patient Instructions (Signed)
Thank you for coming to see me today. It was a pleasure seeing you.  To Do: Marland Kitchen Labs today and urine specimen . I sent an antibiotic for urinary infection to [facility] in [place] . I refilled your Darrin Luis and Prezista [facility] . Please follo wup with Dr Drue Second in about 3 months.   If you have any questions or concerns, please do not hesitate to call the office at [phone].  Take Care,   Gwynn Burly, DO

## 2020-11-14 NOTE — Progress Notes (Signed)
[facility]   CHIEF COMPLAINT    HIV follow up and concern for UTI  SUBJECTIVE:    Terri Padilla is a 40 y.o. female with PMHx as below who presents to the clinic for HIV follow up and acute complaint with concern for UTI.   She routinely follows with Terri Padilla and was last seen on 04/04/19.  At that time it was noted she does not have hx of NNRTI or II resistance and was going to continue with Genvoya alone.  However, pt reports that she has been on [place] and Prezista since that time.  She has been taking both medications daily without any adverse effects.  She has missed the last 2 weeks of medications because she ran out of her meds completely.  Her most recent HIV RNA was undetectable in February 2021.  Her Genvoya and Prezista were refilled on 03/28/20 for 30 days plus 1 refill.  She reports she had enough medication at home until 2 weeks ago due to pharmacy having accidentally sent her extra bottles previously.  She also reports dysuria and vaginal discomfort the past 2 days after recent intercourse with her husband.  She has no lesions, CVA tenderness, fevers, nausea, or vomiting.  She as only been with her husband and believes he has also been monogamous as well.  Please see A&P for the details of today's visit and status of the patient's medical problems.   Patient's Medications  New Prescriptions   SULFAMETHOXAZOLE-TRIMETHOPRIM (BACTRIM DS) 800-160 MG TABLET    Take 1 tablet by mouth 2 (two) times daily for 3 days.  Previous Medications   DARUNAVIR (PREZISTA) 800 MG TABLET    Take 1 tablet (800 mg total) by mouth daily with breakfast.   GENVOYA 150-150-200-10 MG TABS TABLET    Take 1 tablet by mouth daily with breakfast.  Modified Medications   No medications on file  Discontinued Medications   ASPIRIN EC 81 MG TABLET    Take 1 tablet (81 mg total) by mouth daily.   HYDROCORTISONE (ANUSOL-HC) 25 MG SUPPOSITORY    Place 1  suppository (25 mg total) rectally 2 (two) times daily.   IBUPROFEN (ADVIL) 600 MG TABLET    Take 1 tablet (600 mg total) by mouth every 6 (six) hours as needed for mild pain or cramping.   OXYCODONE (ROXICODONE) 5 MG IMMEDIATE RELEASE TABLET    Take 1 tablet (5 mg total) by mouth every 4 (four) hours as needed for severe pain.   PRAMOXINE (PROCTOFOAM) 1 % FOAM    Place 1 application rectally 3 (three) times daily as needed for anal itching.   TRAMADOL (ULTRAM) 50 MG TABLET    Take 1 tablet (50 mg total) by mouth every 6 (six) hours as needed for up to 10 doses.      Past Medical History:  Diagnosis Date  . HIV (human immunodeficiency virus infection) ([place])     Social History   Tobacco Use  . Smoking status: Never Smoker  . Smokeless tobacco: Never Used  Substance Use Topics  . Alcohol use: No  . Drug use: No    Family History  Problem Relation Age of Onset  . Arthritis Mother   . Down syndrome Daughter   . Asthma Neg Hx   . Alcohol abuse Neg Hx   . Birth defects Neg Hx   . Cancer Neg Hx   . COPD Neg Hx   . Depression  Neg Hx   . Diabetes Neg Hx   . Drug abuse Neg Hx   . Early death Neg Hx   . Hearing loss Neg Hx   . Heart disease Neg Hx   . Hyperlipidemia Neg Hx   . Hypertension Neg Hx   . Kidney disease Neg Hx   . Learning disabilities Neg Hx   . Mental illness Neg Hx   . Mental retardation Neg Hx   . Miscarriages / Stillbirths Neg Hx   . Stroke Neg Hx   . Vision loss Neg Hx   . Varicose Veins Neg Hx     Allergies  Allergen Reactions  . Chloroquine Itching    Per Interpreter: Nivaquine (Chloroquine)    Review of Systems  Constitutional: Negative.   Respiratory: Negative.   Cardiovascular: Negative.   Gastrointestinal: Negative.   Genitourinary: Positive for dysuria. Negative for flank pain and hematuria.       + vaginal discomfort - lesions     OBJECTIVE:    Vitals:   11/14/20 1600  BP: 110/73  Pulse: 76  Temp: 98.4 F (36.9 C)  TempSrc: Oral   Weight: 205 lb (93 kg)     Body mass index is 40.04 kg/m.  Physical Exam Constitutional:      General: She is not in acute distress.    Appearance: Normal appearance.  Pulmonary:     Effort: Pulmonary effort is normal. No respiratory distress.  Neurological:     General: No focal deficit present.     Mental Status: She is alert and oriented to person, place, and time.  Psychiatric:        Mood and Affect: Mood normal.        Behavior: Behavior normal.     Labs and Microbiology: CMP Latest Ref Rng & Units 03/26/2019 12/27/2018 11/10/2018  Glucose 65 - 99 mg/dL - 84 83  BUN 7 - 25 mg/dL - 4(L) 4(L)  Creatinine 0.44 - 1.00 mg/dL 0.65 0.53 0.49(L)  Sodium 135 - 146 mmol/L - 136 138  Potassium 3.5 - 5.3 mmol/L - 4.0 4.3  Chloride 98 - 110 mmol/L - 104 104  CO2 20 - 32 mmol/L - 23 20  Calcium 8.6 - 10.2 mg/dL - 9.2 8.7  Total Protein 6.1 - 8.1 g/dL - 6.4 6.0  Total Bilirubin 0.2 - 1.2 mg/dL - 0.3 <0.2  Alkaline Phos 39 - 117 IU/L - - 47  AST 10 - 30 U/L - 13 13  ALT 6 - 29 U/L - 12 13   CBC Latest Ref Rng & Units 03/26/2019 03/25/2019 03/23/2019  WBC 4.0 - 10.5 K/uL 17.7(H) 9.8 10.0  Hemoglobin 12.0 - 15.0 g/dL 9.9(L) 9.7(L) 9.7(L)  Hematocrit 36.0 - 46.0 % 30.1(L) 29.1(L) 28.9(L)  Platelets 150 - 400 K/uL 302 269 257     Lab Results  Component Value Date   HIV1RNAQUANT <20 NOT DETECTED 08/31/2019   HIV1RNAQUANT <20 NOT DETECTED 03/17/2019   HIV1RNAQUANT 80 (H) 12/27/2018   CD4TABS 1,525 12/27/2018   CD4TABS 1,570 04/21/2018   CD4TABS 1,220 12/08/2017    RPR and STI: Lab Results  Component Value Date   LABRPR NON REACTIVE 03/23/2019   LABRPR Non Reactive 01/06/2019   LABRPR NON-REACTIVE 12/27/2018   LABRPR Non Reactive 11/10/2018   LABRPR NON-REACTIVE 12/08/2017    STI Results GC CT  03/08/2019 Negative Negative  10/29/2018 Negative Negative  12/08/2017 Negative Negative  05/28/2016 Negative Negative  03/04/2016 Negative Negative  02/25/2016 Negative Negative  02/21/2016 Negative Negative    Hepatitis B: Lab Results  Component Value Date   HEPBSAB NEG 03/04/2016   HEPBSAG Negative 11/10/2018   HEPBCAB NON REACTIVE 03/04/2016   Hepatitis C: No results found for: HEPCAB, HCVRNAPCRQN Hepatitis A: Lab Results  Component Value Date   HAV REACTIVE (A) 03/04/2016   Lipids: Lab Results  Component Value Date   CHOL 183 12/08/2017   TRIG 63 12/08/2017   HDL 47 (L) 12/08/2017   CHOLHDL 3.9 12/08/2017   VLDL 39 (H) 03/04/2016   LDLCALC 121 (H) 12/08/2017     ASSESSMENT & PLAN:    1. HIV disease ([place]) Will refill her ART that she has previously been taking and had her virally suppressed with Genvoya and Prezista.  Will check labs today and RTC 3 months with Terri Padilla to ensure still suppressed given recent hiatus in medications.  She also met with financial today to get her ADAP renewed.  2. Acute cystitis without hematuria This is a new issues and her symptoms c/w UTI. Will give TMP SMX for 3 days and check UA and culture.  Also check urinary GC/CT.  RTC as needed if symptoms do not improve.   Orders Placed This Encounter  Procedures  . Urine Culture  . COMPLETE METABOLIC PANEL WITH GFR  . CBC  . HIV-1 RNA quant-no reflex-bld  . T-helper cell (CD4)- ([facility] clinic only)  . Urinalysis, Routine w reflex microscopic     [facility] 11/14/2020, 4:18 PM

## 2020-11-14 NOTE — Telephone Encounter (Signed)
Patient's husband called stating patient is complaining of vaginal discomfort and dysuria x 2 days. Patient husband states patient denies and vaginal discharge.  Patient also past due for a follow up appointment and has not renewed her ADAP. Patient scheduled today with Dr. Earlene Plater for her follow up and will renew her ADAP. Patient's husband was not 100% sure if patient had be taking the medication. Terri Padilla

## 2020-11-15 ENCOUNTER — Encounter: Payer: Self-pay | Admitting: Internal Medicine

## 2020-11-15 LAB — URINE CULTURE
MICRO NUMBER:: 11849976
SPECIMEN QUALITY:: ADEQUATE

## 2020-11-15 LAB — URINALYSIS, ROUTINE W REFLEX MICROSCOPIC
Bacteria, UA: NONE SEEN /HPF
Bilirubin Urine: NEGATIVE
Glucose, UA: NEGATIVE
Hgb urine dipstick: NEGATIVE
Hyaline Cast: NONE SEEN /LPF
Ketones, ur: NEGATIVE
Nitrite: NEGATIVE
Protein, ur: NEGATIVE
Specific Gravity, Urine: 1.023 (ref 1.001–1.035)
pH: 6 (ref 5.0–8.0)

## 2020-11-15 LAB — T-HELPER CELL (CD4) - (RCID CLINIC ONLY)
CD4 % Helper T Cell: 47 % (ref 33–65)
CD4 T Cell Abs: 1322 /uL (ref 400–1790)

## 2020-11-15 LAB — MICROSCOPIC MESSAGE

## 2020-11-16 LAB — URINE CYTOLOGY ANCILLARY ONLY
Chlamydia: NEGATIVE
Comment: NEGATIVE
Comment: NEGATIVE
Comment: NORMAL
Neisseria Gonorrhea: NEGATIVE
Trichomonas: NEGATIVE

## 2020-11-17 LAB — COMPLETE METABOLIC PANEL WITH GFR
AG Ratio: 1.5 (calc) (ref 1.0–2.5)
ALT: 13 U/L (ref 6–29)
AST: 12 U/L (ref 10–30)
Albumin: 4 g/dL (ref 3.6–5.1)
Alkaline phosphatase (APISO): 52 U/L (ref 31–125)
BUN: 11 mg/dL (ref 7–25)
CO2: 24 mmol/L (ref 20–32)
Calcium: 9 mg/dL (ref 8.6–10.2)
Chloride: 106 mmol/L (ref 98–110)
Creat: 0.68 mg/dL (ref 0.50–1.10)
GFR, Est African American: 127 mL/min/{1.73_m2} (ref >=60)
GFR, Est Non African American: 109 mL/min/{1.73_m2} (ref >=60)
Globulin: 2.7 g/dL (calc) (ref 1.9–3.7)
Glucose, Bld: 95 mg/dL (ref 65–99)
Potassium: 4.3 mmol/L (ref 3.5–5.3)
Sodium: 138 mmol/L (ref 135–146)
Total Bilirubin: 0.2 mg/dL (ref 0.2–1.2)
Total Protein: 6.7 g/dL (ref 6.1–8.1)

## 2020-11-17 LAB — CBC
HCT: 38.4 % (ref 35.0–45.0)
Hemoglobin: 12.5 g/dL (ref 11.7–15.5)
MCH: 27.4 pg (ref 27.0–33.0)
MCHC: 32.6 g/dL (ref 32.0–36.0)
MCV: 84 fL (ref 80.0–100.0)
MPV: 11.1 fL (ref 7.5–12.5)
Platelets: 314 10*3/uL (ref 140–400)
RBC: 4.57 10*6/uL (ref 3.80–5.10)
RDW: 13 % (ref 11.0–15.0)
WBC: 8.3 10*3/uL (ref 3.8–10.8)

## 2020-11-17 LAB — HIV-1 RNA QUANT-NO REFLEX-BLD
HIV 1 RNA Quant: 32 Copies/mL — ABNORMAL HIGH
HIV-1 RNA Quant, Log: 1.51 Log cps/mL — ABNORMAL HIGH

## 2020-11-19 ENCOUNTER — Telehealth: Payer: Self-pay

## 2020-11-19 NOTE — Telephone Encounter (Signed)
Attempted to call patient with [employer], no answer and unable to leave voicemail.   Sandie Ano, RN

## 2020-11-19 NOTE — Telephone Encounter (Signed)
-----   Message from Andrew N Wallace, DO sent at 11/19/2020  1:19 PM EDT ----- Please let patient know that her UA and culture was not revealing for infection but she was treated for presumed UTI regardless.  Her viral load is only 32 copies and she should continue ART and follow up with Dr Snider in about 3 months.  Thanks, Andrew 

## 2020-11-20 ENCOUNTER — Telehealth: Payer: Self-pay

## 2020-11-20 ENCOUNTER — Other Ambulatory Visit (HOSPITAL_COMMUNITY): Payer: Self-pay

## 2020-11-20 NOTE — Telephone Encounter (Signed)
-----   Message from Kathlynn Grate, DO sent at 11/19/2020  1:19 PM EDT ----- Please let patient know that her UA and culture was not revealing for infection but she was treated for presumed UTI regardless.  Her viral load is only 32 copies and she should continue ART and follow up with Dr Drue Second in about 3 months.  Thanks, Greig Castilla

## 2020-11-20 NOTE — Telephone Encounter (Signed)
Attempted to call patient's cell phone, no answer and voicemail not set up. Called home number, female answered the phone and advised to try patient's cell.   Sandie Ano, RN

## 2020-11-20 NOTE — Telephone Encounter (Signed)
I have spoke with patient and relayed her lab results to her. Patient did report she has not had her medication in 2 weeks due to patient not renewing ADAP on time. Lupita Leash will be working on getting patient a 30 day supply with patient assistance.  Zyrus Hetland T Pricilla Loveless

## 2021-02-27 ENCOUNTER — Ambulatory Visit: Payer: Self-pay | Admitting: Internal Medicine

## 2021-04-29 ENCOUNTER — Ambulatory Visit: Payer: Self-pay | Admitting: Internal Medicine

## 2021-09-12 ENCOUNTER — Other Ambulatory Visit: Payer: Self-pay

## 2021-09-12 ENCOUNTER — Encounter: Payer: Self-pay | Admitting: Emergency Medicine

## 2021-09-12 ENCOUNTER — Emergency Department
Admission: EM | Admit: 2021-09-12 | Discharge: 2021-09-12 | Disposition: A | Discharge status: Home / Self Care | Payer: Self-pay | Attending: Emergency Medicine | Admitting: Emergency Medicine

## 2021-09-12 DIAGNOSIS — Z20822 Contact with and (suspected) exposure to covid-19: Secondary | ICD-10-CM | POA: Insufficient documentation

## 2021-09-12 DIAGNOSIS — J020 Streptococcal pharyngitis: Secondary | ICD-10-CM | POA: Insufficient documentation

## 2021-09-12 LAB — BASIC METABOLIC PANEL
Anion gap: 5 (ref 5–15)
BUN: 9 mg/dL (ref 6–20)
CO2: 25 mmol/L (ref 22–32)
Calcium: 9 mg/dL (ref 8.9–10.3)
Chloride: 103 mmol/L (ref 98–111)
Creatinine, Ser: 0.63 mg/dL (ref 0.44–1.00)
GFR, Estimated: >60 mL/min (ref >60)
Glucose, Bld: 131 mg/dL — ABNORMAL HIGH (ref 70–99)
Potassium: 3.7 mmol/L (ref 3.5–5.1)
Sodium: 133 mmol/L — ABNORMAL LOW (ref 135–145)

## 2021-09-12 LAB — CBC WITH DIFFERENTIAL/PLATELET
Abs Immature Granulocytes: 0.02 10*3/uL (ref 0.00–0.07)
Basophils Absolute: 0 10*3/uL (ref 0.0–0.1)
Basophils Relative: 0 %
Eosinophils Absolute: 0.1 10*3/uL (ref 0.0–0.5)
Eosinophils Relative: 1 %
HCT: 38 % (ref 36.0–46.0)
Hemoglobin: 12.2 g/dL (ref 12.0–15.0)
Immature Granulocytes: 0 %
Lymphocytes Relative: 35 %
Lymphs Abs: 2.7 10*3/uL (ref 0.7–4.0)
MCH: 26.2 pg (ref 26.0–34.0)
MCHC: 32.1 g/dL (ref 30.0–36.0)
MCV: 81.7 fL (ref 80.0–100.0)
Monocytes Absolute: 0.6 10*3/uL (ref 0.1–1.0)
Monocytes Relative: 7 %
Neutro Abs: 4.2 10*3/uL (ref 1.7–7.7)
Neutrophils Relative %: 57 %
Platelets: 347 10*3/uL (ref 150–400)
RBC: 4.65 MIL/uL (ref 3.87–5.11)
RDW: 13.2 % (ref 11.5–15.5)
WBC: 7.6 10*3/uL (ref 4.0–10.5)
nRBC: 0 % (ref 0.0–0.2)

## 2021-09-12 LAB — MONONUCLEOSIS SCREEN: Mono Screen: NEGATIVE

## 2021-09-12 LAB — RESP PANEL BY RT-PCR (FLU A&B, COVID) ARPGX2
Influenza A by PCR: NEGATIVE
Influenza B by PCR: NEGATIVE
SARS Coronavirus 2 by RT PCR: NEGATIVE

## 2021-09-12 LAB — GROUP A STREP BY PCR: Group A Strep by PCR: DETECTED — AB

## 2021-09-12 MED ORDER — AMOXICILLIN 875 MG PO TABS: 875.0000 mg | ORAL_TABLET | Freq: Two times a day (BID) | ORAL | 0 refills | Status: AC

## 2021-09-12 MED ORDER — AMOXICILLIN 500 MG PO CAPS
500.0000 mg | ORAL_CAPSULE | Freq: Once | ORAL | Status: AC
  Administered 2021-09-12: 500 mg via ORAL
  Filled 2021-09-12: qty 1

## 2021-09-12 NOTE — ED Provider Triage Note (Signed)
Emergency Medicine Provider Triage Evaluation Note ? ?Terri Padilla , a 41 y.o. female  was evaluated in triage.  Pt complains of sore throat for 2 weeks.  States she was treated here 2 weeks ago but no records on file.  States she was treated with a medication and did not see any improvement.  She denies any fevers, shortness of breath, chest pain.  She states she feels very tired.  No difficulty swallowing. ? ?Review of Systems  ?Positive: Sore throat, fatigue ?Negative: Fever, chills, chest pain, shortness of breath ? ?Physical Exam  ?BP (!) 104/47 (BP Location: Right Arm)   Pulse 93   Temp 98.3 ?F (36.8 ?C) (Oral)   Resp 19   SpO2 99%  ?Gen:   Awake, no distress   ?Resp:  Normal effort  ?MSK:   Moves extremities without difficulty  ?Other:  No significant pharyngeal erythema or exudates.  No sign of peritonsillar abscess. ? ?Medical Decision Making  ?Medically screening exam initiated at 4:11 PM.  Appropriate orders placed.  Gloriana Piltz was informed that the remainder of the evaluation will be completed by another provider, this initial triage assessment does not replace that evaluation, and the importance of remaining in the ED until their evaluation is complete. ? ? ?  ?Evon Slack, PA-C ?09/12/21 1612 ? ?

## 2021-09-12 NOTE — Discharge Instructions (Addendum)
Take Amoxicillin twice daily for ten days.  

## 2021-09-12 NOTE — ED Triage Notes (Signed)
Pt in via POV, reports ongoing sore throat w/ body aches x 2 weeks.  Vitals WDL, NAD noted at this time. ?

## 2021-09-12 NOTE — ED Provider Notes (Signed)
? ?[facility] ?Provider Note ? ?Patient Contact: 6:19 PM (approximate) ? ? ?History  ? ?Sore Throat ? ? ?HPI ? ?Terri Padilla is a 41 y.o. female presents to the emergency department with sore throat body aches for the past 2 weeks.  Patient has been maintaining her own secretions and can speak in complete sentences.  No chest pain, chest tightness or abdominal pain. ? ?  ? ? ?Physical Exam  ? ?Triage Vital Signs: ?ED Triage Vitals  ?Enc Vitals Group  ?   BP 09/12/21 1610 (!) 104/47  ?   Pulse Rate 09/12/21 1610 93  ?   Resp 09/12/21 1610 19  ?   Temp 09/12/21 1610 98.3 ?F (36.8 ?C)  ?   Temp Source 09/12/21 1610 Oral  ?   SpO2 09/12/21 1610 99 %  ?   Weight 09/12/21 1614 211 lb 10.3 oz (96 kg)  ?   Height 09/12/21 1614 5\' 7"  (1.702 m)  ?   Head Circumference --   ?   Peak Flow --   ?   Pain Score 09/12/21 1611 9  ?   Pain Loc --   ?   Pain Edu? --   ?   Excl. in GC? --   ? ? ?Most recent vital signs: ?Vitals:  ? 09/12/21 1610 09/12/21 1913  ?BP: (!) 104/47 110/66  ?Pulse: 93 88  ?Resp: 19 18  ?Temp: 98.3 ?F (36.8 ?C)   ?SpO2: 99% 98%  ? ? ? ?General: Alert and in no acute distress. ?Eyes:  PERRL. EOMI. ?Head: No acute traumatic findings ?ENT: ?     Ears:  ?     Nose: No congestion/rhinnorhea. ?     Mouth/Throat: Mucous membranes are moist.  Posterior pharynx is erythematous.  Uvula is midline.  Patient has palpable anterior cervical lymphadenopathy. ?Neck: No stridor. No cervical spine tenderness to palpation. ?Cardiovascular:  Good peripheral perfusion ?Respiratory: Normal respiratory effort without tachypnea or retractions. Lungs CTAB. Good air entry to the bases with no decreased or absent breath sounds. ?Gastrointestinal: Bowel sounds ?4 quadrants. Soft and nontender to palpation. No guarding or rigidity. No palpable masses. No distention. No CVA tenderness. ?Musculoskeletal: Full range of motion to all extremities.  ?Neurologic:  No gross focal neurologic deficits are  appreciated.  ?Skin:   No rash noted ?Other: ? ? ?ED Results / Procedures / Treatments  ? ?Labs ?(all labs ordered are listed, but only abnormal results are displayed) ?Labs Reviewed  ?GROUP A STREP BY PCR - Abnormal; Notable for the following components:  ?    Result Value  ? Group A Strep by PCR DETECTED (*)   ? All other components within normal limits  ?BASIC METABOLIC PANEL - Abnormal; Notable for the following components:  ? Sodium 133 (*)   ? Glucose, Bld 131 (*)   ? All other components within normal limits  ?RESP PANEL BY RT-PCR (FLU A&B, COVID) [id number]  ?CBC WITH DIFFERENTIAL/PLATELET  ?MONONUCLEOSIS SCREEN  ? ? ? ? ? ? ?PROCEDURES: ? ?Critical Care performed: No ? ?Procedures ? ? ?MEDICATIONS ORDERED IN ED: ?Medications  ?amoxicillin (AMOXIL) capsule 500 mg (500 mg Oral Given 09/12/21 1841)  ? ? ? ?IMPRESSION / MDM / ASSESSMENT AND PLAN / ED COURSE  ?I reviewed the triage vital signs and the nursing notes. ?             ?               ? ?  Assessment and Plan:  ?Strep throat:  ?Differential diagnosis includes, but is not limited to, strep throat, COVID-19, influenza, mono.. ? ?41 year old female presents to the emergency department with pharyngitis and body aches for the past 2 weeks. ? ?Vital signs are reassuring at triage.  On physical exam, patient was alert, active and nontoxic-appearing.  She did test positive for group A strep.  She was discharged with Amoxicillin twice daily for ten days. Return precautions were given to return with new or worsening symptoms.  ? ? ?FINAL CLINICAL IMPRESSION(S) / ED DIAGNOSES  ? ?Final diagnoses:  ?Strep throat  ? ? ? ?Rx / DC Orders  ? ?ED Discharge Orders   ? ?      Ordered  ?  amoxicillin (AMOXIL) 875 MG tablet  2 times daily       ? 09/12/21 1901  ? ?  ?  ? ?  ? ? ? ?Note:  This document was prepared using Dragon voice recognition software and may include unintentional dictation errors. ?  ?Orvil Feil, [place] ?09/12/21 2033 ? ?  ?Sharman Cheek, MD ?09/12/21  2159 ? ?

## 2021-09-27 IMAGING — US US PELVIS COMPLETE WITH TRANSVAGINAL
1 series · 14 of 25 positions shown · non-contrast
Comparison: None

CLINICAL DATA: IUD placement, IUD placed during C-section, strings
not visible, intermittent spotting

EXAM:
TRANSABDOMINAL AND TRANSVAGINAL ULTRASOUND OF PELVIS
TECHNIQUE: Both transabdominal and transvaginal ultrasound examinations of the
pelvis were performed. Transabdominal technique was performed for
global imaging of the pelvis including uterus, ovaries, adnexal
regions, and pelvic cul-de-sac. It was necessary to proceed with
endovaginal exam following the transabdominal exam to visualize the
endometrium and IUD.

[Series 1: us pelvis complete with transvaginal · 14 of 96 slices shown]
[im 1/96]
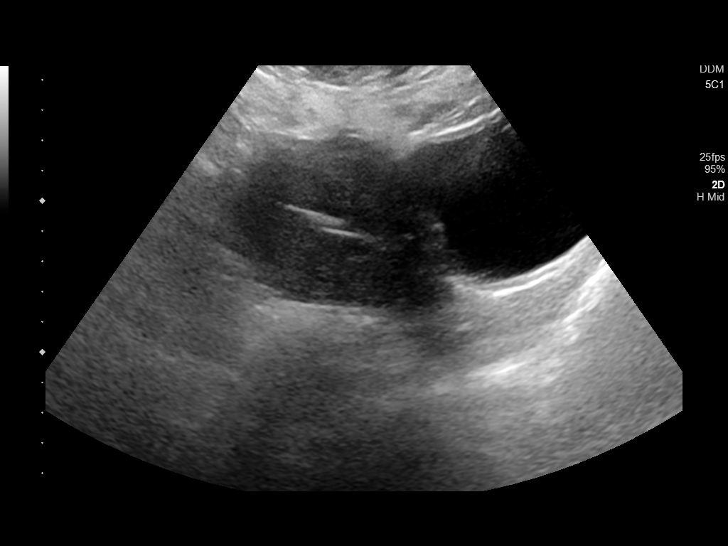
[im 8/96]
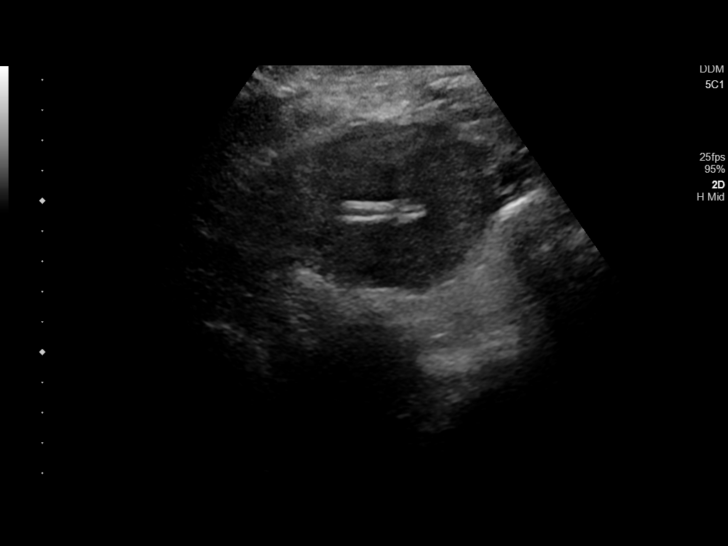
[im 16/96]
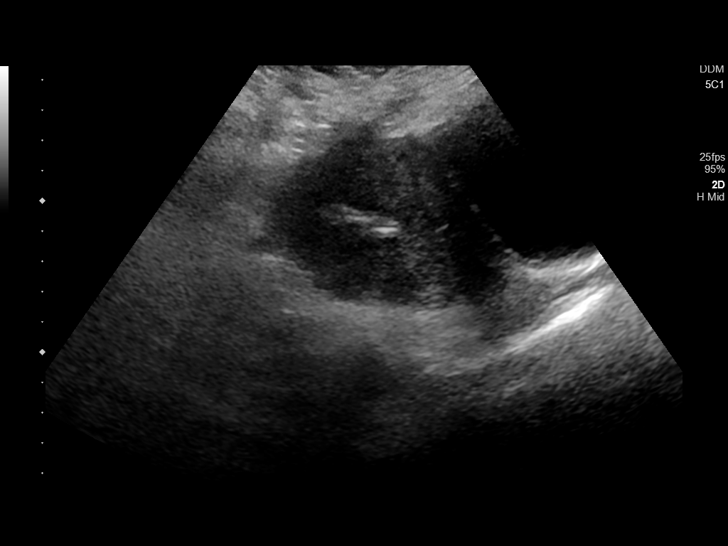
[im 24/96]
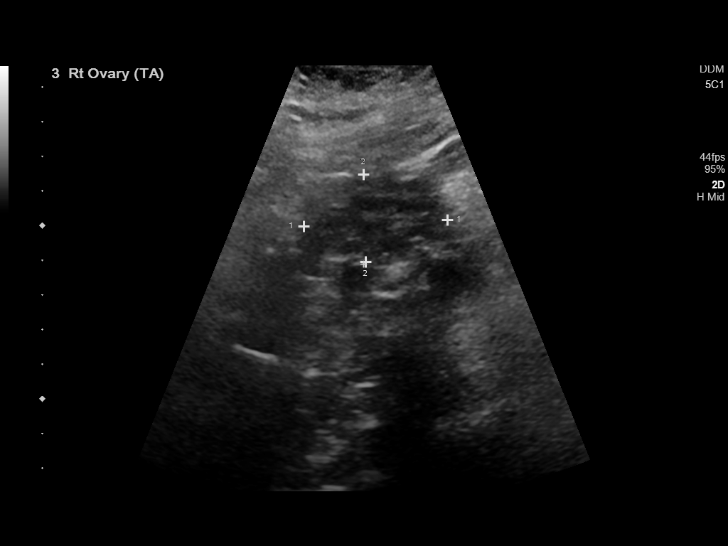
[im 32/96]
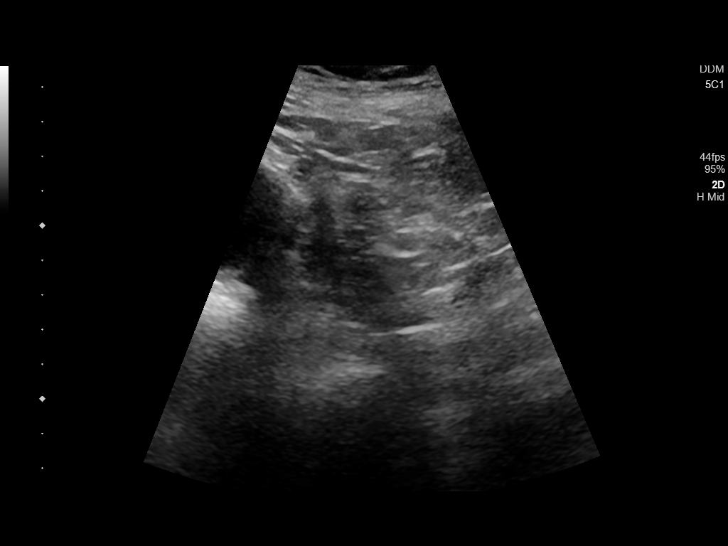
[im 36/96]
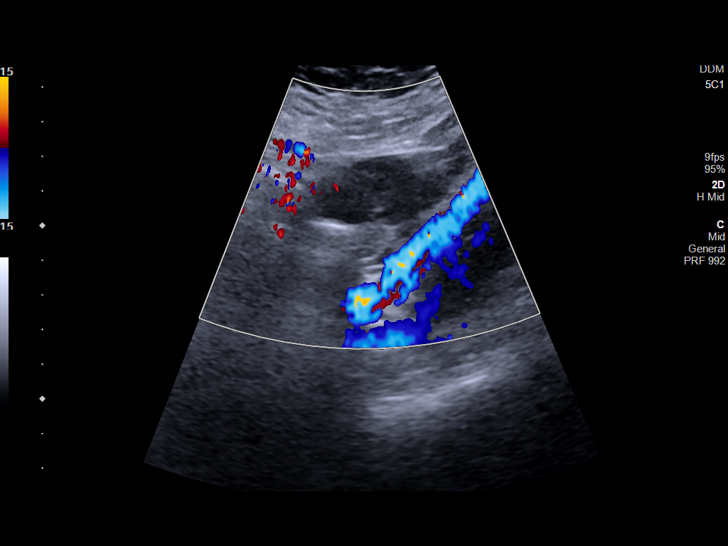
[im 44/96]
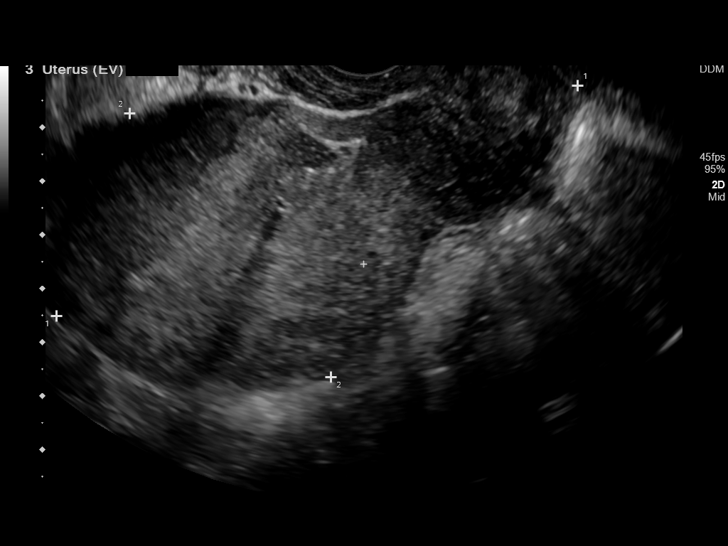
[im 52/96]
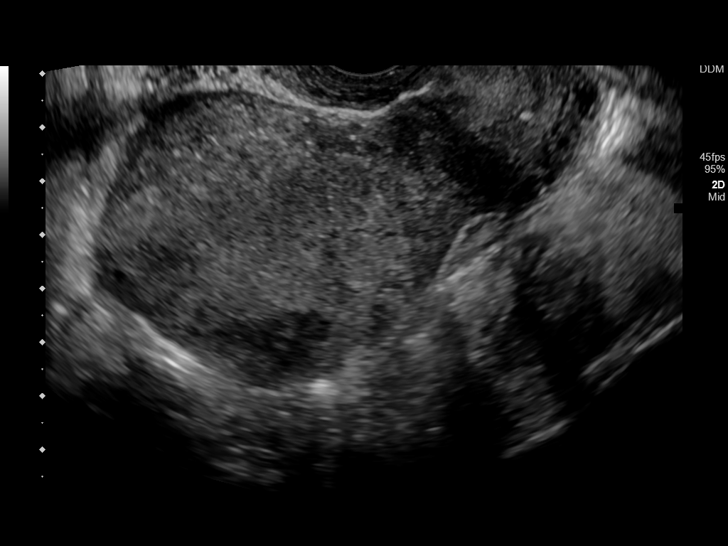
[im 60/96]
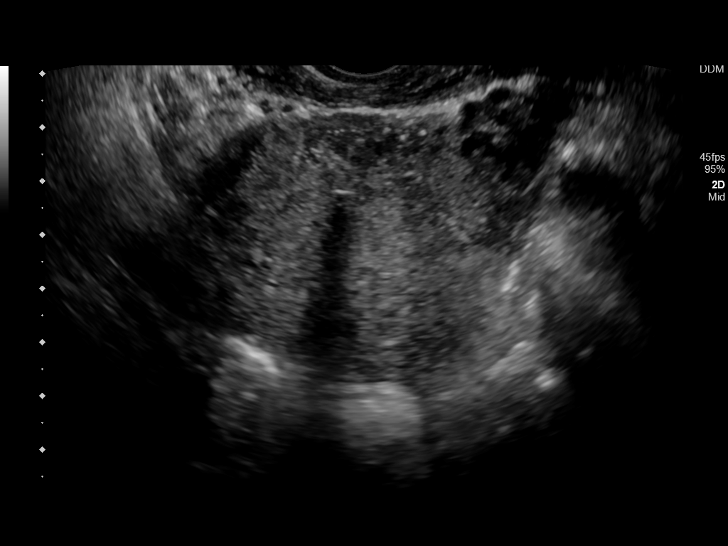
[im 64/96]
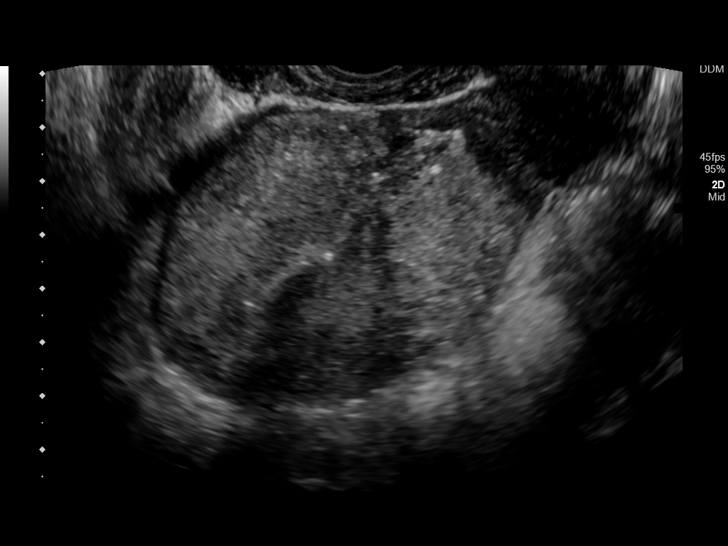
[im 72/96]
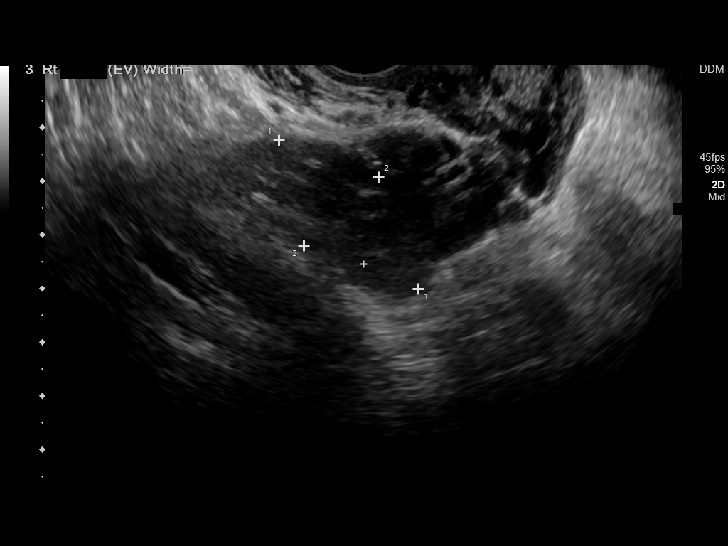
[im 80/96]
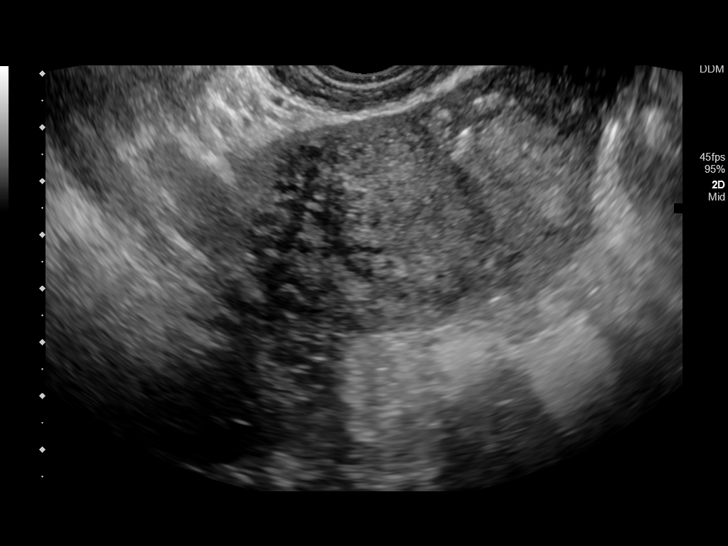
[im 88/96]
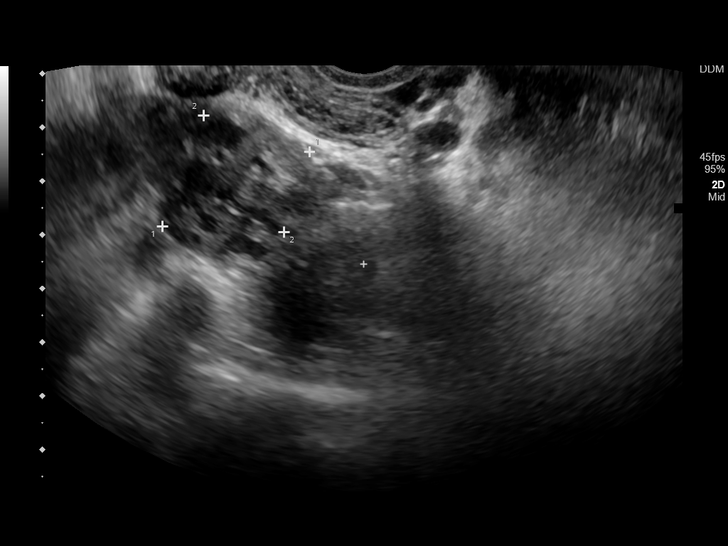
[im 96/96]
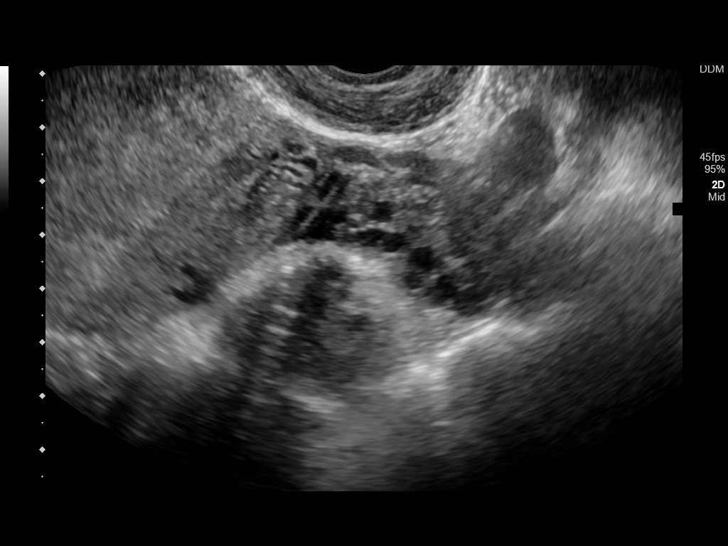

[14 of 25 positions shown; findings below may reference images not displayed]

FINDINGS: Uterus

Measurements: 10.6 x 6.2 x 7.5 cm = volume: 256 mL. Anteverted.
Normal morphology without mass

Endometrium

Thickness: 3 mm. IUD identified within endometrial canal at upper
uterine segment. No endometrial fluid.

Right ovary

Measurements: 3.8 x 1.9 x 2.3 cm = volume: 8.7 mL. Normal morphology
without mass

Left ovary

Measurements: 3.1 x 2.6 x 3.1 cm = volume: 13.3 mL. Normal
morphology without mass

Other findings

No free pelvic fluid or adnexal masses.
IMPRESSION: IUD seen within endometrial canal at upper uterine segment.

Otherwise negative exam.

## 2021-11-10 ENCOUNTER — Other Ambulatory Visit: Payer: Self-pay

## 2021-11-10 ENCOUNTER — Emergency Department: Payer: Self-pay

## 2021-11-10 ENCOUNTER — Emergency Department
Admission: EM | Admit: 2021-11-10 | Discharge: 2021-11-10 | Disposition: A | Discharge status: Home / Self Care | Payer: Self-pay | Attending: Emergency Medicine | Admitting: Emergency Medicine

## 2021-11-10 DIAGNOSIS — Y732 Prosthetic and other implants, materials and accessory gastroenterology and urology devices associated with adverse incidents: Secondary | ICD-10-CM | POA: Insufficient documentation

## 2021-11-10 DIAGNOSIS — Z21 Asymptomatic human immunodeficiency virus [HIV] infection status: Secondary | ICD-10-CM | POA: Insufficient documentation

## 2021-11-10 DIAGNOSIS — N83202 Unspecified ovarian cyst, left side: Secondary | ICD-10-CM

## 2021-11-10 DIAGNOSIS — T8332XA Displacement of intrauterine contraceptive device, initial encounter: Secondary | ICD-10-CM

## 2021-11-10 LAB — CBC WITH DIFFERENTIAL/PLATELET
Abs Immature Granulocytes: 0.01 10*3/uL (ref 0.00–0.07)
Basophils Absolute: 0 10*3/uL (ref 0.0–0.1)
Basophils Relative: 0 %
Eosinophils Absolute: 0.1 10*3/uL (ref 0.0–0.5)
Eosinophils Relative: 1 %
HCT: 39.1 % (ref 36.0–46.0)
Hemoglobin: 12.6 g/dL (ref 12.0–15.0)
Immature Granulocytes: 0 %
Lymphocytes Relative: 48 %
Lymphs Abs: 2.8 10*3/uL (ref 0.7–4.0)
MCH: 26.1 pg (ref 26.0–34.0)
MCHC: 32.2 g/dL (ref 30.0–36.0)
MCV: 81 fL (ref 80.0–100.0)
Monocytes Absolute: 0.5 10*3/uL (ref 0.1–1.0)
Monocytes Relative: 9 %
Neutro Abs: 2.4 10*3/uL (ref 1.7–7.7)
Neutrophils Relative %: 42 %
Platelets: 330 10*3/uL (ref 150–400)
RBC: 4.83 MIL/uL (ref 3.87–5.11)
RDW: 13.6 % (ref 11.5–15.5)
WBC: 5.7 10*3/uL (ref 4.0–10.5)
nRBC: 0 % (ref 0.0–0.2)

## 2021-11-10 LAB — URINALYSIS, ROUTINE W REFLEX MICROSCOPIC
Bilirubin Urine: NEGATIVE
Glucose, UA: NEGATIVE mg/dL
Ketones, ur: NEGATIVE mg/dL
Leukocytes,Ua: NEGATIVE
Nitrite: NEGATIVE
Protein, ur: NEGATIVE mg/dL
Specific Gravity, Urine: 1.009 (ref 1.005–1.030)
pH: 5 (ref 5.0–8.0)

## 2021-11-10 LAB — COMPREHENSIVE METABOLIC PANEL
ALT: 17 U/L (ref 0–44)
AST: 19 U/L (ref 15–41)
Albumin: 3.3 g/dL — ABNORMAL LOW (ref 3.5–5.0)
Alkaline Phosphatase: 38 U/L (ref 38–126)
Anion gap: 6 (ref 5–15)
BUN: 15 mg/dL (ref 6–20)
CO2: 25 mmol/L (ref 22–32)
Calcium: 9 mg/dL (ref 8.9–10.3)
Chloride: 104 mmol/L (ref 98–111)
Creatinine, Ser: 0.77 mg/dL (ref 0.44–1.00)
GFR, Estimated: >60 mL/min (ref >60)
Glucose, Bld: 81 mg/dL (ref 70–99)
Potassium: 3.9 mmol/L (ref 3.5–5.1)
Sodium: 135 mmol/L (ref 135–145)
Total Bilirubin: 0.6 mg/dL (ref 0.3–1.2)
Total Protein: 8.7 g/dL — ABNORMAL HIGH (ref 6.5–8.1)

## 2021-11-10 LAB — POC URINE PREG, ED: Preg Test, Ur: NEGATIVE

## 2021-11-10 LAB — LIPASE, BLOOD: Lipase: 27 U/L (ref 11–51)

## 2021-11-10 MED ORDER — IOHEXOL 300 MG/ML  SOLN
100.0000 mL | Freq: Once | INTRAMUSCULAR | Status: AC | PRN
  Administered 2021-11-10: 100 mL via INTRAVENOUS

## 2021-11-10 MED ORDER — LACTATED RINGERS IV BOLUS
1000.0000 mL | Freq: Once | INTRAVENOUS | Status: AC
Start: 2021-11-10 — End: 2021-11-10
  Administered 2021-11-10: 1000 mL via INTRAVENOUS

## 2021-11-10 MED ORDER — KETOROLAC TROMETHAMINE 30 MG/ML IJ SOLN
15.0000 mg | Freq: Once | INTRAMUSCULAR | Status: AC
  Administered 2021-11-10: 15 mg via INTRAVENOUS
  Filled 2021-11-10: qty 1

## 2021-11-10 NOTE — Discharge Instructions (Signed)
As we discussed, the IUD in your uterus is upside down.  This inappropriate positioning may be causing her pain. ? ?You also have a fairly large, 6 cm, cyst on your left ovary.  This puts you at risk for ovarian torsion.  This is when the ovary twists on itself, blocking its own blood supply.  This would cause severe and sudden pain, often throwing up. ? ?If this happens, any severe pain that does not go away, then please return to the ED. ? ?Otherwise, please follow-up with the OB/GYN in the clinic to discuss removal of your IUD, and management of your large left ovarian cyst. ?

## 2021-11-10 NOTE — ED Provider Notes (Signed)
? ?[facility] ?Provider Note ? ? ? Event Date/Time  ? First MD Initiated Contact with Patient 11/10/21 2021   ?  (approximate) ? ? ?History  ? ?Abdominal Pain ? ? ?HPI ? ?Terri Padilla is a 41 y.o. female who presents to the ED for evaluation of Abdominal Pain ?  ?I reviewed infectious disease clinic visit from 1 year ago.  History of HIV, last CD4 count at that time was around 1300 on ART. ? ?Patient presents to the ED for evaluation of intermittent suprapubic abdominal/midline pelvic pain over the past 1 week.  She reports 2 years of intermittent headache as well as 2 weeks of intermittent deep lower abdominal pain.  Felt nauseous earlier today, but no emesis.  Denies dysuria, diarrhea or stool changes.  Denies vaginal discharge or bleeding.  Reports she has an IUD in place and questions its effectiveness and if this is the source of her pain. ? ? ?Physical Exam  ? ?Triage Vital Signs: ?ED Triage Vitals  ?Enc Vitals Group  ?   BP 11/10/21 1834 104/73  ?   Pulse Rate 11/10/21 1834 86  ?   Resp 11/10/21 1834 18  ?   Temp 11/10/21 1834 97.8 ?F (36.6 ?C)  ?   Temp Source 11/10/21 1834 Oral  ?   SpO2 11/10/21 1834 97 %  ?   Weight 11/10/21 1832 211 lb 10.3 oz (96 kg)  ?   Height 11/10/21 1832 5\' 7"  (1.702 m)  ?   Head Circumference --   ?   Peak Flow --   ?   Pain Score 11/10/21 1832 9  ?   Pain Loc --   ?   Pain Edu? --   ?   Excl. in GC? --   ? ? ?Most recent vital signs: ?Vitals:  ? 11/10/21 1834  ?BP: 104/73  ?Pulse: 86  ?Resp: 18  ?Temp: 97.8 ?F (36.6 ?C)  ?SpO2: 97%  ? ? ?General: Awake, no distress.  Pleasant and conversational.  Looks well ?CV:  Good peripheral perfusion.  ?Resp:  Normal effort.  ?Abd:  No distention.  Mild suprapubic tenderness on deeper palpation, but no peritoneal features.  Abdomen is otherwise benign. ?MSK:  No deformity noted.  ?Neuro:  No focal deficits appreciated. ?Other:   ? ? ?ED Results / Procedures / Treatments  ? ?Labs ?(all labs ordered are  listed, but only abnormal results are displayed) ?Labs Reviewed  ?COMPREHENSIVE METABOLIC PANEL - Abnormal; Notable for the following components:  ?    Result Value  ? Total Protein 8.7 (*)   ? Albumin 3.3 (*)   ? All other components within normal limits  ?URINALYSIS, ROUTINE W REFLEX MICROSCOPIC - Abnormal; Notable for the following components:  ? Color, Urine STRAW (*)   ? APPearance CLEAR (*)   ? Hgb urine dipstick SMALL (*)   ? Bacteria, UA RARE (*)   ? All other components within normal limits  ?LIPASE, BLOOD  ?CBC WITH DIFFERENTIAL/PLATELET  ?POC URINE PREG, ED  ? ? ?EKG ? ? ?RADIOLOGY ?CT abdomen/pelvis reviewed by me with large left ovarian cyst ? ?Official radiology report(s): ?CT ABDOMEN PELVIS W CONTRAST ? ?Result Date: 11/10/2021 ?CLINICAL DATA:  Lower midline abdominopelvic pain. Evaluate for IUD migration. EXAM: CT ABDOMEN AND PELVIS WITH CONTRAST TECHNIQUE: Multidetector CT imaging of the abdomen and pelvis was performed using the standard protocol following bolus administration of intravenous contrast. RADIATION DOSE REDUCTION: This exam was performed according to the  departmental dose-optimization program which includes automated exposure control, adjustment of the mA and/or kV according to patient size and/or use of iterative reconstruction technique. CONTRAST:  OMNIPAQUE IOHEXOL 300 MG/ML  SOLN COMPARISON:  None. FINDINGS: Lower chest: Hypoventilatory changes in the dependent lungs. Tiny hiatal hernia. Hepatobiliary: No focal liver abnormality is seen. No gallstones, gallbladder wall thickening, or biliary dilatation. Pancreas: No ductal dilatation or inflammation. Spleen: Normal in size without focal abnormality. Adrenals/Urinary Tract: Normal adrenal glands. No hydronephrosis or perinephric edema. Homogeneous renal enhancement with symmetric excretion on delayed phase imaging. Urinary bladder is physiologically distended without wall thickening. Stomach/Bowel: Detailed bowel assessment is  limited in the absence of enteric contrast. Stomach is partially distended. Occasional fluid-filled small bowel without obstruction or inflammation. The appendix is well visualized and normal. Moderate volume of stool throughout the colon. There is sigmoid colonic redundancy. No significant diverticular disease. No colonic inflammation Vascular/Lymphatic: Normal caliber abdominal aorta. Patent portal, splenic and mesenteric veins. 11 mm left external iliac node. 13 mm right pelvic sidewall lymph node, there are additional prominent external iliac lymph nodes. Reproductive: The uterus is anteverted. The IUD is in the endometrial canal, however malpositioned, upside down, with the stem extending towards the fundus and T arms proximally. There is a left ovarian cystic lesion that measures 6.3 cm, homogeneous but slightly higher than simple fluid density. The right ovary is tentatively visualized and normal. Other: There is a moderate size complex fat containing umbilical hernia there is a right lower ventral abdominal wall hernia containing only fat. No ascites. No free air. Musculoskeletal: There are no acute or suspicious osseous abnormalities. Left gluteal subcutaneous calcification is typically granuloma. IMPRESSION: 1. The IUD is malpositioned, located in the endometrial canal, however upside down, with the stem extending towards the fundus and T arms proximally. 2. Left ovarian cystic lesion measures 6.3 cm, homogeneous but slightly higher than simple fluid density. Recommend follow-up pelvic US in 3-6 months. Reference: JACR 2020 Feb;17(2):248-254 3. Moderate size complex fat containing umbilical hernia. Right lower ventral abdominal wall hernia containing only fat. 4. Moderate colonic stool burden with sigmoid colonic redundancy, suggesting constipation. Electronically Signed   By: Narda Rutherford M.D.   On: 11/10/2021 21:09   ? ?PROCEDURES and INTERVENTIONS: ? ?Procedures ? ?Medications  ?lactated ringers  bolus 1,000 mL (1,000 mLs Intravenous New Bag/Given 11/10/21 2107)  ?ketorolac (TORADOL) 30 MG/ML injection 15 mg (15 mg Intravenous Given 11/10/21 2106)  ?iohexol (OMNIPAQUE) 300 MG/ML solution 100 mL (100 mLs Intravenous Contrast Given 11/10/21 2048)  ? ? ? ?IMPRESSION / MDM / ASSESSMENT AND PLAN / ED COURSE  ?I reviewed the triage vital signs and the nursing notes. ? ?41 year old female presents to the ED with a week of intermittent suprapubic pain, possibly due to migration of her IUD, found to also have a fairly large ovarian cyst but without signs of torsion and suitable for outpatient management with OB/GYN.  She look systemically well to me and has some very mild suprapubic abdominal tenderness prior to Toradol, benign of LLQ abdomen.  Her blood work is benign without signs of leukocytosis, metabolic derangements, sepsis, UTI.  CT obtained and with evidence of malpositioned IUD, likely the source of her intermittent subacute pain.  Clinically she does not have ovarian torsion and I see no indication for pelvic ultrasound in addition to the CT scan.  We discussed close follow-up with OB/GYN.  We discussed what ovarian torsion may look like from a symptomatic perspective and return precautions for the ED.  She is suitable for outpatient management. ? ?Clinical Course as of 11/10/21 2152  ?Wynelle Link Nov 10, 2021  ?2147 Reassessed and discussed CT results.  She reports feeling fine and does not have much pain right now.  Reexamined her abdomen does not have any left-sided abdominal pain and suprapubic tenderness has resolved after Toradol.  We discussed malpositioned IUD as well as large left ovarian cyst.  We discussed ovarian torsion and what to look out for for this.  We discussed return to the ED with any sudden severe pain.  We discussed following up with OB/GYN. [DS]  ?  ?Clinical Course User Index ?[DS] Delton Prairie, MD  ? ? ? ?FINAL CLINICAL IMPRESSION(S) / ED DIAGNOSES  ? ?Final diagnoses:  ?IUD migration,  initial encounter  ?Left ovarian cyst  ? ? ? ?Rx / DC Orders  ? ?ED Discharge Orders   ? ? None  ? ?  ? ? ? ?Note:  This document was prepared using Dragon voice recognition software and may include unintentional

## 2021-11-10 NOTE — ED Triage Notes (Signed)
Pt here with abd pain/cramping for 3 days. Pt has had a HA for 2 weeks. Pt denies bleeding but states that pain is getting stronger. ?

## 2022-04-30 ENCOUNTER — Emergency Department
Admission: EM | Admit: 2022-04-30 | Discharge: 2022-04-30 | Disposition: A | Discharge status: Home / Self Care | Payer: Self-pay | Attending: Emergency Medicine | Admitting: Emergency Medicine

## 2022-04-30 ENCOUNTER — Other Ambulatory Visit: Payer: Self-pay

## 2022-04-30 DIAGNOSIS — R102 Pelvic and perineal pain: Secondary | ICD-10-CM | POA: Insufficient documentation

## 2022-04-30 DIAGNOSIS — N3000 Acute cystitis without hematuria: Secondary | ICD-10-CM | POA: Insufficient documentation

## 2022-04-30 LAB — WET PREP, GENITAL
Clue Cells Wet Prep HPF POC: NONE SEEN
Sperm: NONE SEEN
Trich, Wet Prep: NONE SEEN
WBC, Wet Prep HPF POC: <10 — AB (ref <10)
Yeast Wet Prep HPF POC: NONE SEEN

## 2022-04-30 LAB — URINALYSIS, ROUTINE W REFLEX MICROSCOPIC
Bilirubin Urine: NEGATIVE
Glucose, UA: NEGATIVE mg/dL
Hgb urine dipstick: NEGATIVE
Ketones, ur: NEGATIVE mg/dL
Nitrite: NEGATIVE
Protein, ur: NEGATIVE mg/dL
Specific Gravity, Urine: 1.002 — ABNORMAL LOW (ref 1.005–1.030)
pH: 7 (ref 5.0–8.0)

## 2022-04-30 LAB — CHLAMYDIA/NGC RT PCR (ARMC ONLY)
Chlamydia Tr: NOT DETECTED
N gonorrhoeae: NOT DETECTED

## 2022-04-30 MED ORDER — CEPHALEXIN 500 MG PO CAPS
500.0000 mg | ORAL_CAPSULE | Freq: Once | ORAL | Status: AC
  Administered 2022-04-30: 500 mg via ORAL
  Filled 2022-04-30: qty 1

## 2022-04-30 MED ORDER — CEPHALEXIN 500 MG PO CAPS: 500.0000 mg | ORAL_CAPSULE | Freq: Two times a day (BID) | ORAL | 0 refills | Status: AC

## 2022-04-30 NOTE — ED Triage Notes (Addendum)
Pt comes with c/o pain and burning with urination. Pt states this started on Saturday. Pt states after sexual intercourse.   Pt states some shooting pains that go up when peeing.

## 2022-04-30 NOTE — ED Provider Notes (Signed)
[facility] Provider Note    Event Date/Time   First MD Initiated Contact with Patient 04/30/22 2118     (approximate)   History   painful urination   HPI  Terri Padilla is a 41 y.o. female with no active medical problems who presents with dysuria and vaginal pain for the last for 5 days.  The patient states that she has vaginal pain during and after intercourse which has been going on for several months.  This is not acutely changed.  However she is now having dysuria, frequency, and urgency that has persisted after intercourse.  She took Azo pills and an antifungal cream with no relief.  She denies any abdominal or flank pain and has no fever or chills.  She has no vaginal discharge or bleeding.  She is sexually active with 1 partner.    Physical Exam   Triage Vital Signs: ED Triage Vitals  Enc Vitals Group     BP 04/30/22 1801 121/74     Pulse Rate 04/30/22 1801 85     Resp 04/30/22 1801 17     Temp 04/30/22 1801 98.2 F (36.8 C)     Temp src --      SpO2 04/30/22 1801 100 %     Weight --      Height --      Head Circumference --      Peak Flow --      Pain Score 04/30/22 1800 4     Pain Loc --      Pain Edu? --      Excl. in GC? --     Most recent vital signs: Vitals:   04/30/22 1801  BP: 121/74  Pulse: 85  Resp: 17  Temp: 98.2 F (36.8 C)  SpO2: 100%     General: Awake, no distress.  CV:  Good peripheral perfusion.  Resp:  Normal effort.  Abd:  Soft nontender.  No distention.  Other:  Normal external genitalia.  No significant discharge.  No CMT or adnexal tenderness.  No blood.   ED Results / Procedures / Treatments   Labs (all labs ordered are listed, but only abnormal results are displayed) Labs Reviewed  WET PREP, GENITAL - Abnormal; Notable for the following components:      Result Value   WBC, Wet Prep HPF POC <10 (*)    All other components within normal limits  URINALYSIS, ROUTINE W REFLEX  MICROSCOPIC - Abnormal; Notable for the following components:   Color, Urine STRAW (*)    APPearance CLEAR (*)    Specific Gravity, Urine 1.002 (*)    Leukocytes,Ua SMALL (*)    Bacteria, UA RARE (*)    All other components within normal limits  CHLAMYDIA/NGC RT PCR ([facility] ONLY)               EKG     RADIOLOGY   PROCEDURES:  Critical Care performed: No  Procedures   MEDICATIONS ORDERED IN ED: Medications  cephALEXin (KEFLEX) capsule 500 mg (500 mg Oral Given 04/30/22 2251)     IMPRESSION / MDM / ASSESSMENT AND PLAN / ED COURSE  I reviewed the triage vital signs and the nursing notes.  42 year old female presents with dysuria, frequency, and urgency as well as some vaginal discomfort over the last 5 days.  Pelvic exam is unremarkable.    Differential diagnosis includes, but is not limited to, UTI, yeast infection, trichomoniasis, BV.  There is no clinical evidence  for PID.  I do not suspect STI.  We will obtain urinalysis, wet prep, GC/CT.  There is no indication for imaging.  Patient's presentation is most consistent with acute complicated illness / injury requiring diagnostic workup.  ----------------------------------------- 11:54 PM on 04/30/2022 -----------------------------------------  Urinalysis is consistent with UTI.  Wet prep and GC/CT swabs were negative.  I counseled the patient on the results of the work-up.  I prescribed Keflex and gave her referral to gynecology for further work-up of vaginal pain.  I gave her strict return precautions and she expressed understanding.   FINAL CLINICAL IMPRESSION(S) / ED DIAGNOSES   Final diagnoses:  Acute cystitis without hematuria  Vaginal pain     Rx / DC Orders   ED Discharge Orders          Ordered    cephALEXin (KEFLEX) 500 MG capsule  2 times daily        04/30/22 2240             Note:  This document was prepared using Dragon voice recognition software and may include unintentional dictation  errors.    Arta Silence, MD 04/30/22 [id number]

## 2022-06-19 ENCOUNTER — Other Ambulatory Visit: Payer: Self-pay | Admitting: Pharmacist

## 2022-06-19 ENCOUNTER — Encounter: Payer: Self-pay | Admitting: Internal Medicine

## 2022-06-19 ENCOUNTER — Ambulatory Visit: Payer: Self-pay

## 2022-06-19 ENCOUNTER — Ambulatory Visit (INDEPENDENT_AMBULATORY_CARE_PROVIDER_SITE_OTHER): Payer: Self-pay | Admitting: Internal Medicine

## 2022-06-19 ENCOUNTER — Other Ambulatory Visit: Payer: Self-pay

## 2022-06-19 VITALS — BP 116/80 | HR 61 | Temp 98.1°F | Wt 188.0 lb

## 2022-06-19 DIAGNOSIS — T8332XA Displacement of intrauterine contraceptive device, initial encounter: Secondary | ICD-10-CM | POA: Insufficient documentation

## 2022-06-19 DIAGNOSIS — Z3202 Encounter for pregnancy test, result negative: Secondary | ICD-10-CM

## 2022-06-19 DIAGNOSIS — B20 Human immunodeficiency virus [HIV] disease: Secondary | ICD-10-CM

## 2022-06-19 DIAGNOSIS — Z23 Encounter for immunization: Secondary | ICD-10-CM

## 2022-06-19 DIAGNOSIS — Z7185 Encounter for immunization safety counseling: Secondary | ICD-10-CM

## 2022-06-19 DIAGNOSIS — Z113 Encounter for screening for infections with a predominantly sexual mode of transmission: Secondary | ICD-10-CM

## 2022-06-19 LAB — POCT URINE PREGNANCY: Preg Test, Ur: NEGATIVE

## 2022-06-19 MED ORDER — BICTEGRAVIR-EMTRICITAB-TENOFOV 50-200-25 MG PO TABS: 1.0000 | ORAL_TABLET | Freq: Every day | ORAL | 11 refills | Status: DC

## 2022-06-19 MED ORDER — BIKTARVY 50-200-25 MG PO TABS: 1.0000 | ORAL_TABLET | Freq: Every day | ORAL | 0 refills | Status: AC

## 2022-06-19 NOTE — Addendum Note (Signed)
Addended by: Juanita Laster on: 06/19/2022 09:36 AM   Modules accepted: Orders

## 2022-06-19 NOTE — Progress Notes (Signed)
Medication Samples have been provided to the patient.  Drug name: Biktarvy        Strength: 50/200/25 mg       Qty: 28 tablets (4 bottles)   LOT: [id number]   Exp.Date: 09/2024  Dosing instructions: Take one tablet by mouth once daily  The patient has been instructed regarding the correct time, dose, and frequency of taking this medication, including desired effects and most common side effects.   Christopher Hink L. Rolinda Impson, PharmD, BCIDP, AAHIVP, CPP Clinical Pharmacist Practitioner Infectious Diseases Clinical Pharmacist [facility] 06/25/2020, 10:07 AM  

## 2022-06-19 NOTE — Progress Notes (Signed)
[facility] for Infectious Disease   CHIEF COMPLAINT    HIV follow up.    SUBJECTIVE:    Terri Padilla is a 41 y.o. female with PMHx as below who presents to the clinic for HIV follow up.   Patient here today for HIV follow up after prolonged absence from clinic.  I saw her as a work in visit in May 2022 at which time she was continued on her ART regimen of Genvoya and Prezista.  She previously has followed up with Dr Baxter Flattery but missed her appointments in August and October.  She reports that she has been off her ART for about 8 months.  She had no side effects while on treatment.  Please see A&P for the details of today's visit and status of the patient's medical problems.   Patient's Medications  New Prescriptions   No medications on file  Previous Medications   DARUNAVIR (PREZISTA) 800 MG TABLET    Take 1 tablet (800 mg total) by mouth daily with breakfast.   GENVOYA 150-150-200-10 MG TABS TABLET    Take 1 tablet by mouth daily with breakfast.  Modified Medications   No medications on file  Discontinued Medications   No medications on file      Past Medical History:  Diagnosis Date   HIV (human immunodeficiency virus infection) ([place])     Social History   Tobacco Use   Smoking status: Never   Smokeless tobacco: Never  Vaping Use   Vaping Use: Never used  Substance Use Topics   Alcohol use: No   Drug use: No    Family History  Problem Relation Age of Onset   Arthritis Mother    Down syndrome Daughter    Asthma Neg Hx    Alcohol abuse Neg Hx    Birth defects Neg Hx    Cancer Neg Hx    COPD Neg Hx    Depression Neg Hx    Diabetes Neg Hx    Drug abuse Neg Hx    Early death Neg Hx    Hearing loss Neg Hx    Heart disease Neg Hx    Hyperlipidemia Neg Hx    Hypertension Neg Hx    Kidney disease Neg Hx    Learning disabilities Neg Hx    Mental illness Neg Hx    Mental retardation Neg Hx    Miscarriages / Stillbirths Neg Hx     Stroke Neg Hx    Vision loss Neg Hx    Varicose Veins Neg Hx     Allergies  Allergen Reactions   Chloroquine Itching    Per Interpreter: Nivaquine (Chloroquine)    Review of Systems  Constitutional: Negative.   Respiratory: Negative.    Cardiovascular: Negative.   Gastrointestinal: Negative.      OBJECTIVE:    Vitals:   06/19/22 0849  BP: 116/80  Pulse: 61  Temp: 98.1 F (36.7 C)  TempSrc: Oral  SpO2: 98%  Weight: 188 lb (85.3 kg)     Body mass index is 29.44 kg/m.  Physical Exam Constitutional:      Appearance: Normal appearance.  HENT:     Head: Normocephalic and atraumatic.  Eyes:     Extraocular Movements: Extraocular movements intact.     Conjunctiva/sclera: Conjunctivae normal.  Pulmonary:     Effort: Pulmonary effort is normal. No respiratory distress.  Abdominal:     General: There is no distension.  Palpations: Abdomen is soft.  Skin:    General: Skin is warm and dry.  Neurological:     General: No focal deficit present.     Mental Status: She is alert and oriented to person, place, and time.  Psychiatric:        Mood and Affect: Mood normal.        Behavior: Behavior normal.     Labs and Microbiology:    Latest Ref Rng & Units 11/10/2021    6:35 PM 09/12/2021    4:19 PM 11/14/2020    5:00 PM  CMP  Glucose 70 - 99 mg/dL 81  131  95   BUN 6 - 20 mg/dL 15  9  11    Creatinine 0.44 - 1.00 mg/dL 0.77  0.63  0.68   Sodium 135 - 145 mmol/L 135  133  138   Potassium 3.5 - 5.1 mmol/L 3.9  3.7  4.3   Chloride 98 - 111 mmol/L 104  103  106   CO2 22 - 32 mmol/L 25  25  24    Calcium 8.9 - 10.3 mg/dL 9.0  9.0  9.0   Total Protein 6.5 - 8.1 g/dL 8.7   6.7   Total Bilirubin 0.3 - 1.2 mg/dL 0.6   0.2   Alkaline Phos 38 - 126 U/L 38     AST 15 - 41 U/L 19   12   ALT 0 - 44 U/L 17   13       Latest Ref Rng & Units 11/10/2021    6:35 PM 09/12/2021    4:19 PM 11/14/2020    5:00 PM  CBC  WBC 4.0 - 10.5 K/uL 5.7  7.6  8.3   Hemoglobin 12.0 - 15.0 g/dL  12.6  12.2  12.5   Hematocrit 36.0 - 46.0 % 39.1  38.0  38.4   Platelets 150 - 400 K/uL 330  347  314      Lab Results  Component Value Date   HIV1RNAQUANT 32 (H) 11/14/2020   HIV1RNAQUANT <20 NOT DETECTED 08/31/2019   HIV1RNAQUANT <20 NOT DETECTED 03/17/2019   CD4TABS 1,322 11/14/2020   CD4TABS 1,525 12/27/2018   CD4TABS 1,570 04/21/2018    RPR and STI: Lab Results  Component Value Date   LABRPR NON REACTIVE 03/23/2019   LABRPR Non Reactive 01/06/2019   LABRPR NON-REACTIVE 12/27/2018   LABRPR Non Reactive 11/10/2018   LABRPR NON-REACTIVE 12/08/2017    STI Results GC CT  11/14/2020  4:15 PM Negative  Negative   03/08/2019 12:00 AM Negative  Negative   10/29/2018 12:00 AM Negative  Negative   12/08/2017 12:00 AM Negative  Negative   05/28/2016 12:00 AM Negative  Negative   03/04/2016 12:00 AM Negative  Negative   02/25/2016 12:00 AM Negative  Negative   02/21/2016 12:00 AM Negative  Negative     Hepatitis B: Lab Results  Component Value Date   HEPBSAB NEG 03/04/2016   HEPBSAG Negative 11/10/2018   HEPBCAB NON REACTIVE 03/04/2016   Hepatitis C: No results found for: "HEPCAB", "HCVRNAPCRQN" Hepatitis A: Lab Results  Component Value Date   HAV REACTIVE (A) 03/04/2016   Lipids: Lab Results  Component Value Date   CHOL 183 12/08/2017   TRIG 63 12/08/2017   HDL 47 (L) 12/08/2017   CHOLHDL 3.9 12/08/2017   VLDL 39 (H) 03/04/2016   LDLCALC 121 (H) 12/08/2017       ASSESSMENT & PLAN:    HIV disease ([place]) She has  been off ART for about 8 months now.  Previously on [place].  Per Dr Feliz Beam last note, she had intended for patient to be on Genvoya alone.  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 Discussed with patient and will switch to [facility] today which should be adequate for her mutation history.  Samples provided and will send refills to her pharmacy.  Check labs at this time and follow up again in 4-6 weeks  for repeat viral load.   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  She is not immune to hepatitis B.  Will give dose #1 today and again in 4 weeks.  Will give flu shot today and check HCV Ab today as well.   Routine screening for STI (sexually transmitted infection) Screening offered today and she is agreeable.  Check GC/CT, RPR.   Malpositioned IUD Patient with malpositioned IUD noted in April 2023 ED visit.  She was advised to follow up with OB/GYN at that time but has not done so as of yet.  Will place referral today.  Check urine pregnancy with starting Biktarvy and report of missed period.   Orders Placed This Encounter  Procedures   HIV-1 RNA quant-no reflex-bld   T-helper cell (CD4)- ([facility] clinic only)   RPR   COMPLETE METABOLIC PANEL WITH GFR   CBC   HIV-1 Genotyping (RTI,[facility])   Pregnancy, urine   Hepatitis C antibody   Ambulatory referral to Obstetrics / Gynecology    Referral Priority:   Routine    Referral Type:   Consultation    Referral Reason:   Specialty Services Required    Requested Specialty:   Obstetrics and Gynecology    Number of Visits Requested:   1      Lady Deutscher [facility] 06/19/2022, 9:17 AM   I have personally spent 40 minutes involved in face-to-face and non-face-to-face activities for this patient on the day of the visit. Professional time spent includes the following activities: Preparing to see the patient (review of tests), Obtaining and/or reviewing separately obtained history (admission/discharge record), Performing a medically appropriate examination and/or evaluation , Ordering medications/tests/procedures, referring and communicating with other health care professionals, Documenting clinical information in the EMR, Independently interpreting results (not separately reported), Communicating results to the patient/family/caregiver, Counseling and  educating the patient/family/caregiver and Care coordination (not separately reported).

## 2022-06-19 NOTE — Assessment & Plan Note (Signed)
Patient with malpositioned IUD noted in April 2023 ED visit.  She was advised to follow up with OB/GYN at that time but has not done so as of yet.  Will place referral today.  Check urine pregnancy with starting Biktarvy and report of missed period.

## 2022-06-19 NOTE — Assessment & Plan Note (Addendum)
She has been off ART for about 8 months now.  Previously on [place].  Per Dr Feliz Beam last note, she had intended for patient to be on Genvoya alone.  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 Discussed with patient and will switch to [facility] today which should be adequate for her mutation history.  Samples provided and will send refills to her pharmacy.  Check labs at this time and follow up again in 4-6 weeks for repeat viral load.

## 2022-06-19 NOTE — Assessment & Plan Note (Signed)
Screening offered today and she is agreeable.  Check GC/CT, RPR.

## 2022-06-19 NOTE — Assessment & Plan Note (Signed)
Prior labs indicate she is immune to hepatitis A.  She is not immune to hepatitis B.  Will give dose #1 today and again in 4 weeks.  Will give flu shot today and check HCV Ab today as well.

## 2022-06-20 LAB — URINE CYTOLOGY ANCILLARY ONLY
Chlamydia: NEGATIVE
Comment: NEGATIVE
Comment: NORMAL
Neisseria Gonorrhea: NEGATIVE

## 2022-06-20 LAB — T-HELPER CELL (CD4) - (RCID CLINIC ONLY)
CD4 % Helper T Cell: 33 % (ref 33–65)
CD4 T Cell Abs: 712 /uL (ref 400–1790)

## 2022-06-23 ENCOUNTER — Telehealth: Payer: Self-pay

## 2022-06-23 NOTE — Telephone Encounter (Signed)
-----   Message from Kathlynn Grate, DO sent at 06/23/2022 10:00 AM EST ----- Can you please reach out to patient to see about getting her scheduled for follow up in about 6 weeks and confirm she has been taking her Biktarvy?  Thanks, Greig Castilla

## 2022-06-23 NOTE — Telephone Encounter (Addendum)
Called pt to relay message below, vm not setup on cell number and home number is husband number unable to give info to him not on her dpr and he stated that she speaks english and he will tell her to call our office back.

## 2022-06-25 NOTE — Telephone Encounter (Signed)
Patient is scheduled for 08/06/22 and she stated she is taking her Biktarvy.

## 2022-06-28 LAB — HEPATITIS C ANTIBODY: Hepatitis C Ab: NONREACTIVE

## 2022-06-28 LAB — HIV-1 GENOTYPING (RTI,PI,IN INHBTR): HIV-1 Genotype: DETECTED — AB

## 2022-06-28 LAB — CBC
HCT: 36.3 % (ref 35.0–45.0)
Hemoglobin: 11.8 g/dL (ref 11.7–15.5)
MCH: 26.5 pg — ABNORMAL LOW (ref 27.0–33.0)
MCHC: 32.5 g/dL (ref 32.0–36.0)
MCV: 81.4 fL (ref 80.0–100.0)
MPV: 11 fL (ref 7.5–12.5)
Platelets: 307 10*3/uL (ref 140–400)
RBC: 4.46 10*6/uL (ref 3.80–5.10)
RDW: 13.3 % (ref 11.0–15.0)
WBC: 4.7 10*3/uL (ref 3.8–10.8)

## 2022-06-28 LAB — COMPLETE METABOLIC PANEL WITH GFR
AG Ratio: 0.5 (calc) — ABNORMAL LOW (ref 1.0–2.5)
ALT: 18 U/L (ref 6–29)
AST: 26 U/L (ref 10–30)
Albumin: 3.3 g/dL — ABNORMAL LOW (ref 3.6–5.1)
Alkaline phosphatase (APISO): 34 U/L (ref 31–125)
BUN/Creatinine Ratio: 9 (calc) (ref 6–22)
BUN: 5 mg/dL — ABNORMAL LOW (ref 7–25)
CO2: 25 mmol/L (ref 20–32)
Calcium: 8.6 mg/dL (ref 8.6–10.2)
Chloride: 103 mmol/L (ref 98–110)
Creat: 0.57 mg/dL (ref 0.50–0.99)
Globulin: 6.1 g/dL (calc) — ABNORMAL HIGH (ref 1.9–3.7)
Glucose, Bld: 86 mg/dL (ref 65–99)
Potassium: 4.1 mmol/L (ref 3.5–5.3)
Sodium: 133 mmol/L — ABNORMAL LOW (ref 135–146)
Total Bilirubin: 0.2 mg/dL (ref 0.2–1.2)
Total Protein: 9.4 g/dL — ABNORMAL HIGH (ref 6.1–8.1)
eGFR: 117 mL/min/{1.73_m2} (ref >=60)

## 2022-06-28 LAB — HIV-1 RNA QUANT-NO REFLEX-BLD
HIV 1 RNA Quant: 14500 Copies/mL — ABNORMAL HIGH
HIV-1 RNA Quant, Log: 4.16 Log cps/mL — ABNORMAL HIGH

## 2022-06-28 LAB — RPR: RPR Ser Ql: NONREACTIVE

## 2022-07-01 ENCOUNTER — Encounter: Payer: Self-pay | Admitting: Internal Medicine

## 2022-07-18 NOTE — Progress Notes (Unsigned)
GYNECOLOGY OFFICE VISIT NOTE  History:   Terri Padilla is a 42 y.o. [id number] here today for malpositioned IUD and pain/itching after intercourse.   She uses lubricants when sexually active. Her husband is a Administrator and gone during the week so they have intercourse weekly. After intercourse she has pain and then she starts to have itching the following day. She has tried over the counter medication and once went to the ED.  She was seen in October. At that time her cultures were negative and her UA was possible infection so she was treated for 5 days with ABX and felt better after that.    She is also here for IUD removal due to malposition. It was originally placed on 03/25/2019 at the time of her last c-section. IUD is upside down per CT on 11/10/21.   Last pap smear was on 10/2018 and was normal, HPV negative. She would be due 10/2023. She needs referral to BCCCP. Discussed other birth control options - pt is self-pay.    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   Health Maintenance:   Diagnosis  Date Value Ref Range Status  11/10/2018   Final   NEGATIVE FOR INTRAEPITHELIAL LESIONS OR MALIGNANCY.     Not yet had mammogram. Will refer to BCCCP  Review of Systems:  Pertinent items noted in HPI and remainder of comprehensive ROS otherwise negative.  Physical Exam:  BP 114/81   Pulse 74   Wt 185 lb (83.9 kg)   BMI 28.98 kg/m  CONSTITUTIONAL: Well-developed, well-nourished female in no acute distress.  HEENT:  Normocephalic, atraumatic. External right and left ear normal. No scleral icterus.  NECK: Normal range of motion, supple, no masses noted on observation SKIN: No rash noted. Not diaphoretic. No erythema. No pallor. MUSCULOSKELETAL: Normal range of motion. No edema noted. NEUROLOGIC: Alert and oriented to person, place, and time. Normal muscle tone coordination. No cranial nerve deficit noted. PSYCHIATRIC: Normal mood and affect. Normal behavior. Normal judgment and thought content.  PELVIC: Normal appearing external genitalia; normal urethral meatus; normal appearing vaginal mucosa and cervix.  No abnormal discharge noted.  Normal uterine size, no other palpable masses, no uterine or adnexal tenderness. Performed in the presence of a chaperone  GYNECOLOGY OFFICE PROCEDURE NOTE  Terri Padilla is a 42 y.o. [id number] here for IUD removal due to malposition.   IUD Removal  Patient identified, informed consent performed, consent signed.  Patient was in the dorsal lithotomy position, normal external genitalia was noted.  A speculum was placed in the patient's vagina, normal discharge was noted, no lesions. The cervix was visualized, no lesions, no abnormal discharge.  The cervix was prepped with  betadine. Paracervical block done due to patient discomfort. Single tooth tenaculum applied. Os finders used to open cervix. Uterus slightly retroverted.  Long kelly used to try to grasp strings but not successul. IUD hook used x2 attempts. Long kelly then inserted and strings of the IUD were grasped and pulled. The IUD was removed in its entirety.   Patient tolerated the procedure well.     Patient will use OCPs for contraception. Assessment and Plan:   1. Malpositioned intrauterine device (IUD), initial encounter - Discussed option to leave it in. She would like it removed. See procedure note above but it was removed successfully.  - Reviewed she can become pregnant during the time it is removed and to avoid intercourse or use condoms in that interim. She can also have new IUD placed through the [facility] asap. We discussed she can do other forms of birth control as well. She would like to do OCPs. No C/I to combined OCP. Sent to pharmacy.  - Postprocedure ibuprofen given.   2. Acute vaginitis Patient had testing at the time of symptoms and cultures negative. We will do empiric suppression with weekly diflucan for 3 months.   She does need a pap smear ASAP due to history of HIV. She also needs MXR. Referral to BCCCP placed.    Routine preventative health maintenance measures emphasized. Please refer to After Visit Summary for other counseling recommendations.   Return for annual.  Radene Gunning, MD, [place] for [employer], [place]

## 2022-07-21 ENCOUNTER — Encounter: Payer: Self-pay | Admitting: Obstetrics and Gynecology

## 2022-07-21 ENCOUNTER — Ambulatory Visit (INDEPENDENT_AMBULATORY_CARE_PROVIDER_SITE_OTHER): Payer: Self-pay | Admitting: Obstetrics and Gynecology

## 2022-07-21 VITALS — BP 114/81 | HR 74 | Wt 185.0 lb

## 2022-07-21 DIAGNOSIS — T8332XA Displacement of intrauterine contraceptive device, initial encounter: Secondary | ICD-10-CM

## 2022-07-21 DIAGNOSIS — N760 Acute vaginitis: Secondary | ICD-10-CM

## 2022-07-21 MED ORDER — NORETHIN ACE-ETH ESTRAD-FE 1-20 MG-MCG PO TABS: 1.0000 | ORAL_TABLET | Freq: Every day | ORAL | 3 refills | Status: DC

## 2022-07-21 MED ORDER — IBUPROFEN 800 MG PO TABS
800.0000 mg | ORAL_TABLET | Freq: Once | ORAL | Status: AC
  Administered 2022-07-21: 800 mg via ORAL

## 2022-07-21 MED ORDER — FLUCONAZOLE 150 MG PO TABS: 150.0000 mg | ORAL_TABLET | ORAL | 0 refills | Status: AC

## 2022-07-21 NOTE — Addendum Note (Signed)
Addended byMariane Baumgarten on: 07/21/2022 11:36 AM   Modules accepted: Orders

## 2022-08-06 ENCOUNTER — Ambulatory Visit: Payer: Self-pay | Admitting: Internal Medicine

## 2022-08-06 NOTE — Progress Notes (Incomplete)
[facility]   CHIEF COMPLAINT    HIV follow up.    SUBJECTIVE:    Terri Padilla is a 42 y.o. female with PMHx as below who presents to the clinic for HIV follow up.   Please see A&P for the details of today's visit and status of the patient's medical problems.   Patient's Medications  New Prescriptions   No medications on file  Previous Medications   BICTEGRAVIR-EMTRICITABINE-TENOFOVIR AF (BIKTARVY) 50-200-25 MG TABS TABLET    Take 1 tablet by mouth daily.   FLUCONAZOLE (DIFLUCAN) 150 MG TABLET    Take 1 tablet (150 mg total) by mouth once a week.   NORETHINDRONE-ETHINYL ESTRADIOL-FE (JUNEL FE 1/20) 1-20 MG-MCG TABLET    Take 1 tablet by mouth daily.  Modified Medications   No medications on file  Discontinued Medications   No medications on file      Past Medical History:  Diagnosis Date  . HIV (human immunodeficiency virus infection) ([place])     Social History   Tobacco Use  . Smoking status: Never  . Smokeless tobacco: Never  Vaping Use  . Vaping Use: Never used  Substance Use Topics  . Alcohol use: No  . Drug use: No    Family History  Problem Relation Age of Onset  . Arthritis Mother   . Down syndrome Daughter   . Asthma Neg Hx   . Alcohol abuse Neg Hx   . Birth defects Neg Hx   . Cancer Neg Hx   . COPD Neg Hx   . Depression Neg Hx   . Diabetes Neg Hx   . Drug abuse Neg Hx   . Early death Neg Hx   . Hearing loss Neg Hx   . Heart disease Neg Hx   . Hyperlipidemia Neg Hx   . Hypertension Neg Hx   . Kidney disease Neg Hx   . Learning disabilities Neg Hx   . Mental illness Neg Hx   . Mental retardation Neg Hx   . Miscarriages / Stillbirths Neg Hx   . Stroke Neg Hx   . Vision loss Neg Hx   . Varicose Veins Neg Hx     Allergies  Allergen Reactions  . Chloroquine Itching    Per Interpreter: Nivaquine (Chloroquine)    ROS   OBJECTIVE:    There were no vitals filed for this visit.   There is  no height or weight on file to calculate BMI.  Physical Exam  Labs and Microbiology:    Latest Ref Rng & Units 06/19/2022    9:25 AM 11/10/2021    6:35 PM 09/12/2021    4:19 PM  CMP  Glucose 65 - 99 mg/dL 86  81  131   BUN 7 - 25 mg/dL 5  15  9    Creatinine 0.50 - 0.99 mg/dL 0.57  0.77  0.63   Sodium 135 - 146 mmol/L 133  135  133   Potassium 3.5 - 5.3 mmol/L 4.1  3.9  3.7   Chloride 98 - 110 mmol/L 103  104  103   CO2 20 - 32 mmol/L 25  25  25    Calcium 8.6 - 10.2 mg/dL 8.6  9.0  9.0   Total Protein 6.1 - 8.1 g/dL 9.4  8.7    Total Bilirubin 0.2 - 1.2 mg/dL 0.2  0.6    Alkaline Phos 38 - 126 U/L  38    AST  10 - 30 U/L 26  19    ALT 6 - 29 U/L 18  17        Latest Ref Rng & Units 06/19/2022    9:25 AM 11/10/2021    6:35 PM 09/12/2021    4:19 PM  CBC  WBC 3.8 - 10.8 Thousand/uL 4.7  5.7  7.6   Hemoglobin 11.7 - 15.5 g/dL 51.8  84.1  66.0   Hematocrit 35.0 - 45.0 % 36.3  39.1  38.0   Platelets 140 - 400 Thousand/uL 307  330  347      Lab Results  Component Value Date   HIV1RNAQUANT 14,500 (H) 06/19/2022   HIV1RNAQUANT 32 (H) 11/14/2020   HIV1RNAQUANT <20 NOT DETECTED 08/31/2019   CD4TABS 712 06/19/2022   CD4TABS 1,322 11/14/2020   CD4TABS 1,525 12/27/2018    RPR and STI: Lab Results  Component Value Date   LABRPR NON-REACTIVE 06/19/2022   LABRPR NON REACTIVE 03/23/2019   LABRPR Non Reactive 01/06/2019   LABRPR NON-REACTIVE 12/27/2018   LABRPR Non Reactive 11/10/2018    STI Results GC CT  06/19/2022  9:05 AM Negative  Negative   11/14/2020  4:15 PM Negative  Negative   03/08/2019 12:00 AM Negative  Negative   10/29/2018 12:00 AM Negative  Negative   12/08/2017 12:00 AM Negative  Negative   05/28/2016 12:00 AM Negative  Negative   03/04/2016 12:00 AM Negative  Negative   02/25/2016 12:00 AM Negative  Negative   02/21/2016 12:00 AM Negative  Negative     Hepatitis B: Lab Results  Component Value Date   HEPBSAB NEG 03/04/2016   HEPBSAG Negative 11/10/2018    HEPBCAB NON REACTIVE 03/04/2016   Hepatitis C: Lab Results  Component Value Date   HEPCAB NON-REACTIVE 06/19/2022   Hepatitis A: Lab Results  Component Value Date   HAV REACTIVE (A) 03/04/2016   Lipids: Lab Results  Component Value Date   CHOL 183 12/08/2017   TRIG 63 12/08/2017   HDL 47 (L) 12/08/2017   CHOLHDL 3.9 12/08/2017   VLDL 39 (H) 03/04/2016   LDLCALC 121 (H) 12/08/2017    Imaging: ***   ASSESSMENT & PLAN:    No problem-specific Assessment & Plan notes found for this encounter.   No orders of the defined types were placed in this encounter.      *** Vaccines Influenza: give every year COVID: recommend vaccination if not already done Prevnar 20: Give x 1 if no prior pneumonia vaccine.    - If only 1 dose of either PPSV 23 OR PCV 13 ----> give PCV 20 if > 1 year since last vaccine  - If received both PPSV 23 AND PCV 13 ----> give PCV 20 if > 5 years since last vaccine.  If < 5 years then wait to give PCV 20 Pneumovax-23: (if CD4 >200) give twice every 5 years apart before age 68, then once at age 35.  Give >8 weeks from [place] Prevnar-13: (preferably when CD4 >200) give once, give >1 year from last Pneumovax-23 Hepatitis A: give Havrix 2 dose series at 0 and 6-12 months if non-immune Hepatitis B: give Heplisav 2 dose series at 0 and 4 weeks if non-immune.  Repeat serology 2 months after vaccine and revaccinate if needed MenACWY: 2 dose primary series 8 weeks apart, then 1 dose booster every 5 years HPV: Gardasil-9 at 0, 2, and 6 months for ages 9-26 should be vaccinated.  Ages 46-45 should be offered if appropriate Tdap:  give every 10 years Shingles: give Shingrix 2 dose series at 0 and 2-6 months if >50 years on ART with CD4 cell count >200 Varicella: primary vaccination may be considered in VZV seronegative persons aged >8 years (if CD4 >200)  MMR: vaccine should be given if born in 1957 or after and do not have immunity (if CD4  >200)  Screening DEXA Scan: if age >70 Quantiferon: check at initiation of care Hepatitis C: check at initiation of care.  Screen annually if risk factors HLA [id number]: check at initiation of care G6PD: check if starting therapy with oxidant drugs Lipids: check annually Urinalysis: check annually or every 6 months if on tenofovir Hgb A1c: check annually  ASCVD Risk Score Consider high-intensity statin therapy if 10-year ASCVD risk score >7.5% The ASCVD Risk score (Arnett DK, et al., 2019) failed to calculate for the following reasons:   Cannot find a previous HDL lab   Cannot find a previous total cholesterol lab   Terri Padilla for Infectious Disease [facility] 08/06/2022, 3:26 PM  HIV: Patient here today for 6-week follow up.  She was seen in Dec 2023 and reportedly off ART for 8 months at that time.  Her labs showed a VL of 14,500 copies and Genotyping did not predict any drug resistance but she has known NRTI and NNRTI resistance.  She was started back on ART with Biktarvy and presents today for close follow up.  She is reporting ***.  Will check her labs today and continue with Biktarvy daily.  She has refills on file.  Follow up in 3 months.   Need for PCP PPx: ***  Vaccines: ***  STI/Screening: ***  Encounter for medication monitoring: ***

## 2022-08-30 ENCOUNTER — Emergency Department
Admission: EM | Admit: 2022-08-30 | Discharge: 2022-08-30 | Disposition: A | Discharge status: Home / Self Care | Payer: Self-pay | Attending: Emergency Medicine | Admitting: Emergency Medicine

## 2022-08-30 ENCOUNTER — Other Ambulatory Visit: Payer: Self-pay

## 2022-08-30 DIAGNOSIS — J111 Influenza due to unidentified influenza virus with other respiratory manifestations: Secondary | ICD-10-CM | POA: Insufficient documentation

## 2022-08-30 DIAGNOSIS — Z1152 Encounter for screening for COVID-19: Secondary | ICD-10-CM | POA: Insufficient documentation

## 2022-08-30 LAB — RESP PANEL BY RT-PCR (RSV, FLU A&B, COVID)  RVPGX2
Influenza A by PCR: NEGATIVE
Influenza B by PCR: POSITIVE — AB
Resp Syncytial Virus by PCR: NEGATIVE
SARS Coronavirus 2 by RT PCR: NEGATIVE

## 2022-08-30 LAB — GROUP A STREP BY PCR: Group A Strep by PCR: NOT DETECTED

## 2022-08-30 MED ORDER — MELOXICAM 15 MG PO TABS: 15.0000 mg | ORAL_TABLET | Freq: Every day | ORAL | 0 refills | Status: AC

## 2022-08-30 MED ORDER — CEPACOL EXTRA STRENGTH 15-2.6 MG MT LOZG: 1.0000 | LOZENGE | OROMUCOSAL | 0 refills | Status: AC

## 2022-08-30 NOTE — Discharge Instructions (Addendum)
-  You tested positive for influenza today.  Your body will recover on its own within the next week.  Antibiotics will not help you, however you may take the meloxicam as needed for any body aches/pains.  You may utilize the Cepacol lozenges for your sore throat.  -Follow-up with your primary care provider as needed.  -Return to the emergency department anytime if you begin to experience any new or worsening symptoms.

## 2022-08-30 NOTE — ED Notes (Signed)
EDP at bedside  

## 2022-08-30 NOTE — ED Triage Notes (Signed)
Cough, congestion, sore throat since Monday. No tylenol/motrin since yesterday. No relief with OTC  meds. Pt alert and oriented in triage. Breathing unlabored with symmetric chest rise and fall.

## 2022-08-30 NOTE — ED Provider Notes (Signed)
[facility] Provider Note    Event Date/Time   First MD Initiated Contact with Patient 08/30/22 2031     (approximate)   History   Chief Complaint Cough and Sore Throat   HPI Terri Padilla is a 42 y.o. female, history of HIV (currently on [place] and has been compliant for the past 6 years), presents to the emergency department for evaluation of flulike symptoms.  She reports cough, congestion, sore throat x 6 days.  He has been taking over-the-counter medications, with no relief.  Denies fever, chest pain, shortness of breath, abdominal pain, nausea/vomiting, diarrhea, urinary symptoms, headache, weakness, rashes, or dizziness/lightheadedness.  History Limitations: No limitations.        Physical Exam  Triage Vital Signs: ED Triage Vitals  Enc Vitals Group     BP 08/30/22 2043 113/70     Pulse Rate 08/30/22 2043 (!) 111     Resp 08/30/22 2043 18     Temp 08/30/22 2043 98.2 F (36.8 C)     Temp Source 08/30/22 2043 Oral     SpO2 08/30/22 2043 95 %     Weight 08/30/22 2019 187 lb 6.3 oz (85 kg)     Height 08/30/22 2019 5' 7"$  (1.702 m)     Head Circumference --      Peak Flow --      Pain Score 08/30/22 2019 0     Pain Loc --      Pain Edu? --      Excl. in [place]? --     Most recent vital signs: Vitals:   08/30/22 2043 08/30/22 2156  BP: 113/70   Pulse: (!) 111 95  Resp: 18 18  Temp: 98.2 F (36.8 C)   SpO2: 95%     General: Awake, NAD.  Skin: Warm, dry. No rashes or lesions.  Eyes: PERRL. Conjunctivae normal.  CV: Good peripheral perfusion.  Resp: Normal effort.  Lung sounds are clear bilaterally.  Throat exam unremarkable. Abd: Soft, non-tender. No distention.  Neuro: At baseline. No gross neurological deficits.  Musculoskeletal: Normal ROM of all extremities.  Focused Exam: Audible cough and congestion.  Physical Exam    ED Results / Procedures / Treatments  Labs (all labs ordered are listed, but only abnormal  results are displayed) Labs Reviewed  RESP PANEL BY RT-PCR (RSV, FLU A&B, COVID)  [id number] - Abnormal; Notable for the following components:      Result Value   Influenza B by PCR POSITIVE (*)    All other components within normal limits  GROUP A STREP BY PCR     EKG N/A.    RADIOLOGY  ED Provider Interpretation: N/A.  No results found.  PROCEDURES:  Critical Care performed: N/A.  Procedures    MEDICATIONS ORDERED IN ED: Medications - No data to display   IMPRESSION / MDM / [place] / ED COURSE  I reviewed the triage vital signs and the nursing notes.                              Differential diagnosis includes, but is not limited to, COVID-19, influenza, RSV, pneumonia, viral URI, allergic rhinitis.  Assessment/Plan Presentation consistent with influenza, confirmed by PCR.  Strep test was negative.  No signs of pneumonia at this time, I do not believe chest x-ray is indicated.  Unfortunately, she is outside of the timeframe for Tamiflu.  However, we will provide her  with prescription for Cepacol and meloxicam to help manage her primary symptoms.  Given her HIV status, show advised her to follow-up with her primary care provider if her symptoms fail to improve over the next few days.  She was amenable to this.  Will discharge.  Provided the patient with anticipatory guidance, return precautions, and educational material. Encouraged the patient to return to the emergency department at any time if they begin to experience any new or worsening symptoms. Patient expressed understanding and agreed with the plan.   Patient's presentation is most consistent with acute complicated illness / injury requiring diagnostic workup.       FINAL CLINICAL IMPRESSION(S) / ED DIAGNOSES   Final diagnoses:  Influenza     Rx / DC Orders   ED Discharge Orders          Ordered    meloxicam (MOBIC) 15 MG tablet  Daily        08/30/22 2138    Benzocaine-Menthol (CEPACOL  EXTRA STRENGTH) 15-2.6 MG LOZG  Every 2 hours        08/30/22 2138             Note:  This document was prepared using Dragon voice recognition software and may include unintentional dictation errors.   Teodoro Spray, [place] 08/30/22 2214    Harvest Dark, MD 08/30/22 2246

## 2022-09-23 ENCOUNTER — Ambulatory Visit (INDEPENDENT_AMBULATORY_CARE_PROVIDER_SITE_OTHER): Payer: Self-pay | Admitting: Internal Medicine

## 2022-09-23 ENCOUNTER — Other Ambulatory Visit: Payer: Self-pay

## 2022-09-23 ENCOUNTER — Encounter: Payer: Self-pay | Admitting: Internal Medicine

## 2022-09-23 ENCOUNTER — Other Ambulatory Visit: Payer: Self-pay | Admitting: Pharmacist

## 2022-09-23 VITALS — BP 114/77 | HR 96 | Temp 97.9°F | Wt 189.6 lb

## 2022-09-23 DIAGNOSIS — B20 Human immunodeficiency virus [HIV] disease: Secondary | ICD-10-CM

## 2022-09-23 DIAGNOSIS — R5383 Other fatigue: Secondary | ICD-10-CM | POA: Insufficient documentation

## 2022-09-23 DIAGNOSIS — Z7185 Encounter for immunization safety counseling: Secondary | ICD-10-CM

## 2022-09-23 MED ORDER — BICTEGRAVIR-EMTRICITAB-TENOFOV 50-200-25 MG PO TABS: 1.0000 | ORAL_TABLET | Freq: Every day | ORAL | 0 refills | Status: AC
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
## 2022-09-23 NOTE — Assessment & Plan Note (Signed)
Reports fatigue and puffiness under eyes.  Check thyroid studies.

## 2022-09-23 NOTE — Assessment & Plan Note (Signed)
She is due for second dose of hepatitis B today but declines due to fasting

## 2022-09-23 NOTE — Progress Notes (Signed)
Medication Samples have been provided to the patient.  Drug name: Biktarvy        Strength: 50/200/25 mg       Qty: 14 tablets (2 bottles) LOT: [id number]   Exp.Date: 8/26  Dosing instructions: Take one tablet by mouth once daily  The patient has been instructed regarding the correct time, dose, and frequency of taking this medication, including desired effects and most common side effects.   Alfonse Spruce, PharmD, CPP, BCIDP, [place] Clinical Pharmacist Practitioner [facility] [place] [facility]
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
## 2022-09-26 LAB — THYROID PANEL WITH TSH
Free Thyroxine Index: 1.9 (ref 1.4–3.8)
T3 Uptake: 26 % (ref 22–35)
T4, Total: 7.4 ug/dL (ref 5.1–11.9)
TSH: 2.49 mIU/L

## 2022-09-26 LAB — COMPLETE METABOLIC PANEL WITH GFR
AG Ratio: 0.7 (calc) — ABNORMAL LOW (ref 1.0–2.5)
ALT: 10 U/L (ref 6–29)
AST: 13 U/L (ref 10–30)
Albumin: 3.5 g/dL — ABNORMAL LOW (ref 3.6–5.1)
Alkaline phosphatase (APISO): 30 U/L — ABNORMAL LOW (ref 31–125)
BUN: 12 mg/dL (ref 7–25)
CO2: 26 mmol/L (ref 20–32)
Calcium: 9.1 mg/dL (ref 8.6–10.2)
Chloride: 105 mmol/L (ref 98–110)
Creat: 0.58 mg/dL (ref 0.50–0.99)
Globulin: 5.1 g/dL (calc) — ABNORMAL HIGH (ref 1.9–3.7)
Glucose, Bld: 95 mg/dL (ref 65–99)
Potassium: 4 mmol/L (ref 3.5–5.3)
Sodium: 138 mmol/L (ref 135–146)
Total Bilirubin: 0.3 mg/dL (ref 0.2–1.2)
Total Protein: 8.6 g/dL — ABNORMAL HIGH (ref 6.1–8.1)
eGFR: 116 mL/min/{1.73_m2} (ref >=60)

## 2022-09-26 LAB — HIV-1 RNA QUANT-NO REFLEX-BLD
HIV 1 RNA Quant: 7610 Copies/mL — ABNORMAL HIGH
HIV-1 RNA Quant, Log: 3.88 Log cps/mL — ABNORMAL HIGH

## 2022-09-29 ENCOUNTER — Telehealth: Payer: Self-pay

## 2022-09-29 NOTE — Telephone Encounter (Signed)
-----   Message from Mignon Pine, DO sent at 09/28/2022 10:35 AM EDT ----- Labs (as expected) with viral load of 7610.  Can we get her scheduled for 6 week follow up and ask if she was able to get in contact with her pharmacy to refill her [place]?  Thanks, Mitzi Hansen

## 2022-09-29 NOTE — Telephone Encounter (Signed)
Left voicemail asking patient to return my call.   Graison Leinberger P Virlan Kempker, CMA  

## 2022-10-01 ENCOUNTER — Ambulatory Visit: Payer: Self-pay | Admitting: Internal Medicine

## 2022-12-05 ENCOUNTER — Telehealth: Payer: Self-pay

## 2022-12-05 NOTE — Telephone Encounter (Addendum)
Received call from [facility] requesting authorization to refill Biktarvy. Patient has not filled since 06/2022. Routing to provider for approval.   [facility] [phone]  Sandie Ano, RN

## 2022-12-09 NOTE — Telephone Encounter (Signed)
Spoke with [employer] and authorized a refill of Biktarvy.   Sandie Ano, RN

## 2022-12-18 ENCOUNTER — Ambulatory Visit: Payer: Self-pay | Admitting: Internal Medicine

## 2022-12-18 NOTE — Progress Notes (Signed)
Patient walked in, she will be going to [place] for two months and needs enough Biktarvy to last her through that time. She is meeting with Deanna to see if we can do a UMAP override.   Sandie Ano, RN

## 2022-12-24 ENCOUNTER — Telehealth: Payer: Self-pay

## 2022-12-24 NOTE — Telephone Encounter (Signed)
Detectable Viral Load Intervention   Most recent VL:  HIV 1 RNA Quant  Date Value Ref Range Status  09/23/2022 7,610 (H) Copies/mL Final  06/19/2022 14,500 (H) Copies/mL Final  11/14/2020 32 (H) Copies/mL Final    Current ART regimen: Biktarvy  Appointment status: patient has future appointment scheduled  Called patient to discuss medication adherence and possible barriers to care.   Medication last dispensed (per chart review):   Dispensed Days Supply Quantity Provider Pharmacy  BIKTARVY 50/200/25MG  TABLETS 12/18/2022 60 60 each Kathlynn Grate, DO [facility] [phone] [place]...  BIKTARVY 50/200/25MG  TABLETS 12/09/2022 30 30 each Kathlynn Grate, DO [facility] [phone] [place]...  BIKTARVY 50/200/25MG  TABLETS 06/24/2022 30 30 each Kathlynn Grate, DO [facility] [phone] [place]...    Medication Adherence   Unable to assess.   Barriers to Care   Unable to assess.    Interventions: Called Lynnea on both numbers listed in chart, no answer and voicemail is full on both numbers. She was scheduled to be seen in June, but missed that appointment. If possible, it would be better for her to come in sooner than September to monitor her adherence.   Sandie Ano, RN

## 2022-12-25 NOTE — Progress Notes (Signed)
The ASCVD Risk score (Arnett DK, et al., 2019) failed to calculate for the following reasons:   Cannot find a previous HDL lab   Cannot find a previous total cholesterol lab  Sandie Ano, RN

## 2022-12-30 ENCOUNTER — Telehealth: Payer: Self-pay

## 2022-12-30 NOTE — Telephone Encounter (Signed)
Voice mail is full, unable to reach patient to schedule missed appointment with Dr. Thedore Mins before her September appointment, ok to schedule with Arvilla Meres or Pharmacy.

## 2023-03-18 ENCOUNTER — Ambulatory Visit: Payer: Self-pay

## 2023-03-18 ENCOUNTER — Ambulatory Visit: Payer: Self-pay | Admitting: Internal Medicine

## 2023-03-18 NOTE — Progress Notes (Deleted)
[facility]     HPI: Terri Padilla is a 42 y.o. female presents for HIV follow-up.  She was last seen by Dr. Earlene Plater on 09/23/2022.  Noted to be previously in December 2023 at which time she has been off ART x 8 months.  Viral load at that time was 14K.  Genotype did not predict any resistance.  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  Prior regimen included Genvoya and Prezista.  Initiated [place] in December 2023.  She was advised to follow-up 4 to 6 weeks at that point but did not.  In March 2024 she had been off of Biktarvy for about 2 weeks due to issues with her pharmacy.  Viral load in December 2024 was 7K. 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  Anal sex receptive***, insertive***. Contraception**** Oral sex, contraception*** Vaginal penile sex, contraception***  Social: Occupation: Housing: Support: Understanding of HIV: Etoh/drug/tobacco use:  Past Medical History:  Diagnosis Date   HIV (human immunodeficiency virus infection) (H[facility])     Past Surgical History:  Procedure Laterality Date   CESAREAN SECTION     C/S x 4   CESAREAN SECTION N/A 06/22/2016   Procedure: CESAREAN SECTION;  Surgeon: Lazaro Arms, MD;  Location: [facility];  Service: Obstetrics;  Laterality: N/A;   CESAREAN SECTION N/A 03/25/2019   Procedure: CESAREAN SECTION AND IUD INSERTION;  Surgeon: Friendship Bing, MD;  Location: [facility];  Service: Obstetrics;  Laterality: N/A;  spinal and epidural   CIRCUMCISION     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    Family History  Problem Relation Age of Onset   Arthritis Mother    Down syndrome Daughter    Asthma Neg Hx    Alcohol abuse Neg Hx    Birth defects Neg Hx    Cancer Neg Hx    COPD Neg Hx    Depression Neg Hx    Diabetes Neg Hx    Drug abuse Neg Hx    Early death  Neg Hx    Hearing loss Neg Hx    Heart disease Neg Hx    Hyperlipidemia Neg Hx    Hypertension Neg Hx    Kidney disease Neg Hx    Learning disabilities Neg Hx    Mental illness Neg Hx    Mental retardation Neg Hx    Miscarriages / Stillbirths Neg Hx    Stroke Neg Hx    Vision loss Neg Hx    Varicose Veins Neg Hx    Current Outpatient Medications on File Prior to Visit  Medication Sig Dispense Refill   bictegravir-emtricitabine-tenofovir AF (BIKTARVY) 50-200-25 MG TABS tablet Take 1 tablet by mouth daily. 30 tablet 11   norethindrone-ethinyl estradiol-FE (JUNEL FE 1/20) 1-20 MG-MCG tablet Take 1 tablet by mouth daily. (Patient not taking: Reported on 09/23/2022) 84 tablet 3   No current facility-administered medications on file prior to visit.    Allergies  Allergen Reactions   Chloroquine Itching    Per Interpreter: Nivaquine (Chloroquine)      Lab Results HIV 1 RNA Quant (Copies/mL)  Date Value  09/23/2022 7,610 (H)  06/19/2022 14,500 (H)  11/14/2020 32 (H)   CD4 T Cell Abs (/uL)  Date Value  06/19/2022 712  11/14/2020 1,322  12/27/2018 1,525   Lab Results  Component Value Date   HIV1GENOSEQ REPORT 05/22/2016   Lab Results  Component Value Date   WBC 4.7 06/19/2022   HGB 11.8 06/19/2022   HCT 36.3 06/19/2022   MCV 81.4 06/19/2022   PLT 307 06/19/2022    Lab Results  Component Value Date   CREATININE 0.58 09/23/2022   BUN 12 09/23/2022   NA 138 09/23/2022   K 4.0 09/23/2022   CL 105 09/23/2022   CO2 26 09/23/2022   Lab Results  Component Value Date   ALT 10 09/23/2022   AST 13 09/23/2022   ALKPHOS 38 11/10/2021   BILITOT 0.3 09/23/2022    Lab Results  Component Value Date   CHOL 183 12/08/2017   TRIG 63 12/08/2017   HDL 47 (L) 12/08/2017   LDLCALC 121 (H) 12/08/2017   Lab Results  Component Value Date   HAV REACTIVE (A) 03/04/2016   Lab Results  Component Value Date   HEPBSAG Negative 11/10/2018   HEPBSAB NEG 03/04/2016   Lab  Results  Component Value Date   HCVAB NEGATIVE 03/04/2016   Lab Results  Component Value Date   CHLAMYDIAWP Negative 06/19/2022   N Negative 06/19/2022   No results found for: "GCPROBEAPT" Lab Results  Component Value Date   QUANTGOLD NEGATIVE 03/04/2016    Assessment/Plan #HIV -[id number], [id number], on 09/23/22  Plan -Follow up in   #Vaccination COVID Flu Monkeypox PCV 23 07/2018 Meningitis HepA immun e2017 HEpB 06/19/22, 2nd dose Tdap6/2020 Shingles  #Health maintenance -Quantiferon today lady -RPR NR 06/19/22 -HCV 06/19/22 -GC today left9/4/24 1 -Lipid today  03/18/23  -Dysplasia screen F/M -Mammogram  -Colonoscopy    Danelle Earthly, MD [facility]

## 2023-03-27 ENCOUNTER — Telehealth: Payer: Self-pay

## 2023-03-27 NOTE — Telephone Encounter (Signed)
Detectable Viral Load Intervention (DVL)  Most recent VL:  HIV 1 RNA Quant  Date Value Ref Range Status  09/23/2022 7,610 (H) Copies/mL Final  06/19/2022 14,500 (H) Copies/mL Final  11/14/2020 32 (H) Copies/mL Final    Last Clinic Visit: 09/23/22  Current ART regimen: Biktarvy  Appointment status: patient does not have future appointment scheduled   Medication last dispensed (per chart review):    Dispensed Days Supply Quantity Provider Pharmacy  BIKTARVY 50/200/25MG  TABLETS 12/18/2022 60 60 each Kathlynn Grate, DO [facility]  BIKTARVY 50/200/25MG  TABLETS 12/09/2022 30 30 each Kathlynn Grate, DO [facility]  BIKTARVY 50/200/25MG  TABLETS 06/24/2022 30 30 each Kathlynn Grate, DO [facility]    Medication Adherence   Unable to assess  Barriers to Care   Unable to assess    Interventions   Called Jemiiliah on both numbers listed in chart. Not able to reach her at this time. Mailbox is full. Scheduled for appointment on 9/4, but no showed. Does not have future appointment's scheduled.   Juanita Laster, RMA

## 2023-04-16 ENCOUNTER — Encounter: Payer: Self-pay | Admitting: Infectious Diseases

## 2023-04-16 ENCOUNTER — Ambulatory Visit (INDEPENDENT_AMBULATORY_CARE_PROVIDER_SITE_OTHER): Payer: Self-pay | Admitting: Infectious Diseases

## 2023-04-16 ENCOUNTER — Other Ambulatory Visit: Payer: Self-pay

## 2023-04-16 VITALS — BP 112/80 | HR 98 | Temp 97.9°F | Wt 198.0 lb

## 2023-04-16 DIAGNOSIS — Z5181 Encounter for therapeutic drug level monitoring: Secondary | ICD-10-CM | POA: Insufficient documentation

## 2023-04-16 DIAGNOSIS — Z7185 Encounter for immunization safety counseling: Secondary | ICD-10-CM | POA: Insufficient documentation

## 2023-04-16 DIAGNOSIS — T8332XA Displacement of intrauterine contraceptive device, initial encounter: Secondary | ICD-10-CM

## 2023-04-16 DIAGNOSIS — Z23 Encounter for immunization: Secondary | ICD-10-CM

## 2023-04-16 DIAGNOSIS — Z3009 Encounter for other general counseling and advice on contraception: Secondary | ICD-10-CM | POA: Insufficient documentation

## 2023-04-16 DIAGNOSIS — B20 Human immunodeficiency virus [HIV] disease: Secondary | ICD-10-CM

## 2023-04-16 DIAGNOSIS — Z113 Encounter for screening for infections with a predominantly sexual mode of transmission: Secondary | ICD-10-CM

## 2023-04-16 DIAGNOSIS — Z0000 Encounter for general adult medical examination without abnormal findings: Secondary | ICD-10-CM | POA: Insufficient documentation

## 2023-04-16 MED ORDER — BICTEGRAVIR-EMTRICITAB-TENOFOV 50-200-25 MG PO TABS: 1.0000 | ORAL_TABLET | Freq: Every day | ORAL | 5 refills | Status: DC

## 2023-04-16 MED ORDER — NORETHIN ACE-ETH ESTRAD-FE 1-20 MG-MCG PO TABS: 1.0000 | ORAL_TABLET | Freq: Every day | ORAL | 0 refills | Status: DC

## 2023-04-16 NOTE — Progress Notes (Signed)
[address], [place], [place], [address]                                                                  Phn. [phone]; Fax: [phone]                                                                             Date: 04/16/2023  Reason for Visit: Routine HIV care.   HPI: Terri Padilla is a 42 y.o.old female with a history of HIV.  Last seen on 09/2022 by Dr Earlene Plater.  Lab Results  Component Value Date   HIV1RNAQUANT 7,610 (H) 09/23/2022   Lab Results  Component Value Date   CD4TABS 712 06/19/2022   CD4TABS 1,322 11/14/2020   CD4TABS 1,525 12/27/2018    Interval hx/current visit: Patient was off ART for 2 weeks when seen in March 2024 by Dr. Earlene Plater.  Her viral load from that visit was elevated to 7610.  She was re-started on Biktarvy. Report she has been taking Bijtarvy consistently since last visit without missing any doses or concerns.  Reports her IUD was removed Jan 2024 and she was given 3 months of OCPs.  She has run out of OCPs for last 1 month and asking for contact address of her gynecologist for more refills.  She lives with her husband and 5 children.  She is not working and going to school and plans to become a Engineer, civil (consulting).  She is willing to get a flu vaccine and also see a dentist. She has no other complaints today  As stated in above HPI; all other systems were reviewed and are otherwise negative unless noted below  No reported fever / chills, night sweats, unintentional weight loss, acute visual change, odynophagia, chest pain/pressure, new or worsened SOB or WOB, nausea, vomiting, diarrhea, dysuria, GU discharge, syncope, seizures, red/hot swollen joints, hallucinations / delusions, rashes, new allergies, unusual / excessive bleeding, swollen lymph nodes, or new  hospitalizations/ED visits/Urgent Care visits since the pt was last seen.  PMH/ PSH/ FamHx / Social Hx , medications and allergies reviewed and updated as appropriate; please see corresponding tab in EHR / prior notes                                         Outpatient Medications Prior to Visit  Medication Sig Dispense Refill   bictegravir-emtricitabine-tenofovir AF (BIKTARVY) 50-200-25 MG TABS tablet Take 1 tablet by mouth daily. 30 tablet 11   norethindrone-ethinyl estradiol-FE (JUNEL FE 1/20) 1-20 MG-MCG tablet Take 1 tablet  by mouth daily. (Patient not taking: Reported on 09/23/2022) 84 tablet 3   No facility-administered medications prior to visit.    Allergies  Allergen Reactions   Chloroquine Itching    Per Interpreter: Nivaquine (Chloroquine)    Past Medical History:  Diagnosis Date   HIV (human immunodeficiency virus infection) (HCC)    Past Surgical History:  Procedure Laterality Date   CESAREAN SECTION     C/S x 4   CESAREAN SECTION N/A 06/22/2016   Procedure: CESAREAN SECTION;  Surgeon: Lazaro Arms, MD;  Location: [facility] SU[facility];  Service: Obstetrics;  Laterality: N/A;   CESAREAN SECTION N/A 03/25/2019   Procedure: CESAREAN SECTION AND IUD INSERTION;  Surgeon: Parc Bing, MD;  Location: [facility];  Service: Obstetrics;  Laterality: N/A;  spinal and epidural   CIRCUMCISION     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   Social History   Socioeconomic History   Marital status: Married    Spouse name: Not on file   Number of children: Not on file   Years of education: Not on file   Highest education level: Not on file  Occupational History   Not on file  Tobacco Use   Smoking status: Never   Smokeless tobacco: Never  Vaping Use   Vaping status: Never Used  Substance and Sexual Activity   Alcohol use: No   Drug use: No   Sexual activity: Yes  Other Topics Concern   Not on file  Social History Narrative   Not on file   Social Determinants of  Health   Financial Resource Strain: Not on file  Food Insecurity: Not on file  Transportation Needs: Not on file  Physical Activity: Not on file  Stress: Not on file  Social Connections: Not on file  Intimate Partner Violence: Not on file   Family History  Problem Relation Age of Onset   Arthritis Mother    Down syndrome Daughter    Asthma Neg Hx    Alcohol abuse Neg Hx    Birth defects Neg Hx    Cancer Neg Hx    COPD Neg Hx    Depression Neg Hx    Diabetes Neg Hx    Drug abuse Neg Hx    Early death Neg Hx    Hearing loss Neg Hx    Heart disease Neg Hx    Hyperlipidemia Neg Hx    Hypertension Neg Hx    Kidney disease Neg Hx    Learning disabilities Neg Hx    Mental illness Neg Hx    Mental retardation Neg Hx    Miscarriages / Stillbirths Neg Hx    Stroke Neg Hx    Vision loss Neg Hx    Varicose Veins Neg Hx     Vitals  BP 112/80   Pulse 98   Temp 97.9 F (36.6 C) (Temporal)   Wt 198 lb (89.8 kg)   SpO2 98%   BMI 31.01 kg/m    Examination  Gen: no acute distress HEENT: [place]/AT, no scleral icterus, no pale conjunctivae, hearing normal, oral mucosa moist Neck: Supple Cardio: Regular rate and rhythm Resp: Pulmonary effort normal in room air GI: nondistended GU: Musc: Extremities: No pedal edema Skin: No rashes Neuro: grossly non focal , awake, alert and oriented * 3  Psych: Calm, cooperative    Lab Results HIV 1 RNA Quant (Copies/mL)  Date Value  09/23/2022 7,610 (H)  06/19/2022 14,500 (H)  11/14/2020 32 (H)  CD4 T Cell Abs (/uL)  Date Value  06/19/2022 712  11/14/2020 1,322  12/27/2018 1,525   Lab Results  Component Value Date   HIV1GENOSEQ REPORT 05/22/2016   Lab Results  Component Value Date   WBC 4.7 06/19/2022   HGB 11.8 06/19/2022   HCT 36.3 06/19/2022   MCV 81.4 06/19/2022   PLT 307 06/19/2022    Lab Results  Component Value Date   CREATININE 0.58 09/23/2022   BUN 12 09/23/2022   NA 138 09/23/2022   K 4.0 09/23/2022   CL  105 09/23/2022   CO2 26 09/23/2022   Lab Results  Component Value Date   ALT 10 09/23/2022   AST 13 09/23/2022   ALKPHOS 38 11/10/2021   BILITOT 0.3 09/23/2022    Lab Results  Component Value Date   CHOL 183 12/08/2017   TRIG 63 12/08/2017   HDL 47 (L) 12/08/2017   LDLCALC 121 (H) 12/08/2017   Lab Results  Component Value Date   HAV REACTIVE (A) 03/04/2016   Lab Results  Component Value Date   HEPBSAG Negative 11/10/2018   HEPBSAB NEG 03/04/2016   Lab Results  Component Value Date   HCVAB NEGATIVE 03/04/2016   Lab Results  Component Value Date   CHLAMYDIAWP Negative 06/19/2022   N Negative 06/19/2022   No results found for: "GCPROBEAPT" Lab Results  Component Value Date   QUANTGOLD NEGATIVE 03/04/2016      Health Maintenance: Immunization History  Administered Date(s) Administered   Hepb-cpg 06/19/2022   Influenza,inj,Quad PF,6+ Mos 05/28/2016, 04/21/2018, 03/17/2019   Pneumococcal Conjugate-13 04/21/2018   Pneumococcal Polysaccharide-23 07/26/2018   Tdap 01/07/2019   Assessment/Plan: # HIV H/o M184V, Y181 C, V90I Continue Biktarvy, reports compliance, meds refilled  Labs today  Fu in 6 weeks with Dr Drue Second   # Contraception One month refill of OCPs sent and contact information of Gnyecology Dr Para March provided for further refills vs another methods of contraception  # Puffiness  beneath the eyes TSH in march 2024 wnl  Discussed to establish care with PCP for further evaluation   # STD Screening  No acute concerns  Urine GC and RPR  #Immunization  Flu vaccine today  #Health maintenance Dental referral placed  Patient's labs were reviewed as well as his previous records. Patients questions were addressed and answered. Safe sex counseling done.  I have personally spent 47  minutes involved in face-to-face and non-face-to-face activities for this patient on the day of the visit. Professional time spent includes the following activities:  Preparing to see the patient (review of tests), Obtaining and/or reviewing separately obtained history (admission/discharge record), Performing a medically appropriate examination and/or evaluation , Ordering medications/tests/procedures, referring and communicating with other health care professionals, Documenting clinical information in the EMR, Independently interpreting results (not separately reported), Communicating results to the patient/family/caregiver, Counseling and educating the patient/family/caregiver and Care coordination (not separately reported).    Electronically signed by:  Odette Fraction, MD Infectious Disease Physician [facility] [address] [place], [place] [address] Phone: [phone]  Fax: [phone]

## 2023-04-17 LAB — T-HELPER CELLS (CD4) COUNT (NOT AT ARMC)
CD4 % Helper T Cell: 33 % (ref 33–65)
CD4 T Cell Abs: 801 /uL (ref 400–1790)

## 2023-04-20 ENCOUNTER — Telehealth: Payer: Self-pay

## 2023-04-20 LAB — CBC
HCT: 36.3 % (ref 35.0–45.0)
Hemoglobin: 11.9 g/dL (ref 11.7–15.5)
MCH: 27.5 pg (ref 27.0–33.0)
MCHC: 32.8 g/dL (ref 32.0–36.0)
MCV: 83.8 fL (ref 80.0–100.0)
MPV: 10.9 fL (ref 7.5–12.5)
Platelets: 341 10*3/uL (ref 140–400)
RBC: 4.33 10*6/uL (ref 3.80–5.10)
RDW: 13.6 % (ref 11.0–15.0)
WBC: 5.8 10*3/uL (ref 3.8–10.8)

## 2023-04-20 LAB — COMPREHENSIVE METABOLIC PANEL
AG Ratio: 0.7 (calc) — ABNORMAL LOW (ref 1.0–2.5)
ALT: 17 U/L (ref 6–29)
AST: 16 U/L (ref 10–30)
Albumin: 3.4 g/dL — ABNORMAL LOW (ref 3.6–5.1)
Alkaline phosphatase (APISO): 47 U/L (ref 31–125)
BUN/Creatinine Ratio: 11 (calc) (ref 6–22)
BUN: 6 mg/dL — ABNORMAL LOW (ref 7–25)
CO2: 26 mmol/L (ref 20–32)
Calcium: 8.7 mg/dL (ref 8.6–10.2)
Chloride: 104 mmol/L (ref 98–110)
Creat: 0.56 mg/dL (ref 0.50–0.99)
Globulin: 4.9 g/dL — ABNORMAL HIGH (ref 1.9–3.7)
Glucose, Bld: 91 mg/dL (ref 65–99)
Potassium: 4.3 mmol/L (ref 3.5–5.3)
Sodium: 136 mmol/L (ref 135–146)
Total Bilirubin: 0.2 mg/dL (ref 0.2–1.2)
Total Protein: 8.3 g/dL — ABNORMAL HIGH (ref 6.1–8.1)

## 2023-04-20 LAB — LIPID PANEL
Cholesterol: 140 mg/dL (ref <200)
HDL: 42 mg/dL — ABNORMAL LOW (ref >=50)
LDL Cholesterol (Calc): 80 mg/dL
Non-HDL Cholesterol (Calc): 98 mg/dL (ref <130)
Total CHOL/HDL Ratio: 3.3 (calc) (ref <5.0)
Triglycerides: 94 mg/dL (ref <150)

## 2023-04-20 LAB — HIV RNA, RTPCR W/R GT (RTI, PI,INT)
HIV 1 RNA Quant: 39 {copies}/mL — ABNORMAL HIGH
HIV-1 RNA Quant, Log: 1.59 {Log} — ABNORMAL HIGH

## 2023-04-20 LAB — URINE CYTOLOGY ANCILLARY ONLY
Chlamydia: NEGATIVE
Comment: NEGATIVE
Comment: NORMAL
Neisseria Gonorrhea: NEGATIVE

## 2023-04-20 LAB — RPR: RPR Ser Ql: NONREACTIVE

## 2023-04-20 NOTE — Telephone Encounter (Signed)
-----   Message from Victoriano Lain sent at 04/20/2023  7:17 AM EDT ----- Please let patient know Viral load has gone down from 7,610 to 39 which is excellent and she should continue taking Bijtarvy daily without missing doses.

## 2023-04-20 NOTE — Telephone Encounter (Signed)
Called patient regarding results. Congratulated her on most recent VL. Encouraged patient to continue to take medication as directed. No questions at this time.  Juanita Laster, RMA

## 2023-05-27 ENCOUNTER — Other Ambulatory Visit: Payer: Self-pay

## 2023-05-27 ENCOUNTER — Ambulatory Visit (INDEPENDENT_AMBULATORY_CARE_PROVIDER_SITE_OTHER): Payer: Self-pay | Admitting: Internal Medicine

## 2023-05-27 ENCOUNTER — Encounter: Payer: Self-pay | Admitting: Internal Medicine

## 2023-05-27 ENCOUNTER — Other Ambulatory Visit: Payer: Self-pay | Admitting: Pharmacist

## 2023-05-27 VITALS — BP 119/80 | HR 82 | Resp 16 | Ht 67.0 in | Wt 198.0 lb

## 2023-05-27 DIAGNOSIS — G4700 Insomnia, unspecified: Secondary | ICD-10-CM

## 2023-05-27 DIAGNOSIS — B20 Human immunodeficiency virus [HIV] disease: Secondary | ICD-10-CM

## 2023-05-27 DIAGNOSIS — Z79899 Other long term (current) drug therapy: Secondary | ICD-10-CM

## 2023-05-27 DIAGNOSIS — T8332XA Displacement of intrauterine contraceptive device, initial encounter: Secondary | ICD-10-CM

## 2023-05-27 MED ORDER — NORETHIN ACE-ETH ESTRAD-FE 1-20 MG-MCG PO TABS
1.0000 | ORAL_TABLET | Freq: Every day | ORAL | 11 refills | Status: DC
Start: 2023-05-27 — End: 2023-12-10

## 2023-05-27 MED ORDER — BICTEGRAVIR-EMTRICITAB-TENOFOV 50-200-25 MG PO TABS: 1.0000 | ORAL_TABLET | Freq: Every day | ORAL | 11 refills | Status: DC

## 2023-05-27 MED ORDER — BIKTARVY 50-200-25 MG PO TABS: 1.0000 | ORAL_TABLET | Freq: Every day | ORAL | Status: AC

## 2023-05-27 NOTE — Progress Notes (Signed)
 Medication Samples have been provided to the patient.  Drug name: Biktarvy        Strength: 50/200/25 mg       Qty: 14 tablets (2 bottles)  LOT: [id number]   Exp.Date: 102026  Dosing instructions: Take one tablet by mouth once daily  The patient has been instructed regarding the correct time, dose, and frequency of taking this medication, including desired effects and most common side effects.   Gissel Keilman L. Jannette Fogo, PharmD, BCIDP, AAHIVP, CPP Clinical Pharmacist Practitioner Infectious Diseases Clinical Pharmacist [facility] 06/25/2020, 10:07 AM

## 2023-05-27 NOTE — Progress Notes (Unsigned)
Patient ID: Terri Padilla, female   DOB: [birth year], 42 y.o.   MRN: [id number]  HPI Terri Padilla is a 42yo F with well controlled hiv disease. Continues to take biktarvy daily. She mentioned she had large out of pocket cost yesterday. She brings her 87 yo daughter with her. Both in good health. She was in [place] -3 months ago. No illnesses. Of late she reports  Poor sleep in ros  Outpatient Encounter Medications as of 05/27/2023  Medication Sig   bictegravir-emtricitabine-tenofovir AF (BIKTARVY) 50-200-25 MG TABS tablet Take 1 tablet by mouth daily.   norethindrone-ethinyl estradiol-FE (JUNEL FE 1/20) 1-20 MG-MCG tablet Take 1 tablet by mouth daily.   No facility-administered encounter medications on file as of 05/27/2023.     Patient Active Problem List   Diagnosis Date Noted   Immunization counseling 04/16/2023   Health care maintenance 04/16/2023   Encounter for counseling regarding contraception 04/16/2023   Medication monitoring encounter 04/16/2023   Other fatigue 09/23/2022   HIV disease (HCC) 06/19/2022   Vaccine counseling 06/19/2022   Routine screening for STI (sexually transmitted infection) 06/19/2022   Malpositioned IUD 06/19/2022   History of cesarean delivery 03/25/2019   IUD (intrauterine device) in place 03/25/2019   Obesity in pregnancy 12/15/2018   UTI in pregnancy 11/15/2018   Language barrier 11/10/2018   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 appears well-developed and well-nourished. No distress.  HENT: [place]/AT, PERRLA, no scleral icterus Mouth/Throat: Oropharynx is clear and moist. No oropharyngeal exudate.  Cardiovascular: Normal rate, regular rhythm and normal heart sounds. Exam reveals no gallop and no friction rub.  No murmur heard.  Pulmonary/Chest: Effort normal and breath sounds normal. No respiratory distress.  has no wheezes.  Neck = supple, no nuchal rigidity Abdominal: Soft. Bowel sounds are normal.  exhibits no distension. There is no tenderness.  Lymphadenopathy: no cervical adenopathy. No axillary adenopathy Neurological: alert and oriented to person, place, and time.  Skin: Skin is warm and dry. No rash noted. No erythema.  Psychiatric: a normal mood and affect.  behavior is normal.   Lab Results  Component Value Date   CD4TCELL 33 04/16/2023   Lab Results  Component Value Date   CD4TABS 801 04/16/2023   CD4TABS 712 06/19/2022   CD4TABS 1,322 11/14/2020   Lab Results  Component Value Date   HIV1RNAQUANT 39 (H) 04/16/2023   Lab Results  Component Value Date   HEPBSAB NEG 03/04/2016   Lab Results  Component Value Date   LABRPR NON-REACTIVE 04/16/2023    CBC Lab Results  Component Value Date   WBC 5.8 04/16/2023   RBC 4.33 04/16/2023   HGB 11.9 04/16/2023   HCT 36.3 04/16/2023   PLT 341 04/16/2023   MCV 83.8 04/16/2023   MCH 27.5 04/16/2023   MCHC 32.8 04/16/2023   RDW 13.6 04/16/2023   LYMPHSABS 2.8 11/10/2021  MONOABS 0.5 11/10/2021   EOSABS 0.1 11/10/2021    BMET Lab Results  Component Value Date   NA 136 04/16/2023   K 4.3 04/16/2023   CL 104 04/16/2023   CO2 26 04/16/2023   GLUCOSE 91 04/16/2023   BUN 6 (L) 04/16/2023   CREATININE 0.56 04/16/2023   CALCIUM 8.7 04/16/2023   GFRNONAA >60 11/10/2021   GFRAA 127 11/14/2020      Assessment and Plan  HIV disease = continue with biktarvy, to take daily. Will give refills. VL is undetectable- well  controlled  Long term medication management = cr is stable  Insomnia = try to switch back to day awake and night sleep schedule. Use melatonin at night, avoid naps in day time.  Health maintenance = Dental referral  Women's health = Ocp refills

## 2023-11-24 ENCOUNTER — Emergency Department: Payer: Self-pay

## 2023-11-24 ENCOUNTER — Emergency Department
Admission: EM | Admit: 2023-11-24 | Discharge: 2023-11-24 | Disposition: A | Discharge status: Home / Self Care | Payer: Self-pay | Attending: Emergency Medicine | Admitting: Emergency Medicine

## 2023-11-24 ENCOUNTER — Other Ambulatory Visit: Payer: Self-pay

## 2023-11-24 DIAGNOSIS — B20 Human immunodeficiency virus [HIV] disease: Secondary | ICD-10-CM | POA: Insufficient documentation

## 2023-11-24 DIAGNOSIS — J189 Pneumonia, unspecified organism: Secondary | ICD-10-CM | POA: Insufficient documentation

## 2023-11-24 LAB — SARS CORONAVIRUS 2 BY RT PCR: SARS Coronavirus 2 by RT PCR: NEGATIVE

## 2023-11-24 LAB — RESP PANEL BY RT-PCR (RSV, FLU A&B, COVID)  RVPGX2
Influenza A by PCR: NEGATIVE
Influenza B by PCR: NEGATIVE
Resp Syncytial Virus by PCR: NEGATIVE
SARS Coronavirus 2 by RT PCR: NEGATIVE

## 2023-11-24 MED ORDER — DOXYCYCLINE HYCLATE 100 MG PO TABS: 100.0000 mg | ORAL_TABLET | Freq: Two times a day (BID) | ORAL | 0 refills | Status: AC

## 2023-11-24 MED ORDER — AMOXICILLIN-POT CLAVULANATE 875-125 MG PO TABS: 1.0000 | ORAL_TABLET | Freq: Two times a day (BID) | ORAL | 0 refills | Status: AC

## 2023-11-24 NOTE — ED Provider Notes (Signed)
 [facility] Provider Note    Event Date/Time   First MD Initiated Contact with Patient 11/24/23 1802     (approximate)   History   Cough   HPI Terri Padilla is a 43 y.o. female presenting to the emergency department for dry cough, rhinorrhea, body aches, difficulty breathing and fever since Sunday. Denies sore throat, chest pain, nausea, vomiting, diarrhea.  Patient does have HIV.  Denies recent travel, sick contacts.     Physical Exam   Triage Vital Signs: ED Triage Vitals  Encounter Vitals Group     BP 11/24/23 1535 (!) 159/88     Systolic BP Percentile --      Diastolic BP Percentile --      Pulse Rate 11/24/23 1535 (!) 118     Resp 11/24/23 1535 20     Temp 11/24/23 1535 99.2 F (37.3 C)     Temp Source 11/24/23 1535 Oral     SpO2 11/24/23 1535 95 %     Weight 11/24/23 1536 191 lb 12.8 oz (87 kg)     Height 11/24/23 1536 5\' 4"  (1.626 m)     Head Circumference --      Peak Flow --      Pain Score 11/24/23 1532 6     Pain Loc --      Pain Education --      Exclude from Growth Chart --     Most recent vital signs: Vitals:   11/24/23 1535  BP: (!) 159/88  Pulse: (!) 118  Resp: 20  Temp: 99.2 F (37.3 C)  SpO2: 95%   General: Coughing in room, temp 99.2 degrees Fahrenheit.  Head: Normocephalic, atraumatic. Ears/Nose/Throat: TMs intact b/l. Nares patent, no nasal discharge. Oropharynx moist, no erythema or exudate. Dentition intact. CV: Tachycardic at 118. No murmurs, rubs, or gallops. Peripheral pulses 2+ and symmetric. No edema. Respiratory: Rales/crackles heard in right lower lobe region.  GI: Soft, non-distended, non-tender.  Skin: Warm, dry, intact. No rashes, lesions, or ecchymosis. No cyanosis or pallor. Neurological: A&Ox4 to person, place, time, and situation. Psychiatric: Mood and affect appropriate. Thought processes coherent.    ED Results / Procedures / Treatments   Labs (all labs ordered are listed, but  only abnormal results are displayed) Labs Reviewed  SARS CORONAVIRUS 2 BY RT PCR  RESP PANEL BY RT-PCR (RSV, FLU A&B, COVID)  [id number]   Negative for COVID-19, influenza A, influenza B, RSV.  EKG  Not ordered   RADIOLOGY Chest x-ray ordered.  FINDINGS: There are heterogeneous opacities in the right lung lower lobe without volume loss, compatible with pneumonia. Follow-up to clearing is recommended. Bilateral lung fields are otherwise clear. Bilateral costophrenic angles are clear.   Normal cardio-mediastinal silhouette.   No acute osseous abnormalities.   The soft tissues are within normal limits.  IMPRESSION: *Right lung lower lobe pneumonia. Follow-up to clearing is recommended.  PROCEDURES:  Critical Care performed: No  Procedures None   MEDICATIONS ORDERED IN ED: Medications - No data to display   IMPRESSION / MDM / ASSESSMENT AND PLAN / ED COURSE  I reviewed the triage vital signs and the nursing notes.                              Differential diagnosis includes, but is not limited to community acquired pneumonia, PCP pneumonia, COVID-19, influenza A, influenza B, RSV  Patient's presentation is most consistent with acute  complicated illness / injury requiring diagnostic workup.  Respiratory panel for COVID, influenza A, influenza B, and RSV are negative. patient's chest x-ray showed heterogeneous opacities in the right lower lobe consistent with pneumonia.  This does not look like a PCP pneumonia that is common with history of HIV. Chest x-ray was independently reviewed and interpreted by me as well as the radiologist. I agree with the radiologist's report.   She is immunocompromised so I prescribed her Augmentin 875 mg p.o. twice daily x 5 days and doxycycline 100 mg p.o. twice daily x 5 days sent to [facility].  I instructed her to take all of the antibiotics.  She should follow-up with her PCP in 1 to 2 days or in the emergency department if there are any new  or worsening symptoms.  We also discussed the need to bring her kids for evaluation and treatment if they develop the same or similar symptoms.   FINAL CLINICAL IMPRESSION(S) / ED DIAGNOSES   Final diagnoses:  Pneumonia of right lower lobe due to infectious organism     Rx / DC Orders   ED Discharge Orders          Ordered    amoxicillin -clavulanate (AUGMENTIN) 875-125 MG tablet  2 times daily        11/24/23 1923    doxycycline (VIBRA-TABS) 100 MG tablet  2 times daily        11/24/23 1923             Note:  This document was prepared using Dragon voice recognition software and may include unintentional dictation errors.    Thomasenia Flesher, PA-C 11/24/23 1931    Twilla Galea, MD 11/24/23 [phone]

## 2023-11-24 NOTE — ED Triage Notes (Signed)
 Pt to ED for dry cough since yesterday. Pt coughing a lot in triage. No sore throat. Has runny nose and body aches also. Pt has her 4 children with her.

## 2023-11-24 NOTE — Discharge Instructions (Addendum)
 We believe that your symptoms are caused today by pneumonia, an infection in your lung(s).  Fortunately you should start to improve quickly after taking your antibiotics.  Please take the full course of antibiotics as prescribed and drink plenty of fluids.    Follow up with your doctor within 1-2 days.  If you develop any new or worsening symptoms, including but not limited to fever in spite of taking over-the-counter ibuprofen  and/or Tylenol , persistent vomiting, worsening shortness of breath, or other symptoms that concern you, please return to the Emergency Department immediately.    Pneumonia Pneumonia is an infection of the lungs.  CAUSES Pneumonia may be caused by bacteria or a virus. Usually, these infections are caused by breathing infectious particles into the lungs (respiratory tract). SIGNS AND SYMPTOMS  Cough. Fever. Chest pain. Increased rate of breathing. Wheezing. Mucus production. DIAGNOSIS  If you have the common symptoms of pneumonia, your health care provider will typically confirm the diagnosis with a chest X-ray. The X-ray will show an abnormality in the lung (pulmonary infiltrate) if you have pneumonia. Other tests of your blood, urine, or sputum may be done to find the specific cause of your pneumonia. Your health care provider may also do tests (blood gases or pulse oximetry) to see how well your lungs are working. TREATMENT  Some forms of pneumonia may be spread to other people when you cough or sneeze. You may be asked to wear a mask before and during your exam. Pneumonia that is caused by bacteria is treated with antibiotic medicine. Pneumonia that is caused by the influenza virus may be treated with an antiviral medicine. Most other viral infections must run their course. These infections will not respond to antibiotics.  HOME CARE INSTRUCTIONS  Cough suppressants may be used if you are losing too much rest. However, coughing protects you by clearing your lungs. You  should avoid using cough suppressants if you can. Your health care provider may have prescribed medicine if he or she thinks your pneumonia is caused by bacteria or influenza. Finish your medicine even if you start to feel better. Your health care provider may also prescribe an expectorant. This loosens the mucus to be coughed up. Take medicines only as directed by your health care provider. Do not smoke. Smoking is a common cause of bronchitis and can contribute to pneumonia. If you are a smoker and continue to smoke, your cough may last several weeks after your pneumonia has cleared. A cold steam vaporizer or humidifier in your room or home may help loosen mucus. Coughing is often worse at night. Sleeping in a semi-upright position in a recliner or using a couple pillows under your head will help with this. Get rest as you feel it is needed. Your body will usually let you know when you need to rest. PREVENTION A pneumococcal shot (vaccine) is available to prevent a common bacterial cause of pneumonia. This is usually suggested for: People over 51 years old. Patients on chemotherapy. People with chronic lung problems, such as bronchitis or emphysema. People with immune system problems. If you are over 65 or have a high risk condition, you may receive the pneumococcal vaccine if you have not received it before. In some countries, a routine influenza vaccine is also recommended. This vaccine can help prevent some cases of pneumonia. You may be offered the influenza vaccine as part of your care. If you smoke, it is time to quit. You may receive instructions on how to stop smoking. Your  health care provider can provide medicines and counseling to help you quit. SEEK MEDICAL CARE IF: You have a fever. SEEK IMMEDIATE MEDICAL CARE IF:  Your illness becomes worse. This is especially true if you are elderly or weakened from any other disease. You cannot control your cough with suppressants and are losing  sleep. You begin coughing up blood. You develop pain which is getting worse or is uncontrolled with medicines. Any of the symptoms which initially brought you in for treatment are getting worse rather than better. You develop shortness of breath or chest pain. MAKE SURE YOU:  Understand these instructions. Will watch your condition. Will get help right away if you are not doing well or get worse.

## 2023-11-25 ENCOUNTER — Ambulatory Visit: Payer: Self-pay | Admitting: Internal Medicine

## 2023-12-10 ENCOUNTER — Encounter: Payer: Self-pay | Admitting: Internal Medicine

## 2023-12-10 ENCOUNTER — Ambulatory Visit: Payer: Self-pay | Admitting: Internal Medicine

## 2023-12-10 ENCOUNTER — Other Ambulatory Visit: Payer: Self-pay

## 2023-12-10 VITALS — BP 118/72 | HR 77 | Resp 16 | Ht 64.0 in | Wt 207.0 lb

## 2023-12-10 DIAGNOSIS — Z30019 Encounter for initial prescription of contraceptives, unspecified: Secondary | ICD-10-CM

## 2023-12-10 DIAGNOSIS — Z79899 Other long term (current) drug therapy: Secondary | ICD-10-CM

## 2023-12-10 DIAGNOSIS — Z0000 Encounter for general adult medical examination without abnormal findings: Secondary | ICD-10-CM

## 2023-12-10 DIAGNOSIS — T8332XA Displacement of intrauterine contraceptive device, initial encounter: Secondary | ICD-10-CM

## 2023-12-10 DIAGNOSIS — B20 Human immunodeficiency virus [HIV] disease: Secondary | ICD-10-CM

## 2023-12-10 MED ORDER — NORETHIN ACE-ETH ESTRAD-FE 1-20 MG-MCG PO TABS
1.0000 | ORAL_TABLET | Freq: Every day | ORAL | 11 refills | Status: AC
Start: 2023-12-10 — End: ?

## 2023-12-10 MED ORDER — BICTEGRAVIR-EMTRICITAB-TENOFOV 50-200-25 MG PO TABS: 1.0000 | ORAL_TABLET | Freq: Every day | ORAL | 11 refills | Status: AC

## 2023-12-10 NOTE — Progress Notes (Signed)
 Patient ID: Terri Padilla, female   DOB: 26-Nov-1980, 43 y.o.   MRN: [id number]  HPI Tyronza is a 43yo F with HIV disease, biktarvy . Well controlled, not missing any doses of her medications. She is Going to [place] for 2.5 months to come back in August 12th.   She reports that roughly 3 weeks ago, she was treated for pneumonia, feeling better now- completed 5 days of abtx. Denies fever, cough, or dyspnea on exertion. She had household contact + at time of pneumonia diagnosis.  Outpatient Encounter Medications as of 12/10/2023  Medication Sig   bictegravir-emtricitabine -tenofovir  AF (BIKTARVY ) 50-200-25 MG TABS tablet Take 1 tablet by mouth daily.   norethindrone-ethinyl estradiol -FE (JUNEL FE 1/20) 1-20 MG-MCG tablet Take 1 tablet by mouth daily.   No facility-administered encounter medications on file as of 12/10/2023.     Patient Active Problem List   Diagnosis Date Noted   Immunization counseling 04/16/2023   Health care maintenance 04/16/2023   Encounter for counseling regarding contraception 04/16/2023   Medication monitoring encounter 04/16/2023   Other fatigue 09/23/2022   HIV disease (HCC) 06/19/2022   Vaccine counseling 06/19/2022   Routine screening for STI (sexually transmitted infection) 06/19/2022   Malpositioned IUD 06/19/2022   History of cesarean delivery 03/25/2019   IUD (intrauterine device) in place 03/25/2019   Obesity in pregnancy 12/15/2018   UTI in pregnancy 11/15/2018   Language barrier 11/10/2018   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  HENT: Negative for congestion, sore throat, rhinorrhea, sneezing, trouble swallowing and sinus pressure.  Eyes: Negative for photophobia and visual disturbance.  Respiratory: Negative for cough, chest tightness, shortness of breath, wheezing and stridor.  Cardiovascular: Negative for chest pain, palpitations and leg swelling.  Gastrointestinal: Negative for nausea, vomiting, abdominal pain, diarrhea, constipation, blood in stool, abdominal distention and anal bleeding.  Genitourinary: Negative for dysuria, hematuria, flank pain and difficulty urinating.  Musculoskeletal: Negative for myalgias, back pain, joint swelling, arthralgias and gait problem.  Skin: Negative for color change, pallor, rash and wound.  Neurological: Negative for dizziness, tremors, weakness and light-headedness.  Hematological: Negative for adenopathy. Does not bruise/bleed easily.  Psychiatric/Behavioral: Negative for behavioral problems, confusion, sleep disturbance, dysphoric mood, decreased concentration and agitation.   Physical Exam   BP 118/72   Pulse 77   Resp 16   Ht 5\' 4"  (1.626 m)   Wt 207 lb (93.9 kg)   LMP 11/24/2023 (Approximate)   SpO2 99%   BMI 35.53 kg/m   Physical Exam  Constitutional:  oriented to person, place, and time. appears well-developed and well-nourished. No distress.  HENT: [place]/AT, PERRLA, no scleral icterus Mouth/Throat: Oropharynx is clear and moist. No oropharyngeal exudate.  Cardiovascular: Normal rate, regular rhythm and normal heart sounds. Exam reveals no gallop and no friction rub.  No murmur heard.  Pulmonary/Chest: Effort normal and breath sounds normal. No respiratory distress.  has no wheezes.  Neck =  supple, no nuchal rigidity Abdominal: Soft. Bowel sounds are normal.   exhibits no distension. There is no tenderness.  Lymphadenopathy: no cervical adenopathy. No axillary adenopathy Neurological: alert and oriented to person, place, and time.  Skin: Skin is warm and dry. No rash noted. No erythema.  Psychiatric: a normal mood and affect.  behavior is normal.   Lab Results  Component Value Date   CD4TCELL 33 04/16/2023   Lab Results  Component Value Date   CD4TABS 801 04/16/2023   CD4TABS 712 06/19/2022   CD4TABS 1,322 11/14/2020   Lab Results  Component Value Date   HIV1RNAQUANT 39 (H) 04/16/2023   Lab Results  Component Value Date   HEPBSAB NEG 03/04/2016   Lab Results  Component Value Date   LABRPR NON-REACTIVE 04/16/2023    CBC Lab Results  Component Value Date   WBC 5.8 04/16/2023   RBC 4.33 04/16/2023   HGB 11.9 04/16/2023   HCT 36.3 04/16/2023   PLT 341 04/16/2023   MCV 83.8 04/16/2023   MCH 27.5 04/16/2023   MCHC 32.8 04/16/2023   RDW 13.6 04/16/2023   LYMPHSABS 2.8 11/10/2021   MONOABS 0.5 11/10/2021   EOSABS 0.1 11/10/2021    BMET Lab Results  Component Value Date   NA 136 04/16/2023   K 4.3 04/16/2023   CL 104 04/16/2023   CO2 26 04/16/2023   GLUCOSE 91 04/16/2023   BUN 6 (L) 04/16/2023   CREATININE 0.56 04/16/2023   CALCIUM 8.7 04/16/2023   GFRNONAA >60 11/10/2021   GFRAA 127 11/14/2020      Assessment and Plan Hiv disease= we will do labs to check cd 4 count and viral load. For her upcoming trip, we will  Recommend a one time fill of 3 months of biktarvy  and bc pills.  Long term medication management = will check cr today   Health maintenance = will check ur sti screen and lipid profile  Birth control = will refill oral contraceptives  Rtc in 3-4 months to get flu vaccine See back in 3-4 months

## 2023-12-12 LAB — COMPLETE METABOLIC PANEL WITHOUT GFR
AG Ratio: 0.7 (calc) — ABNORMAL LOW (ref 1.0–2.5)
ALT: 17 U/L (ref 6–29)
AST: 16 U/L (ref 10–30)
Albumin: 3.7 g/dL (ref 3.6–5.1)
Alkaline phosphatase (APISO): 34 U/L (ref 31–125)
BUN: 7 mg/dL (ref 7–25)
CO2: 26 mmol/L (ref 20–32)
Calcium: 9.3 mg/dL (ref 8.6–10.2)
Chloride: 104 mmol/L (ref 98–110)
Creat: 0.72 mg/dL (ref 0.50–0.99)
Globulin: 5 g/dL — ABNORMAL HIGH (ref 1.9–3.7)
Glucose, Bld: 92 mg/dL (ref 65–99)
Potassium: 4.3 mmol/L (ref 3.5–5.3)
Sodium: 136 mmol/L (ref 135–146)
Total Bilirubin: 0.5 mg/dL (ref 0.2–1.2)
Total Protein: 8.7 g/dL — ABNORMAL HIGH (ref 6.1–8.1)

## 2023-12-12 LAB — CBC WITH DIFFERENTIAL/PLATELET
Absolute Lymphocytes: 2548 {cells}/uL (ref 850–3900)
Absolute Monocytes: 343 {cells}/uL (ref 200–950)
Basophils Absolute: 39 {cells}/uL (ref 0–200)
Basophils Relative: 0.8 %
Eosinophils Absolute: 29 {cells}/uL (ref 15–500)
Eosinophils Relative: 0.6 %
HCT: 37.2 % (ref 35.0–45.0)
Hemoglobin: 12.1 g/dL (ref 11.7–15.5)
MCH: 26.7 pg — ABNORMAL LOW (ref 27.0–33.0)
MCHC: 32.5 g/dL (ref 32.0–36.0)
MCV: 81.9 fL (ref 80.0–100.0)
MPV: 10.8 fL (ref 7.5–12.5)
Monocytes Relative: 7 %
Neutro Abs: 1940 {cells}/uL (ref 1500–7800)
Neutrophils Relative %: 39.6 %
Platelets: 389 10*3/uL (ref 140–400)
RBC: 4.54 10*6/uL (ref 3.80–5.10)
RDW: 13.1 % (ref 11.0–15.0)
Total Lymphocyte: 52 %
WBC: 4.9 10*3/uL (ref 3.8–10.8)

## 2023-12-12 LAB — T-HELPER CELLS (CD4) COUNT (NOT AT ARMC)
Absolute CD4: 854 {cells}/uL (ref 490–1740)
CD4 T Helper %: 35 % (ref 30–61)
Total lymphocyte count: 2438 {cells}/uL (ref 850–3900)

## 2023-12-12 LAB — HIV-1 RNA QUANT-NO REFLEX-BLD
HIV 1 RNA Quant: NOT DETECTED {copies}/mL
HIV-1 RNA Quant, Log: NOT DETECTED {Log_copies}/mL

## 2023-12-12 LAB — RPR: RPR Ser Ql: NONREACTIVE

## 2023-12-23 ENCOUNTER — Telehealth: Payer: Self-pay

## 2023-12-23 NOTE — Telephone Encounter (Signed)
 Called pt at both numbers list and both mailboxes are full.  Called to advise pt that her three month supply is read for pick up at [facility] on [address] for her up coming trip out of the country.

## 2024-02-29 ENCOUNTER — Other Ambulatory Visit: Payer: Self-pay

## 2024-02-29 ENCOUNTER — Encounter: Payer: Self-pay | Admitting: *Deleted

## 2024-02-29 ENCOUNTER — Emergency Department
Admission: EM | Admit: 2024-02-29 | Discharge: 2024-02-29 | Disposition: A | Discharge status: Home / Self Care | Payer: Self-pay | Attending: Emergency Medicine | Admitting: Emergency Medicine

## 2024-02-29 DIAGNOSIS — N390 Urinary tract infection, site not specified: Secondary | ICD-10-CM | POA: Insufficient documentation

## 2024-02-29 DIAGNOSIS — Z21 Asymptomatic human immunodeficiency virus [HIV] infection status: Secondary | ICD-10-CM | POA: Insufficient documentation

## 2024-02-29 LAB — URINALYSIS, ROUTINE W REFLEX MICROSCOPIC
Bilirubin Urine: NEGATIVE
Glucose, UA: NEGATIVE mg/dL
Ketones, ur: NEGATIVE mg/dL
Nitrite: NEGATIVE
Protein, ur: 30 mg/dL — AB
RBC / HPF: >50 RBC/hpf (ref 0–5)
Specific Gravity, Urine: 1.023 (ref 1.005–1.030)
WBC, UA: >50 WBC/hpf (ref 0–5)
pH: 6 (ref 5.0–8.0)

## 2024-02-29 LAB — WET PREP, GENITAL
Clue Cells Wet Prep HPF POC: NONE SEEN
Sperm: NONE SEEN
Trich, Wet Prep: NONE SEEN
WBC, Wet Prep HPF POC: <10 (ref <10)
Yeast Wet Prep HPF POC: NONE SEEN

## 2024-02-29 LAB — CHLAMYDIA/NGC RT PCR (ARMC ONLY)
Chlamydia Tr: NOT DETECTED
N gonorrhoeae: NOT DETECTED

## 2024-02-29 MED ORDER — CEPHALEXIN 500 MG PO CAPS
500.0000 mg | ORAL_CAPSULE | Freq: Once | ORAL | Status: AC
  Administered 2024-02-29: 500 mg via ORAL
  Filled 2024-02-29: qty 1

## 2024-02-29 MED ORDER — PHENAZOPYRIDINE HCL 200 MG PO TABS
200.0000 mg | ORAL_TABLET | Freq: Once | ORAL | Status: AC
  Administered 2024-02-29: 200 mg via ORAL
  Filled 2024-02-29: qty 1

## 2024-02-29 MED ORDER — PHENAZOPYRIDINE HCL 200 MG PO TABS: 200.0000 mg | ORAL_TABLET | Freq: Three times a day (TID) | ORAL | 0 refills | Status: AC | PRN

## 2024-02-29 MED ORDER — CEPHALEXIN 500 MG PO CAPS: 500.0000 mg | ORAL_CAPSULE | Freq: Three times a day (TID) | ORAL | 0 refills | Status: AC

## 2024-02-29 NOTE — ED Provider Notes (Signed)
 [facility] Provider Note    Event Date/Time   First MD Initiated Contact with Patient 02/29/24 2153     (approximate)  History   Chief Complaint: Vaginal Itching and Hematuria  HPI  Terri Padilla is a 43 y.o. female with a past medical history of HIV who presents to the emergency department for dysuria and blood in her urine.  According to the patient she states for the past few years she will occasionally have vaginal itching after intercourse however it usually goes away shortly after intercourse.  Patient states today she has developed a pain when she urinates and noticed some blood in her urine so she came to the emergency department.  Patient denies any fever.  No abdominal pain.  Denies any vaginal bleeding or discharge.  Physical Exam   Triage Vital Signs: ED Triage Vitals  Encounter Vitals Group     BP 02/29/24 2123 120/79     Girls Systolic BP Percentile --      Girls Diastolic BP Percentile --      Boys Systolic BP Percentile --      Boys Diastolic BP Percentile --      Pulse Rate 02/29/24 2123 90     Resp 02/29/24 2123 18     Temp 02/29/24 2123 98.3 F (36.8 C)     Temp Source 02/29/24 2123 Oral     SpO2 02/29/24 2123 99 %     Weight 02/29/24 2121 209 lb 7 oz (95 kg)     Height 02/29/24 2121 5' 5 (1.651 m)     Head Circumference --      Peak Flow --      Pain Score 02/29/24 2120 10     Pain Loc --      Pain Education --      Exclude from Growth Chart --     Most recent vital signs: Vitals:   02/29/24 2123  BP: 120/79  Pulse: 90  Resp: 18  Temp: 98.3 F (36.8 C)  SpO2: 99%    General: Awake, no distress.  CV:  Good peripheral perfusion.   Resp:  Normal effort.  Abd:  No distention.   ED Results / Procedures / Treatments    MEDICATIONS ORDERED IN ED: Medications - No data to display   IMPRESSION / MDM / ASSESSMENT AND PLAN / ED COURSE  I reviewed the triage vital signs and the nursing notes.  Patient's  presentation is most consistent with acute presentation with potential threat to life or bodily function.  Patient presents to the emergency department for blood in her urine and painful urination.  Patient states since this morning she has been experiencing a burning sensation when she urinates feels like she needs to urinate often but only a small amount of urine comes out with a burning sensation.  Patient states that is very uncomfortable and she feels like she does not want to urinate to avoid the pain.  Patient's wet prep today is negative, STD testing was sent as a precaution.  Patient's urinalysis however shows greater than 50 red cells white cells as well as some bacteria.  Highly suspect urinary tract infection.  Will place the patient on Pyridium , Keflex  and send a urine culture.  Patient agreeable to plan of care and will follow-up with her doctor.  FINAL CLINICAL IMPRESSION(S) / ED DIAGNOSES   Urinary tract infection    Note:  This document was prepared using Dragon voice recognition software and may  include unintentional dictation errors.   Dorothyann Drivers, MD 02/29/24 2208

## 2024-02-29 NOTE — ED Triage Notes (Signed)
 Pt ambulatory to triage.  Pt has vaginal itching with hematuria.  Sx worsened after intercourse. No vag bleeding or vag discharge.  Pt alert

## 2024-03-03 LAB — URINE CULTURE: Culture: 30000 — AB

## 2024-03-10 ENCOUNTER — Other Ambulatory Visit: Payer: Self-pay

## 2024-03-10 ENCOUNTER — Encounter: Payer: Self-pay | Admitting: Internal Medicine

## 2024-03-10 ENCOUNTER — Ambulatory Visit: Payer: Self-pay | Admitting: Internal Medicine

## 2024-03-10 ENCOUNTER — Ambulatory Visit: Payer: Self-pay

## 2024-03-10 VITALS — BP 115/83 | HR 75 | Temp 97.8°F | Wt 213.0 lb

## 2024-03-10 DIAGNOSIS — Z0000 Encounter for general adult medical examination without abnormal findings: Secondary | ICD-10-CM

## 2024-03-10 DIAGNOSIS — Z23 Encounter for immunization: Secondary | ICD-10-CM

## 2024-03-10 DIAGNOSIS — B20 Human immunodeficiency virus [HIV] disease: Secondary | ICD-10-CM

## 2024-03-10 DIAGNOSIS — Z79899 Other long term (current) drug therapy: Secondary | ICD-10-CM

## 2024-03-10 NOTE — Progress Notes (Signed)
 Patient ID: Terri Padilla, female   DOB: Mar 26, 1981, 43 y.o.   MRN: [id number]  HPI Ezri is a 43yo F with history of well controlled hiv disease. Currently takes biktarvy  daily. She reports that she went to ED for uti symptoms, treated for e.coli uti and also reported having reaction to lubricant used for sex. They made recommendations to switch to water lubricant.  She reports otherwise being in good state of health. She returned from her trip to see her family in [place]. She reports Gained some weight when she went home.  Outpatient Encounter Medications as of 03/10/2024  Medication Sig   bictegravir-emtricitabine -tenofovir  AF (BIKTARVY ) 50-200-25 MG TABS tablet Take 1 tablet by mouth daily.   cephALEXin  (KEFLEX ) 500 MG capsule Take 1 capsule (500 mg total) by mouth 3 (three) times daily.   norethindrone-ethinyl estradiol -FE (JUNEL FE 1/20) 1-20 MG-MCG tablet Take 1 tablet by mouth daily.   phenazopyridine  (PYRIDIUM ) 200 MG tablet Take 1 tablet (200 mg total) by mouth 3 (three) times daily as needed for pain.   No facility-administered encounter medications on file as of 03/10/2024.     Patient Active Problem List   Diagnosis Date Noted   Immunization counseling 04/16/2023   Health care maintenance 04/16/2023   Encounter for counseling regarding contraception 04/16/2023   Medication monitoring encounter 04/16/2023   Other fatigue 09/23/2022   HIV disease (HCC) 06/19/2022   Vaccine counseling 06/19/2022   Routine screening for STI (sexually transmitted infection) 06/19/2022   Malpositioned IUD 06/19/2022   History of cesarean delivery 03/25/2019   IUD (intrauterine device) in place 03/25/2019   Obesity in pregnancy 12/15/2018   UTI in pregnancy 11/15/2018   Language barrier 11/10/2018   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 No longer having vaginal discomfort or itching-trying water lubricant Physical Exam   BP 115/83   Pulse 75   Temp 97.8 F (36.6 C) (Oral)   Wt 213 lb (96.6 kg)   LMP 02/01/2024 (Approximate)   SpO2 98%   BMI 35.45 kg/m   Physical Exam  Constitutional:  oriented to person, place, and time. appears well-developed and well-nourished. No distress.  HENT: [place]/AT, PERRLA, no scleral icterus Mouth/Throat: Oropharynx is clear and moist. No oropharyngeal exudate.  Cardiovascular: Normal rate, regular rhythm and normal heart sounds. Exam reveals no gallop and no friction rub.  No murmur heard.  Pulmonary/Chest: Effort normal and breath sounds normal. No respiratory distress.  has no wheezes.  Neck = supple, no nuchal rigidity Abdominal: Soft. Bowel sounds are normal.  exhibits no distension. There is no tenderness.  Lymphadenopathy: no cervical adenopathy. No axillary adenopathy Neurological: alert and oriented to person, place, and time.  Skin: Skin is warm and dry. No rash noted. No erythema.  Psychiatric: a normal mood and affect.  behavior is normal.  Lab Results  Component Value Date   CD4TCELL 35 12/10/2023   Lab Results  Component Value Date   CD4TABS 801 04/16/2023   CD4TABS 712 06/19/2022   CD4TABS 1,322 11/14/2020   Lab Results  Component Value Date   HIV1RNAQUANT NOT DETECTED 12/10/2023   Lab Results  Component Value Date   HEPBSAB NEG 03/04/2016   Lab  Results  Component Value Date   LABRPR NON-REACTIVE 12/10/2023    CBC Lab Results  Component Value Date   WBC 4.9 12/10/2023   RBC 4.54 12/10/2023   HGB 12.1 12/10/2023   HCT 37.2 12/10/2023   PLT 389 12/10/2023   MCV 81.9 12/10/2023   MCH 26.7 (L) 12/10/2023   MCHC 32.5 12/10/2023   RDW 13.1 12/10/2023   LYMPHSABS 2.8 11/10/2021   MONOABS 0.5 11/10/2021   EOSABS 29 12/10/2023    BMET Lab Results  Component Value Date   NA 136 12/10/2023   K 4.3 12/10/2023   CL 104 12/10/2023   CO2 26 12/10/2023   GLUCOSE 92 12/10/2023   BUN 7 12/10/2023   CREATININE 0.72 12/10/2023   CALCIUM 9.3 12/10/2023   GFRNONAA >60 11/10/2021   GFRAA 127 11/14/2020      Assessment and Plan  HIV disease= will check cd 4 count and HIV viral load along with  Annual labs to see that she is well controlled. Will give refills on biktarvy   Long term medication management = will check cr  Sexually active = will check rpr and urine cytology for sti  Health maintenance= will need pap smear; will plan to  Ref gynecology; also give Hpv and pcv 20 vaccine; plan to check lipid profile

## 2024-03-11 LAB — T-HELPER CELL (CD4) - (RCID CLINIC ONLY)
CD4 % Helper T Cell: 36 % (ref 33–65)
CD4 T Cell Abs: 952 /uL (ref 400–1790)

## 2024-03-12 LAB — RPR: RPR Ser Ql: NONREACTIVE

## 2024-03-12 LAB — COMPLETE METABOLIC PANEL WITHOUT GFR
AG Ratio: 0.8 (calc) — ABNORMAL LOW (ref 1.0–2.5)
ALT: 19 U/L (ref 6–29)
AST: 15 U/L (ref 10–30)
Albumin: 3.8 g/dL (ref 3.6–5.1)
Alkaline phosphatase (APISO): 43 U/L (ref 31–125)
BUN: 11 mg/dL (ref 7–25)
CO2: 27 mmol/L (ref 20–32)
Calcium: 9.2 mg/dL (ref 8.6–10.2)
Chloride: 105 mmol/L (ref 98–110)
Creat: 0.63 mg/dL (ref 0.50–0.99)
Globulin: 4.5 g/dL — ABNORMAL HIGH (ref 1.9–3.7)
Glucose, Bld: 90 mg/dL (ref 65–99)
Potassium: 4.1 mmol/L (ref 3.5–5.3)
Sodium: 137 mmol/L (ref 135–146)
Total Bilirubin: 0.4 mg/dL (ref 0.2–1.2)
Total Protein: 8.3 g/dL — ABNORMAL HIGH (ref 6.1–8.1)

## 2024-03-12 LAB — CBC WITH DIFFERENTIAL/PLATELET
Absolute Lymphocytes: 2829 {cells}/uL (ref 850–3900)
Absolute Monocytes: 442 {cells}/uL (ref 200–950)
Basophils Absolute: 51 {cells}/uL (ref 0–200)
Basophils Relative: 0.8 %
Eosinophils Absolute: 51 {cells}/uL (ref 15–500)
Eosinophils Relative: 0.8 %
HCT: 38.3 % (ref 35.0–45.0)
Hemoglobin: 12.2 g/dL (ref 11.7–15.5)
MCH: 26.3 pg — ABNORMAL LOW (ref 27.0–33.0)
MCHC: 31.9 g/dL — ABNORMAL LOW (ref 32.0–36.0)
MCV: 82.7 fL (ref 80.0–100.0)
MPV: 10.6 fL (ref 7.5–12.5)
Monocytes Relative: 6.9 %
Neutro Abs: 3027 {cells}/uL (ref 1500–7800)
Neutrophils Relative %: 47.3 %
Platelets: 367 Thousand/uL (ref 140–400)
RBC: 4.63 Million/uL (ref 3.80–5.10)
RDW: 13.2 % (ref 11.0–15.0)
Total Lymphocyte: 44.2 %
WBC: 6.4 Thousand/uL (ref 3.8–10.8)

## 2024-03-12 LAB — LIPID PANEL
Cholesterol: 165 mg/dL (ref <200)
HDL: 46 mg/dL — ABNORMAL LOW (ref >=50)
LDL Cholesterol (Calc): 101 mg/dL — ABNORMAL HIGH
Non-HDL Cholesterol (Calc): 119 mg/dL (ref <130)
Total CHOL/HDL Ratio: 3.6 (calc) (ref <5.0)
Triglycerides: 89 mg/dL (ref <150)

## 2024-03-12 LAB — HIV-1 RNA QUANT-NO REFLEX-BLD
HIV 1 RNA Quant: NOT DETECTED {copies}/mL
HIV-1 RNA Quant, Log: NOT DETECTED {Log_copies}/mL

## 2024-03-15 LAB — URINE CYTOLOGY ANCILLARY ONLY
Chlamydia: NEGATIVE
Comment: NEGATIVE
Comment: NORMAL
Neisseria Gonorrhea: NEGATIVE

## 2024-05-19 ENCOUNTER — Ambulatory Visit: Payer: Self-pay | Admitting: Internal Medicine

## 2024-05-19 ENCOUNTER — Other Ambulatory Visit: Payer: Self-pay

## 2024-05-19 VITALS — BP 111/77 | HR 74 | Temp 98.4°F | Resp 16 | Wt 217.4 lb

## 2024-05-19 DIAGNOSIS — B20 Human immunodeficiency virus [HIV] disease: Secondary | ICD-10-CM | POA: Diagnosis not present

## 2024-05-19 DIAGNOSIS — N941 Unspecified dyspareunia: Secondary | ICD-10-CM

## 2024-05-19 DIAGNOSIS — Z79899 Other long term (current) drug therapy: Secondary | ICD-10-CM | POA: Diagnosis not present

## 2024-05-19 DIAGNOSIS — Z113 Encounter for screening for infections with a predominantly sexual mode of transmission: Secondary | ICD-10-CM

## 2024-05-19 MED ORDER — FLUCONAZOLE 150 MG PO TABS: 150.0000 mg | ORAL_TABLET | ORAL | 0 refills | Status: AC

## 2024-05-19 NOTE — Progress Notes (Signed)
 Patient: Terri Padilla  DOB: December 24, 1980 MRN: [id number] PCP: System, Provider Not In    Chief Complaint  Patient presents with   Follow-up    Vaginal pain -- pt reports pain with sexual intercourse.      Patient Active Problem List   Diagnosis Date Noted   Immunization counseling 04/16/2023   Health care maintenance 04/16/2023   Encounter for counseling regarding contraception 04/16/2023   Medication monitoring encounter 04/16/2023   Other fatigue 09/23/2022   HIV disease (HCC) 06/19/2022   Vaccine counseling 06/19/2022   Routine screening for STI (sexually transmitted infection) 06/19/2022   Malpositioned IUD 06/19/2022   History of cesarean delivery 03/25/2019   IUD (intrauterine device) in place 03/25/2019   Obesity in pregnancy 12/15/2018   UTI in pregnancy 11/15/2018   Language barrier 11/10/2018   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 with HIV on biktarvy  presents for dyspareunia. She is established for gynecology. She note pain and pruritis after sex. NO change in discharge. No new sexual partners. No rashes  Review of Systems  All other systems reviewed and are negative.   Past Medical History:  Diagnosis Date   HIV (human immunodeficiency virus infection) (HCC)     Outpatient Medications Prior to Visit  Medication Sig Dispense Refill   bictegravir-emtricitabine -tenofovir  AF (BIKTARVY ) 50-200-25 MG TABS tablet Take 1 tablet by mouth daily. 30 tablet 11   norethindrone-ethinyl estradiol -FE (JUNEL FE 1/20) 1-20 MG-MCG tablet Take 1 tablet by mouth daily. 30 tablet 11   phenazopyridine  (PYRIDIUM ) 200 MG tablet Take 1 tablet (200 mg total) by mouth 3 (three) times daily as needed  for pain. 20 tablet 0   cephALEXin  (KEFLEX ) 500 MG capsule Take 1 capsule (500 mg total) by mouth 3 (three) times daily. (Patient not taking: Reported on 05/19/2024) 30 capsule 0   No facility-administered medications prior to visit.     Allergies  Allergen Reactions   Chloroquine Itching    Per Interpreter: Nivaquine (Chloroquine)    Social History   Tobacco Use   Smoking status: Never   Smokeless tobacco: Never  Vaping Use   Vaping status: Never Used  Substance Use Topics   Alcohol use: No   Drug use: No    Family History  Problem Relation Age of Onset   Arthritis Mother    Down syndrome Daughter    Asthma Neg Hx    Alcohol abuse Neg Hx    Birth defects Neg Hx    Cancer Neg Hx    COPD Neg Hx    Depression Neg Hx    Diabetes Neg Hx    Drug abuse Neg Hx    Early death Neg Hx    Hearing loss Neg Hx    Heart disease Neg Hx    Hyperlipidemia Neg Hx    Hypertension Neg Hx    Kidney disease Neg Hx    Learning disabilities Neg Hx    Mental illness Neg Hx    Mental retardation Neg Hx    Miscarriages / Stillbirths Neg Hx    Stroke Neg Hx    Vision loss Neg Hx    Varicose Veins Neg Hx     Objective:   Vitals:   05/19/24 1429  BP:  111/77  Pulse: 74  Resp: 16  Temp: 98.4 F (36.9 C)  TempSrc: Oral  SpO2: 97%  Weight: 217 lb 6 oz (98.6 kg)   Body mass index is 36.17 kg/m.  Physical Exam Constitutional:      Appearance: Normal appearance.  HENT:     Head: Normocephalic and atraumatic.     Right Ear: Tympanic membrane normal.     Left Ear: Tympanic membrane normal.     Nose: Nose normal.     Mouth/Throat:     Mouth: Mucous membranes are moist.  Eyes:     Extraocular Movements: Extraocular movements intact.     Conjunctiva/sclera: Conjunctivae normal.     Pupils: Pupils are equal, round, and reactive to light.  Cardiovascular:     Rate and Rhythm: Normal rate and regular rhythm.     Heart sounds: No murmur heard.    No friction rub. No gallop.   Pulmonary:     Effort: Pulmonary effort is normal.     Breath sounds: Normal breath sounds.  Abdominal:     General: Abdomen is flat.     Palpations: Abdomen is soft.  Musculoskeletal:        General: Normal range of motion.  Skin:    General: Skin is warm and dry.  Neurological:     General: No focal deficit present.     Mental Status: She is alert and oriented to person, place, and time.  Psychiatric:        Mood and Affect: Mood normal.     Lab Results: Lab Results  Component Value Date   WBC 6.4 03/10/2024   HGB 12.2 03/10/2024   HCT 38.3 03/10/2024   MCV 82.7 03/10/2024   PLT 367 03/10/2024    Lab Results  Component Value Date   CREATININE 0.63 03/10/2024   BUN 11 03/10/2024   NA 137 03/10/2024   K 4.1 03/10/2024   CL 105 03/10/2024   CO2 27 03/10/2024    Lab Results  Component Value Date   ALT 19 03/10/2024   AST 15 03/10/2024   ALKPHOS 38 11/10/2021   BILITOT 0.4 03/10/2024     Assessment & Plan:  #Dyspareunia #HIV on Biktarvy  Pt reports pain and pruritis after sex. She has seen gyn in since last visit No new partner ince lv Plan: GC oral and cervical UA Fluconazole  for possible yeast infection Continue to follow with GYN F/U for HIV management on 11/19  Loney Stank, MD [facility]   05/19/24  2:33 PM I have personally spent 32 minutes involved in face-to-face and non-face-to-face activities for this patient on the day of the visit. Professional time spent includes the following activities: Preparing to see the patient (review of tests), Obtaining and/or reviewing separately obtained history (admission/discharge record), Performing a medically appropriate examination and/or evaluation , Ordering medications/tests/procedures, referring and communicating with other health care professionals, Documenting clinical information in the EMR, Independently interpreting results (not separately  reported), Communicating results to the patient/family/caregiver, Counseling and educating the patient/family/caregiver and Care coordination (not separately reported).

## 2024-05-20 LAB — URINALYSIS, ROUTINE W REFLEX MICROSCOPIC
Bilirubin Urine: NEGATIVE
Glucose, UA: NEGATIVE
Hyaline Cast: NONE SEEN /LPF
Ketones, ur: NEGATIVE
Nitrite: NEGATIVE
Protein, ur: NEGATIVE
Specific Gravity, Urine: 1.024 (ref 1.001–1.035)
pH: 6 (ref 5.0–8.0)

## 2024-05-20 LAB — CERVICOVAGINAL ANCILLARY ONLY
Bacterial Vaginitis (gardnerella): NEGATIVE
Candida Glabrata: NEGATIVE
Candida Vaginitis: NEGATIVE
Chlamydia: NEGATIVE
Comment: NEGATIVE
Comment: NEGATIVE
Comment: NEGATIVE
Comment: NEGATIVE
Comment: NEGATIVE
Comment: NORMAL
Neisseria Gonorrhea: NEGATIVE
Trichomonas: NEGATIVE

## 2024-05-20 LAB — CYTOLOGY, (ORAL, ANAL, URETHRAL) ANCILLARY ONLY
Chlamydia: NEGATIVE
Comment: NEGATIVE
Comment: NORMAL
Neisseria Gonorrhea: NEGATIVE

## 2024-05-20 LAB — MICROSCOPIC MESSAGE

## 2024-05-23 ENCOUNTER — Emergency Department: Payer: Self-pay

## 2024-05-23 ENCOUNTER — Other Ambulatory Visit: Payer: Self-pay

## 2024-05-23 DIAGNOSIS — J069 Acute upper respiratory infection, unspecified: Secondary | ICD-10-CM | POA: Insufficient documentation

## 2024-05-23 DIAGNOSIS — Z21 Asymptomatic human immunodeficiency virus [HIV] infection status: Secondary | ICD-10-CM | POA: Diagnosis not present

## 2024-05-23 DIAGNOSIS — R059 Cough, unspecified: Secondary | ICD-10-CM | POA: Diagnosis present

## 2024-05-23 NOTE — ED Triage Notes (Signed)
 Pt reports cough congestion and body aches since yesterday

## 2024-05-24 ENCOUNTER — Emergency Department
Admission: EM | Admit: 2024-05-24 | Discharge: 2024-05-24 | Disposition: A | Discharge status: Home / Self Care | Payer: Self-pay | Attending: Emergency Medicine | Admitting: Emergency Medicine

## 2024-05-24 DIAGNOSIS — J069 Acute upper respiratory infection, unspecified: Secondary | ICD-10-CM

## 2024-05-24 LAB — RESP PANEL BY RT-PCR (RSV, FLU A&B, COVID)  RVPGX2
Influenza A by PCR: NEGATIVE
Influenza B by PCR: NEGATIVE
Resp Syncytial Virus by PCR: NEGATIVE
SARS Coronavirus 2 by RT PCR: NEGATIVE

## 2024-05-24 MED ORDER — BENZONATATE 100 MG PO CAPS: 100.0000 mg | ORAL_CAPSULE | Freq: Three times a day (TID) | ORAL | 0 refills | Status: AC | PRN

## 2024-05-24 MED ORDER — NAPROXEN 500 MG PO TABS
500.0000 mg | ORAL_TABLET | Freq: Once | ORAL | Status: AC
  Administered 2024-05-24: 500 mg via ORAL
  Filled 2024-05-24: qty 1

## 2024-05-24 MED ORDER — BENZONATATE 100 MG PO CAPS
200.0000 mg | ORAL_CAPSULE | Freq: Once | ORAL | Status: AC
  Administered 2024-05-24: 200 mg via ORAL
  Filled 2024-05-24: qty 2

## 2024-05-24 MED ORDER — NAPROXEN 500 MG PO TABS
500.0000 mg | ORAL_TABLET | Freq: Two times a day (BID) | ORAL | 0 refills | Status: AC
Start: 2024-05-24 — End: ?

## 2024-05-24 MED ORDER — PREDNISONE 20 MG PO TABS
40.0000 mg | ORAL_TABLET | ORAL | Status: AC
  Administered 2024-05-24: 40 mg via ORAL
  Filled 2024-05-24: qty 2

## 2024-05-24 NOTE — ED Provider Notes (Signed)
   [facility] Provider Note    Event Date/Time   First MD Initiated Contact with Patient 05/24/24 0229     (approximate)   History   Cough   HPI  Terri Padilla is a 43 y.o. female with a history of HIV who comes ED complaining of cough congestion body aches since yesterday.  She is also had sore throat for 2 to 3 days.  Eating and drinking okay.  No vomiting or diarrhea.  No fever headache or neck stiffness.     Physical Exam   Triage Vital Signs: ED Triage Vitals  Encounter Vitals Group     BP 05/23/24 2332 124/75     Girls Systolic BP Percentile --      Girls Diastolic BP Percentile --      Boys Systolic BP Percentile --      Boys Diastolic BP Percentile --      Pulse Rate 05/23/24 2332 94     Resp 05/23/24 2332 18     Temp 05/23/24 2332 98.6 F (37 C)     Temp src --      SpO2 05/23/24 2332 100 %     Weight --      Height --      Head Circumference --      Peak Flow --      Pain Score 05/23/24 2330 5     Pain Loc --      Pain Education --      Exclude from Growth Chart --     Most recent vital signs: Vitals:   05/23/24 2332  BP: 124/75  Pulse: 94  Resp: 18  Temp: 98.6 F (37 C)  SpO2: 100%    General: Awake, no distress.  CV:  Good peripheral perfusion.  Regular rate rhythm Resp:  Normal effort.  Clear lungs Abd:  No distention.  Soft nontender Other:  No lower extremity edema or calf tenderness.  Symmetric calf circumference   ED Results / Procedures / Treatments   Labs (all labs ordered are listed, but only abnormal results are displayed) Labs Reviewed  RESP PANEL BY RT-PCR (RSV, FLU A&B, COVID)  [id number]     RADIOLOGY Chest x-ray interpreted by me, appears normal.  Radiology report reviewed   PROCEDURES:  Procedures   MEDICATIONS ORDERED IN ED: Medications  naproxen (NAPROSYN) tablet 500 mg (has no administration in time range)  predniSONE (DELTASONE) tablet 40 mg (has no administration in time  range)  benzonatate (TESSALON) capsule 200 mg (has no administration in time range)     IMPRESSION / MDM / ASSESSMENT AND PLAN / ED COURSE  I reviewed the triage vital signs and the nursing notes.                              Differential diagnosis includes, but is not limited to, COVID, influenza, other viral URI, pneumonia   Patient presents with symptoms of URI.  She is nontoxic, normal vital signs, reassuring exam.  Will treat supportively, stable for discharge.    FINAL CLINICAL IMPRESSION(S) / ED DIAGNOSES   Final diagnoses:  Viral URI with cough     Rx / DC Orders   ED Discharge Orders     None        Note:  This document was prepared using Dragon voice recognition software and may include unintentional dictation errors.   Viviann Pastor, MD 05/24/24 [phone]

## 2024-06-01 ENCOUNTER — Other Ambulatory Visit (HOSPITAL_COMMUNITY)
Admission: RE | Admit: 2024-06-01 | Discharge: 2024-06-01 | Disposition: A | Discharge status: Home / Self Care | Source: Ambulatory Visit | Attending: Infectious Diseases | Admitting: Infectious Diseases

## 2024-06-01 ENCOUNTER — Other Ambulatory Visit: Payer: Self-pay

## 2024-06-01 ENCOUNTER — Encounter: Payer: Self-pay | Admitting: Infectious Diseases

## 2024-06-01 ENCOUNTER — Ambulatory Visit: Payer: Self-pay | Admitting: Infectious Diseases

## 2024-06-01 VITALS — Wt 222.7 lb

## 2024-06-01 DIAGNOSIS — N941 Unspecified dyspareunia: Secondary | ICD-10-CM

## 2024-06-01 DIAGNOSIS — Z124 Encounter for screening for malignant neoplasm of cervix: Secondary | ICD-10-CM

## 2024-06-01 DIAGNOSIS — Z23 Encounter for immunization: Secondary | ICD-10-CM

## 2024-06-01 DIAGNOSIS — Z1231 Encounter for screening mammogram for malignant neoplasm of breast: Secondary | ICD-10-CM

## 2024-06-01 MED ORDER — ESTRADIOL 0.01 % VA CREA: TOPICAL_CREAM | VAGINAL | 12 refills | Status: AC

## 2024-06-01 NOTE — Patient Instructions (Addendum)
 Please schedule a follow up visit with Dr. Luiz sometime in late February   You will need your last HPV vaccine then   You have a yeast infection - please take your second dose of fluconazole  on Saturday.

## 2024-06-01 NOTE — Progress Notes (Signed)
 SUBJECTIVE    Silvia Hightower is a 43 y.o. female here for an pelvic exam and pap smear.   Discussed the use of AI scribe software for clinical note transcription with the patient, who gave verbal consent to proceed.  History of Present Illness   Maryclare Nydam is a 42 year old female who presents for a cervical cancer screening with Pap smear.  Her last cervical cancer screening was in April 2020, and prior to that, she had a test in 2017. She received her first dose of the HPV vaccine in August of this year and is due for her second dose today. She has not yet received her flu shot this year.  She experienced a cough and feeling unwell, prompting a visit to the emergency room about ten days ago due to concerns about pneumonia. No pneumonia was diagnosed. Her breathing is not good when walking or sleeping, similar to symptoms she experienced during a previous pneumonia episode. However, she feels better now compared to her emergency room visit.  She has two children, with the youngest being a five-year-old daughter. She confirms that she is not pregnant and is still having regular menstrual periods every month. There is no family history of breast cancer.       Past Medical History:  Diagnosis Date   HIV (human immunodeficiency virus infection) (HCC)     Gynecologic History: [id number]  No LMP recorded. (Menstrual status: Oral contraceptives). Contraception: OCP (estrogen/progesterone) Last Pap: 10-2018. Results were: normal Last Mammogram: never - ordered today.     Objective  Physical Exam  Constitutional: Well developed, well nourished, no acute distress. She is alert and oriented x3.  Pelvic: External genitalia is normal in appearance. The vagina is normal in appearance. The cervix is bulbous and easily visualized. No CMT, thick white vaginal discharge present.   Vitals:   06/01/24 1319  Weight: 222 lb 10.6 oz (101 kg)        Assessment &  Plan:      Cervical cancer screening with Pap smear - Due for cervical cancer screening with Pap smear. Last screening in April 2020 was normal. No evidence of HPV in previous tests. Screening is important for early detection of cervical cancer risk factors. - Performed Pap smear for cervical cancer screening  Breast cancer screening with screening mammogram - Due for breast cancer screening with mammogram. No family history of breast cancer. Screening typically starts around age 6 unless there is a family history. - Ordered screening mammogram at the breast center - Provided contact information for the breast center  Dyspareunia -  Started over the last 2 years and always with penetration. ?perimenopause/hormone changes vs OCP-associated vestibulodynia (on Junel daily) vs hypertonic pelvic floor. Anatomically she does have smaller introitus, however the discomfort has not always been present.  - ensure liberal water based lubrication with sex  - Will trial topical estrogen cream. If no relief over the next 3 months would refer to pelvic PT and possibly urogyn to assess. *Start after yeast infection clears  - Exam c/w vaginal yeast infection today with thick curdled white discharge - she did not take fluconazole  as prescribed previously since testing was negative. I asked her to take a dose today and repeat on Saturday.   Healthcare Maintenance -  2nd HPV today  Flu vaccine today  She needs hep b surface antibody to see if the one dose of hep b vaccine in 2023 was effective. If not  Orders Placed This Encounter  Procedures   MM Digital Screening    Standing Status:   Future    Expected Date:   06/01/2024    Expiration Date:   06/01/2025    Reason for Exam (SYMPTOM  OR DIAGNOSIS REQUIRED):   breast cancer screening    Is the patient pregnant?:   No    Preferred [facility]?:   [facility]   Flu vaccine trivalent PF, 6mos and older(Flulaval,Afluria,Fluarix,Fluzone)    HPV 9-valent vaccine,Recombinat    Meds ordered this encounter  Medications   estradiol  (ESTRACE ) 0.01 % CREA vaginal cream    Sig: Place 1 Applicatorful vaginally at bedtime for 7 days, THEN 1 Applicatorful 2 (two) times a week for 23 days. 1 applicator full 1-3 times a week for maintenance.    Dispense:  42.5 g    Refill:  12    Return in about 3 months (around 09/01/2024).   Corean Fireman, MSN, NP-C [facility]  [email or web address] Pager: [phone] Office: [phone] [facility] Main Line: [phone] *Secure Chat Communication Welcome

## 2024-06-02 ENCOUNTER — Ambulatory Visit: Payer: Self-pay | Admitting: Infectious Diseases

## 2024-06-02 LAB — CYTOLOGY - PAP
Adequacy: ABSENT
Diagnosis: NEGATIVE

## 2024-06-02 LAB — CERVICOVAGINAL ANCILLARY ONLY
Bacterial Vaginitis (gardnerella): NEGATIVE
Candida Glabrata: NEGATIVE
Candida Vaginitis: NEGATIVE
Chlamydia: NEGATIVE
Comment: NEGATIVE
Comment: NEGATIVE
Comment: NEGATIVE
Comment: NEGATIVE
Comment: NEGATIVE
Comment: NORMAL
Neisseria Gonorrhea: NEGATIVE
Trichomonas: NEGATIVE

## 2024-06-03 NOTE — Telephone Encounter (Signed)
 Spoke with Terri Padilla, relayed that pap was normal and can be repeated in 3 years. She is asking about mammo. Advised that referral has been placed and she should be receiving a call to schedule. Patient verbalized understanding and has no further questions.   Roshunda Keir, BSN, RN

## 2024-07-26 NOTE — Progress Notes (Signed)
 The 10-year ASCVD risk score (Arnett DK, et al., 2019) is: 1.3%   Values used to calculate the score:     Age: 44 years     Clinically relevant sex: Female     Is Non-Hispanic African American: Yes     Diabetic: No     Tobacco smoker: No     Systolic Blood Pressure: 135 mmHg     Is BP treated: No     HDL Cholesterol: 46 mg/dL     Total Cholesterol: 165 mg/dL  Duwaine Lowe, BSN, RN
